# Patient Record
Sex: Male | Born: 1955
Health system: Southern US, Community
[De-identification: ages and names within clinical notes are randomized; demographics above are authoritative.]

## PROBLEM LIST (undated history)

## (undated) DIAGNOSIS — K5790 Diverticulosis of intestine, part unspecified, without perforation or abscess without bleeding: Secondary | ICD-10-CM

## (undated) DIAGNOSIS — E785 Hyperlipidemia, unspecified: Secondary | ICD-10-CM

## (undated) DIAGNOSIS — J45909 Unspecified asthma, uncomplicated: Secondary | ICD-10-CM

## (undated) DIAGNOSIS — Z86010 Personal history of colonic polyps: Secondary | ICD-10-CM

## (undated) DIAGNOSIS — Z860101 Personal history of adenomatous and serrated colon polyps: Secondary | ICD-10-CM

## (undated) DIAGNOSIS — Q140 Congenital malformation of vitreous humor: Secondary | ICD-10-CM

## (undated) DIAGNOSIS — I4891 Unspecified atrial fibrillation: Secondary | ICD-10-CM

## (undated) DIAGNOSIS — I10 Essential (primary) hypertension: Secondary | ICD-10-CM

## (undated) HISTORY — DX: Personal history of colonic polyps: Z86.010

## (undated) HISTORY — PX: COLONOSCOPY: SHX174

## (undated) HISTORY — DX: Diverticulosis of intestine, part unspecified, without perforation or abscess without bleeding: K57.90

## (undated) HISTORY — DX: Hyperlipidemia, unspecified: E78.5

## (undated) HISTORY — DX: Personal history of adenomatous and serrated colon polyps: Z86.0101

## (undated) HISTORY — DX: Unspecified atrial fibrillation: I48.91

## (undated) HISTORY — DX: Congenital malformation of vitreous humor: Q14.0

---

## 2004-08-06 ENCOUNTER — Encounter: Payer: Self-pay | Admitting: Internal Medicine

## 2005-08-30 ENCOUNTER — Ambulatory Visit: Payer: Self-pay | Admitting: Internal Medicine

## 2005-09-06 ENCOUNTER — Ambulatory Visit: Payer: Self-pay | Admitting: Internal Medicine

## 2005-09-23 ENCOUNTER — Ambulatory Visit: Payer: Self-pay | Admitting: Internal Medicine

## 2005-09-30 ENCOUNTER — Ambulatory Visit: Payer: Self-pay | Admitting: Internal Medicine

## 2006-09-15 ENCOUNTER — Ambulatory Visit: Payer: Self-pay | Admitting: Internal Medicine

## 2006-09-15 LAB — CONVERTED CEMR LAB
ALT: 32 units/L (ref 0–40)
Albumin: 4.5 g/dL (ref 3.5–5.2)
Alkaline Phosphatase: 53 units/L (ref 39–117)
BUN: 17 mg/dL (ref 6–23)
Basophils Absolute: 0 10*3/uL (ref 0.0–0.1)
Basophils Relative: 0.7 % (ref 0.0–1.0)
CO2: 30 meq/L (ref 19–32)
Calcium: 9.4 mg/dL (ref 8.4–10.5)
Chol/HDL Ratio, serum: 3.5
Cholesterol: 153 mg/dL (ref 0–200)
Glomerular Filtration Rate, Af Am: 91 mL/min/{1.73_m2}
Hemoglobin: 17.2 g/dL — ABNORMAL HIGH (ref 13.0–17.0)
Lymphocytes Relative: 28.2 % (ref 12.0–46.0)
Monocytes Relative: 9.5 % (ref 3.0–11.0)
Neutrophils Relative %: 59.1 % (ref 43.0–77.0)
PSA: 0.17 ng/mL (ref 0.10–4.00)
Platelets: 203 10*3/uL (ref 150–400)
Potassium: 3.9 meq/L (ref 3.5–5.1)
RDW: 12.2 % (ref 11.5–14.6)
Total Bilirubin: 1.3 mg/dL — ABNORMAL HIGH (ref 0.3–1.2)
Total Protein: 7 g/dL (ref 6.0–8.3)
WBC: 5.6 10*3/uL (ref 4.5–10.5)

## 2006-09-22 ENCOUNTER — Ambulatory Visit: Payer: Self-pay | Admitting: Internal Medicine

## 2007-06-23 DIAGNOSIS — E785 Hyperlipidemia, unspecified: Secondary | ICD-10-CM | POA: Insufficient documentation

## 2007-09-07 ENCOUNTER — Ambulatory Visit: Payer: Self-pay | Admitting: Internal Medicine

## 2007-09-07 LAB — CONVERTED CEMR LAB
AST: 24 units/L (ref 0–37)
Bilirubin Urine: NEGATIVE
Bilirubin, Direct: 0.2 mg/dL (ref 0.0–0.3)
Chloride: 108 meq/L (ref 96–112)
Cholesterol: 133 mg/dL (ref 0–200)
Eosinophils Absolute: 0.1 10*3/uL (ref 0.0–0.6)
Eosinophils Relative: 2 % (ref 0.0–5.0)
GFR calc Af Amer: 101 mL/min
GFR calc non Af Amer: 83 mL/min
Glucose, Bld: 90 mg/dL (ref 70–99)
Glucose, Urine, Semiquant: NEGATIVE
HCT: 48.7 % (ref 39.0–52.0)
Hemoglobin: 16.7 g/dL (ref 13.0–17.0)
Lymphocytes Relative: 27.2 % (ref 12.0–46.0)
MCV: 88.8 fL (ref 78.0–100.0)
Monocytes Absolute: 0.5 10*3/uL (ref 0.2–0.7)
Neutro Abs: 3 10*3/uL (ref 1.4–7.7)
Neutrophils Relative %: 60.8 % (ref 43.0–77.0)
PSA: 0.17 ng/mL (ref 0.10–4.00)
Potassium: 4.6 meq/L (ref 3.5–5.1)
Sodium: 142 meq/L (ref 135–145)
Specific Gravity, Urine: 1.02
WBC Urine, dipstick: NEGATIVE
WBC: 5 10*3/uL (ref 4.5–10.5)
pH: 7

## 2007-09-28 ENCOUNTER — Ambulatory Visit: Payer: Self-pay | Admitting: Internal Medicine

## 2007-09-28 DIAGNOSIS — K5730 Diverticulosis of large intestine without perforation or abscess without bleeding: Secondary | ICD-10-CM | POA: Insufficient documentation

## 2008-03-14 ENCOUNTER — Ambulatory Visit: Payer: Self-pay | Admitting: Internal Medicine

## 2008-03-14 DIAGNOSIS — T887XXA Unspecified adverse effect of drug or medicament, initial encounter: Secondary | ICD-10-CM | POA: Insufficient documentation

## 2008-03-14 LAB — CONVERTED CEMR LAB
AST: 21 units/L (ref 0–37)
Albumin: 3.9 g/dL (ref 3.5–5.2)
Alkaline Phosphatase: 42 units/L (ref 39–117)
Total Bilirubin: 1.2 mg/dL (ref 0.3–1.2)
Total CHOL/HDL Ratio: 4.4
Triglycerides: 84 mg/dL (ref 0–149)

## 2008-03-28 ENCOUNTER — Ambulatory Visit: Payer: Self-pay | Admitting: Internal Medicine

## 2008-09-26 ENCOUNTER — Ambulatory Visit: Payer: Self-pay | Admitting: Internal Medicine

## 2008-09-26 LAB — CONVERTED CEMR LAB
AST: 21 units/L (ref 0–37)
Alkaline Phosphatase: 43 units/L (ref 39–117)
Basophils Absolute: 0 10*3/uL (ref 0.0–0.1)
Bilirubin Urine: NEGATIVE
Blood in Urine, dipstick: NEGATIVE
Chloride: 107 meq/L (ref 96–112)
Cholesterol: 127 mg/dL (ref 0–200)
Creatinine, Ser: 0.9 mg/dL (ref 0.4–1.5)
Eosinophils Absolute: 0.1 10*3/uL (ref 0.0–0.7)
GFR calc non Af Amer: 94 mL/min
Glucose, Urine, Semiquant: NEGATIVE
HDL: 34.7 mg/dL — ABNORMAL LOW (ref >39.0)
MCHC: 33.9 g/dL (ref 30.0–36.0)
MCV: 90.6 fL (ref 78.0–100.0)
Neutrophils Relative %: 62.3 % (ref 43.0–77.0)
PSA: 0.15 ng/mL (ref 0.10–4.00)
Platelets: 144 10*3/uL — ABNORMAL LOW (ref 150–400)
Potassium: 4.7 meq/L (ref 3.5–5.1)
Protein, U semiquant: NEGATIVE
Total Bilirubin: 1.2 mg/dL (ref 0.3–1.2)
VLDL: 19 mg/dL (ref 0–40)
pH: 7.5

## 2008-10-03 ENCOUNTER — Ambulatory Visit: Payer: Self-pay | Admitting: Internal Medicine

## 2009-03-27 ENCOUNTER — Ambulatory Visit: Payer: Self-pay | Admitting: Internal Medicine

## 2009-03-27 LAB — CONVERTED CEMR LAB
AST: 23 units/L (ref 0–37)
Albumin: 3.9 g/dL (ref 3.5–5.2)
Cholesterol: 139 mg/dL (ref 0–200)
HDL: 44.8 mg/dL (ref >39.00)
Total CHOL/HDL Ratio: 3
Total Protein: 6.5 g/dL (ref 6.0–8.3)
Triglycerides: 83 mg/dL (ref 0.0–149.0)

## 2009-04-03 ENCOUNTER — Ambulatory Visit: Payer: Self-pay | Admitting: Internal Medicine

## 2009-04-03 LAB — CONVERTED CEMR LAB: Cholesterol, target level: 200 mg/dL

## 2009-08-28 ENCOUNTER — Emergency Department (HOSPITAL_COMMUNITY): Admission: EM | Admit: 2009-08-28 | Discharge: 2009-08-28 | Payer: Self-pay | Admitting: Family Medicine

## 2009-10-01 ENCOUNTER — Ambulatory Visit: Payer: Self-pay | Admitting: Internal Medicine

## 2009-10-01 LAB — CONVERTED CEMR LAB
Bilirubin, Direct: 0 mg/dL (ref 0.0–0.3)
Calcium: 9.3 mg/dL (ref 8.4–10.5)
Eosinophils Absolute: 0.1 10*3/uL (ref 0.0–0.7)
Eosinophils Relative: 2.4 % (ref 0.0–5.0)
GFR calc non Af Amer: 82.47 mL/min (ref >60)
Glucose, Urine, Semiquant: NEGATIVE
HCT: 47.3 % (ref 39.0–52.0)
HDL: 43.4 mg/dL (ref >39.00)
Hemoglobin: 16.2 g/dL (ref 13.0–17.0)
LDL Cholesterol: 88 mg/dL (ref 0–99)
Lymphocytes Relative: 23.9 % (ref 12.0–46.0)
Lymphs Abs: 1.1 10*3/uL (ref 0.7–4.0)
Monocytes Relative: 9 % (ref 3.0–12.0)
Neutro Abs: 3.2 10*3/uL (ref 1.4–7.7)
Nitrite: NEGATIVE
PSA: 0.19 ng/mL (ref 0.10–4.00)
Potassium: 4.6 meq/L (ref 3.5–5.1)
Protein, U semiquant: NEGATIVE
Sodium: 144 meq/L (ref 135–145)
Total CHOL/HDL Ratio: 3
Total Protein: 6.9 g/dL (ref 6.0–8.3)
Urobilinogen, UA: 0.2
VLDL: 19.2 mg/dL (ref 0.0–40.0)
WBC Urine, dipstick: NEGATIVE
WBC: 4.8 10*3/uL (ref 4.5–10.5)

## 2009-10-16 ENCOUNTER — Ambulatory Visit: Payer: Self-pay | Admitting: Internal Medicine

## 2009-10-16 DIAGNOSIS — Z8739 Personal history of other diseases of the musculoskeletal system and connective tissue: Secondary | ICD-10-CM

## 2009-10-30 ENCOUNTER — Telehealth: Payer: Self-pay | Admitting: Internal Medicine

## 2010-04-09 ENCOUNTER — Ambulatory Visit: Payer: Self-pay | Admitting: Internal Medicine

## 2010-04-09 LAB — CONVERTED CEMR LAB
ALT: 29 units/L (ref 0–53)
Alkaline Phosphatase: 48 units/L (ref 39–117)
Bilirubin, Direct: 0.2 mg/dL (ref 0.0–0.3)
Cholesterol: 149 mg/dL (ref 0–200)
Total Protein: 6.4 g/dL (ref 6.0–8.3)
VLDL: 22.6 mg/dL (ref 0.0–40.0)

## 2010-04-16 ENCOUNTER — Ambulatory Visit: Payer: Self-pay | Admitting: Internal Medicine

## 2010-10-14 ENCOUNTER — Ambulatory Visit: Payer: Self-pay | Admitting: Internal Medicine

## 2010-10-14 LAB — CONVERTED CEMR LAB
ALT: 26 units/L (ref 0–53)
AST: 20 units/L (ref 0–37)
Basophils Relative: 0.6 % (ref 0.0–3.0)
Bilirubin Urine: NEGATIVE
Bilirubin, Direct: 0.1 mg/dL (ref 0.0–0.3)
Chloride: 105 meq/L (ref 96–112)
Eosinophils Relative: 4.3 % (ref 0.0–5.0)
HCT: 47.8 % (ref 39.0–52.0)
LDL Cholesterol: 81 mg/dL (ref 0–99)
Lymphs Abs: 1.6 10*3/uL (ref 0.7–4.0)
MCV: 89 fL (ref 78.0–100.0)
Monocytes Absolute: 0.4 10*3/uL (ref 0.1–1.0)
Monocytes Relative: 7.7 % (ref 3.0–12.0)
Neutrophils Relative %: 58.4 % (ref 43.0–77.0)
Nitrite: NEGATIVE
PSA: 0.28 ng/mL (ref 0.10–4.00)
Potassium: 4.5 meq/L (ref 3.5–5.1)
RBC: 5.37 M/uL (ref 4.22–5.81)
Total CHOL/HDL Ratio: 4
Total Protein: 6.1 g/dL (ref 6.0–8.3)
Urobilinogen, UA: 0.2
WBC Urine, dipstick: NEGATIVE
WBC: 5.4 10*3/uL (ref 4.5–10.5)

## 2010-10-23 ENCOUNTER — Ambulatory Visit
Admission: RE | Admit: 2010-10-23 | Discharge: 2010-10-23 | Payer: Self-pay | Source: Home / Self Care | Attending: Internal Medicine | Admitting: Internal Medicine

## 2010-11-18 NOTE — Assessment & Plan Note (Signed)
Summary: 6 month rov/njr   Vital Signs:  Patient profile:   55 year old male Height:      76 inches Weight:      266 pounds Temp:     98.7 degrees F oral Pulse rate:   64 / minute Pulse rhythm:   regular BP sitting:   130 / 88  (left arm) Cuff size:   large  Vitals Entered By: Kern Reap CMA Duncan Dull) (April 16, 2010 8:53 AM) CC: follow-up visit, Lipid Management   Primary Care Provider:  Stacie Glaze MD  CC:  follow-up visit and Lipid Management.  History of Present Illness: follow up for lipids  Hyperlipidemia Follow-Up      This is a 55 year old man who presents for Hyperlipidemia follow-up.  The patient denies muscle aches, GI upset, abdominal pain, flushing, itching, constipation, diarrhea, and fatigue.  The patient denies the following symptoms: chest pain/pressure, exercise intolerance, dypsnea, palpitations, syncope, and pedal edema.  Compliance with medications (by patient report) has been near 100%.  Dietary compliance has been good.  The patient reports exercising 3-4X per week.  Adjunctive measures currently used by the patient include limiting alcohol consumpton.      Lipid Management History:      Positive NCEP/ATP III risk factors include male age 58 years old or older.  Negative NCEP/ATP III risk factors include no family history for ischemic heart disease and non-tobacco-user status.     Problems Prior to Update: 1)  Calcium Pyrophosphate Deposition Disease  (ICD-275.49) 2)  Uns Advrs Eff Uns Rx Medicinal&biological Sbstnc  (ICD-995.20) 3)  Diverticulosis, Colon  (ICD-562.10) 4)  Family History of Asthma  (ICD-V17.5) 5)  Routine General Medical Exam@[facility]  (ICD-V70.0) 6)  Hyperlipidemia  (ICD-272.4)  Medications Prior to Update: 1)  Lipitor 20 Mg  Tabs (Atorvastatin Calcium) .... One By Mouth Q Daily 2)  Bayer Aspirin 325 Mg Tabs (Aspirin) .... Two By Mouth Two Times A Day 3)  Colchicine 0.6 Mg Tabs (Colchicine) .... One By Mouth Two Times A  Day Prn  Current Medications (verified): 1)  Lipitor 20 Mg  Tabs (Atorvastatin Calcium) .... One By Mouth Q Daily 2)  Bayer Aspirin 325 Mg Tabs (Aspirin) .... Two By Mouth Two Times A Day 3)  Colcrys 0.6 Mg Tabs (Colchicine) .... One By Mouth Bid  Allergies: 1)  ! * Eggs  Past History:  Family History: Last updated: 09/28/2007 father brain tumor mother Family History of Asthma  Social History: Last updated: 09/28/2007 Occupation: Married  Risk Factors: Smoking Status: never (10/16/2009) Passive Smoke Exposure: no (10/16/2009)  Past medical, surgical, family and social histories (including risk factors) reviewed, and no changes noted (except as noted below).  Past Medical History: Reviewed history from 09/28/2007 and no changes required. Hyperlipidemia allergic to eggs ? no vaccines Diverticulosis, colon  Past Surgical History: Reviewed history from 09/28/2007 and no changes required. colonoscopy  Family History: Reviewed history from 09/28/2007 and no changes required. father brain tumor mother Family History of Asthma  Social History: Reviewed history from 09/28/2007 and no changes required. Occupation: Married  Review of Systems  The patient denies anorexia, fever, weight loss, weight gain, vision loss, decreased hearing, hoarseness, chest pain, syncope, dyspnea on exertion, peripheral edema, prolonged cough, headaches, hemoptysis, abdominal pain, melena, hematochezia, severe indigestion/heartburn, hematuria, incontinence, genital sores, muscle weakness, suspicious skin lesions, transient blindness, difficulty walking, depression, unusual weight change, abnormal bleeding, enlarged lymph nodes, angioedema, and breast masses.    Physical  Exam  General:  Well-developed,well-nourished,in no acute distress; alert,appropriate and cooperative throughout examination Head:  Normocephalic and atraumatic without obvious abnormalities. No apparent alopecia or  balding. Eyes:  pupils equal and pupils reactive to light.   Ears:  R ear normal and L ear normal.   Nose:  External nasal examination shows no deformity or inflammation. Nasal mucosa are pink and moist without lesions or exudates. Mouth:  Oral mucosa and oropharynx without lesions or exudates.  Teeth in good repair. Neck:  No deformities, masses, or tenderness noted. Lungs:  Normal respiratory effort, chest expands symmetrically. Lungs are clear to auscultation, no crackles or wheezes. Heart:  normal rate, regular rhythm, and no murmur.   Abdomen:  Bowel sounds positive,abdomen soft and non-tender without masses, organomegaly or hernias noted. Msk:  No deformity or scoliosis noted of thoracic or lumbar spine.  redness over joint.   Extremities:  No clubbing, cyanosis, edema, or deformity noted with normal full range of motion of all joints.   Neurologic:  No cranial nerve deficits noted. Station and gait are normal. Plantar reflexes are down-going bilaterally. DTRs are symmetrical throughout. Sensory, motor and coordinative functions appear intact.   Impression & Recommendations:  Problem # 1:  HYPERLIPIDEMIA (ICD-272.4)  His updated medication list for this problem includes:    Lipitor 20 Mg Tabs (Atorvastatin calcium) ..... One by mouth q daily  Labs Reviewed: SGOT: 22 (10/01/2009)   SGPT: 31 (10/01/2009)  Lipid Goals: Chol Goal: 200 (04/03/2009)   HDL Goal: 40 (04/03/2009)   LDL Goal: 160 (04/03/2009)   TG Goal: 150 (04/03/2009)  Prior 10 Yr Risk Heart Disease: 4 % (04/03/2009)   HDL:43.40 (10/01/2009), 44.80 (03/27/2009)  LDL:88 (10/01/2009), 78 (03/27/2009)  Chol:151 (10/01/2009), 139 (03/27/2009)  Trig:96.0 (10/01/2009), 83.0 (03/27/2009)  Problem # 2:  CALCIUM PYROPHOSPHATE DEPOSITION DISEASE (ICD-275.49) no recurrent  Problem # 3:  HYPERLIPIDEMIA (ICD-272.4)  His updated medication list for this problem includes:    Lipitor 20 Mg Tabs (Atorvastatin calcium) ..... One by  mouth q daily  Complete Medication List: 1)  Lipitor 20 Mg Tabs (Atorvastatin calcium) .... One by mouth q daily 2)  Bayer Aspirin 325 Mg Tabs (Aspirin) .... Two by mouth two times a day 3)  Colcrys 0.6 Mg Tabs (Colchicine) .... One by mouth bid  Lipid Assessment/Plan:      Based on NCEP/ATP III, the patient's risk factor category is "0-1 risk factors".  The patient's lipid goals are as follows: Total cholesterol goal is 200; LDL cholesterol goal is 160; HDL cholesterol goal is 40; Triglyceride goal is 150.  His LDL cholesterol goal has been met.    Patient Instructions: 1)  Dec  2011 Prescriptions: COLCRYS 0.6 MG TABS (COLCHICINE) one by mouth BID  #30 x 4   Entered and Authorized by:   Stacie Glaze MD   Signed by:   Stacie Glaze MD on 04/16/2010   Method used:   Print then Give to Patient   RxID:   [id number]

## 2010-11-18 NOTE — Progress Notes (Signed)
Summary: Discount card  Phone Note Call from Patient Call back at Home Phone [phone]   Caller: Patient-live call Reason for Call: Acute Illness, Talk to Nurse Summary of Call: was seen a few weeks ago. Dr Lovell Sheehan told pt to call back to get a discount card for his Lipitior. Please call pt when ready. Initial call taken by: Warnell Forester,  October 30, 2009 10:50 AM  Follow-up for Phone Call        PT INFORMED CAN GET ON LINE Follow-up by: Willy Eddy, LPN,  October 30, 2009 10:53 AM

## 2010-11-19 NOTE — Assessment & Plan Note (Signed)
Summary: cpx/njr rsc bmp/njr   Vital Signs:  Patient profile:   55 year old male Height:      76 inches Weight:      272 pounds BMI:     33.23 Temp:     98.2 degrees F oral Pulse rate:   68 / minute Resp:     14 per minute BP sitting:   130 / 82  (left arm)  Vitals Entered By: Willy Eddy, LPN (October 23, 2010 12:28 PM)  Contraindications/Deferment of Procedures/Staging:    Test/Procedure: FLU VAX    Reason for deferment: patient declined  CC: cpx, Lipid Management Is Patient Diabetic? No   Primary Care Provider:  Stacie Glaze MD  CC:  cpx and Lipid Management.  History of Present Illness: The pt was asked about all immunizations, health maint. services that are appropriate to their age and was given guidance on diet exercize  and weight management   Lipid Management History:      Positive NCEP/ATP III risk factors include male age 40 years old or older and HDL cholesterol less than 40.  Negative NCEP/ATP III risk factors include no family history for ischemic heart disease and non-tobacco-user status.     Preventive Screening-Counseling & Management  Alcohol-Tobacco     Smoking Status: never     Passive Smoke Exposure: no  Problems Prior to Update: 1)  Calcium Pyrophosphate Deposition Disease  (ICD-275.49) 2)  Uns Advrs Eff Uns Rx Medicinal&biological Sbstnc  (ICD-995.20) 3)  Diverticulosis, Colon  (ICD-562.10) 4)  Family History of Asthma  (ICD-V17.5) 5)  Routine General Medical Exam@[facility]  (ICD-V70.0) 6)  Hyperlipidemia  (ICD-272.4)  Current Problems (verified): 1)  Calcium Pyrophosphate Deposition Disease  (ICD-275.49) 2)  Uns Advrs Eff Uns Rx Medicinal&biological Sbstnc  (ICD-995.20) 3)  Diverticulosis, Colon  (ICD-562.10) 4)  Family History of Asthma  (ICD-V17.5) 5)  Routine General Medical Exam@[facility]  (ICD-V70.0) 6)  Hyperlipidemia  (ICD-272.4)  Medications Prior to Update: 1)  Lipitor 20 Mg  Tabs (Atorvastatin Calcium)  .... One By Mouth Q Daily 2)  Bayer Aspirin 325 Mg Tabs (Aspirin) .... Two By Mouth Two Times A Day 3)  Colcrys 0.6 Mg Tabs (Colchicine) .... One By Mouth Bid  Current Medications (verified): 1)  Lipitor 10 Mg Tabs (Atorvastatin Calcium) .... Generic One By Mouth Daily 2)  Bayer Aspirin 325 Mg Tabs (Aspirin) .... Two By Mouth Two Times A Day 3)  Colcrys 0.6 Mg Tabs (Colchicine) .... One By Mouth Two Times A Day As Needed Gouit Flare Up  Allergies (verified): 1)  ! * Eggs  Past History:  Past Medical History: Last updated: 09/28/2007 Hyperlipidemia allergic to eggs ? no vaccines Diverticulosis, colon  Past Surgical History: Last updated: 09/28/2007 colonoscopy  Family History: Last updated: 09/28/2007 father brain tumor mother Family History of Asthma  Social History: Last updated: 09/28/2007 Occupation: Married  Risk Factors: Smoking Status: never (10/23/2010) Passive Smoke Exposure: no (10/23/2010) PMH-FH-SH reviewed-no changes except otherwise noted  Family History: Reviewed history from 09/28/2007 and no changes required. father brain tumor mother Family History of Asthma  Social History: Reviewed history from 09/28/2007 and no changes required. Occupation: Married  Review of Systems  The patient denies anorexia, fever, weight loss, weight gain, vision loss, decreased hearing, hoarseness, chest pain, syncope, dyspnea on exertion, peripheral edema, prolonged cough, headaches, hemoptysis, abdominal pain, melena, hematochezia, severe indigestion/heartburn, hematuria, incontinence, genital sores, muscle weakness, suspicious skin lesions, transient blindness, difficulty walking, depression,  unusual weight change, abnormal bleeding, enlarged lymph nodes, angioedema, and breast masses.    Physical Exam  General:  Well-developed,well-nourished,in no acute distress; alert,appropriate and cooperative throughout examination Head:  Normocephalic and atraumatic without  obvious abnormalities. No apparent alopecia or balding. Eyes:  pupils equal and pupils reactive to light.   Ears:  R ear normal and L ear normal.   Nose:  External nasal examination shows no deformity or inflammation. Nasal mucosa are pink and moist without lesions or exudates. Mouth:  Oral mucosa and oropharynx without lesions or exudates.  Teeth in good repair. Neck:  No deformities, masses, or tenderness noted. Lungs:  Normal respiratory effort, chest expands symmetrically. Lungs are clear to auscultation, no crackles or wheezes. Heart:  normal rate, regular rhythm, and no murmur.   Abdomen:  Bowel sounds positive,abdomen soft and non-tender without masses, organomegaly or hernias noted. Msk:  No deformity or scoliosis noted of thoracic or lumbar spine.  redness over joint.   Pulses:  R and L carotid,radial,femoral,dorsalis pedis and posterior tibial pulses are full and equal bilaterally Extremities:  No clubbing, cyanosis, edema, or deformity noted with normal full range of motion of all joints.   Neurologic:  No cranial nerve deficits noted. Station and gait are normal. Plantar reflexes are down-going bilaterally. DTRs are symmetrical throughout. Sensory, motor and coordinative functions appear intact.   Impression & Recommendations:  Problem # 1:  HYPERLIPIDEMIA (ICD-272.4) Assessment Unchanged  His updated medication list for this problem includes:    Lipitor 10 Mg Tabs (Atorvastatin calcium) .Marland Kitchen... Generic one by mouth daily  Labs Reviewed: SGOT: 20 (10/14/2010)   SGPT: 26 (10/14/2010)  Lipid Goals: Chol Goal: 200 (04/03/2009)   HDL Goal: 40 (04/03/2009)   LDL Goal: 160 (04/03/2009)   TG Goal: 150 (04/03/2009)  Prior 10 Yr Risk Heart Disease: 6 % (04/16/2010)   HDL:38.40 (10/14/2010), 44.30 (04/09/2010)  LDL:81 (10/14/2010), 82 (04/09/2010)  Chol:142 (10/14/2010), 149 (04/09/2010)  Trig:112.0 (10/14/2010), 113.0 (04/09/2010)  Problem # 2:  ROUTINE GENERAL MEDICAL EXAM@[facility] (ICD-V70.0) Assessment: Unchanged  Colonoscopy: Diverticulosis (09/30/2005) Td Booster: Tdap (10/03/2008)   Chol: 142 (10/14/2010)   HDL: 38.40 (10/14/2010)   LDL: 81 (10/14/2010)   TG: 112.0 (10/14/2010) TSH: 1.52 (10/14/2010)   PSA: 0.28 (10/14/2010) Next Colonoscopy due:: 09/2015 (04/03/2009)  Discussed using sunscreen, use of alcohol, drug use, self testicular exam, routine dental care, routine eye care, routine physical exam, seat belts, multiple vitamins, osteoporosis prevention, adequate calcium intake in diet, and recommendations for immunizations.  Discussed exercise and checking cholesterol.  Discussed gun safety, safe sex, and contraception. Also recommend checking PSA.  Complete Medication List: 1)  Lipitor 10 Mg Tabs (Atorvastatin calcium) .... Generic one by mouth daily 2)  Bayer Aspirin 325 Mg Tabs (Aspirin) .... Two by mouth two times a day 3)  Colcrys 0.6 Mg Tabs (Colchicine) .... One by mouth two times a day as needed gouit flare up  Lipid Assessment/Plan:      Based on NCEP/ATP III, the patient's risk factor category is "2 or more risk factors and a calculated 10 year CAD risk of < 20%".  The patient's lipid goals are as follows: Total cholesterol goal is 200; LDL cholesterol goal is 160; HDL cholesterol goal is 40; Triglyceride goal is 150.  His LDL cholesterol goal has been met.    Patient Instructions: 1)  weight loss key 2)  goal   under 250 by next OV! 3)  Please schedule a follow-up appointment in 4 months. Prescriptions:  LIPITOR 10 MG TABS (ATORVASTATIN CALCIUM) generic one by mouth daily  #30 x 11   Entered and Authorized by:   Stacie Glaze MD   Signed by:   Stacie Glaze MD on 10/23/2010   Method used:   Electronically to        [facility]* (retail)       [address]       [address] Shipping/mailing       [place], [place]  [address]       Ph: [phone]       Fax: [phone]   RxID:   [id number]    Orders  Added: 1)  Est. Patient 40-64 years [id number]

## 2011-01-20 LAB — SYNOVIAL CELL COUNT + DIFF, W/ CRYSTALS
Neutrophil, Synovial: 80 % — ABNORMAL HIGH (ref 0–25)
WBC, Synovial: 1010 /mm3 — ABNORMAL HIGH (ref 0–200)

## 2011-01-20 LAB — BODY FLUID CELL COUNT WITH DIFFERENTIAL
Eos, Fluid: UNDETERMINED %
Lymphs, Fluid: UNDETERMINED %
Monocyte-Macrophage-Serous Fluid: UNDETERMINED % (ref 50–90)
Total Nucleated Cell Count, Fluid: UNDETERMINED cu mm (ref 0–1000)

## 2011-01-20 LAB — GRAM STAIN

## 2011-02-17 ENCOUNTER — Encounter: Payer: Self-pay | Admitting: Internal Medicine

## 2011-02-24 ENCOUNTER — Ambulatory Visit: Payer: Self-pay | Admitting: Internal Medicine

## 2011-03-24 ENCOUNTER — Ambulatory Visit: Payer: Self-pay | Admitting: Internal Medicine

## 2011-03-26 ENCOUNTER — Encounter: Payer: Self-pay | Admitting: Internal Medicine

## 2011-03-26 ENCOUNTER — Ambulatory Visit (INDEPENDENT_AMBULATORY_CARE_PROVIDER_SITE_OTHER): Payer: 59 | Admitting: Internal Medicine

## 2011-03-26 VITALS — BP 132/80 | HR 72 | Temp 98.2°F | Resp 16 | Ht 76.0 in | Wt 258.0 lb

## 2011-03-26 DIAGNOSIS — Z0000 Encounter for general adult medical examination without abnormal findings: Secondary | ICD-10-CM

## 2011-03-26 DIAGNOSIS — T887XXA Unspecified adverse effect of drug or medicament, initial encounter: Secondary | ICD-10-CM

## 2011-03-26 DIAGNOSIS — M62830 Muscle spasm of back: Secondary | ICD-10-CM

## 2011-03-26 DIAGNOSIS — M5380 Other specified dorsopathies, site unspecified: Secondary | ICD-10-CM

## 2011-03-26 DIAGNOSIS — E785 Hyperlipidemia, unspecified: Secondary | ICD-10-CM

## 2011-03-26 MED ORDER — MELOXICAM 15 MG PO TABS: 15.0000 mg | ORAL_TABLET | Freq: Every day | ORAL | Status: DC

## 2011-03-26 MED ORDER — ATORVASTATIN CALCIUM 10 MG PO TABS: 10.0000 mg | ORAL_TABLET | Freq: Every day | ORAL | Status: DC

## 2011-03-26 MED ORDER — CYCLOBENZAPRINE HCL 5 MG PO TABS: 5.0000 mg | ORAL_TABLET | Freq: Three times a day (TID) | ORAL | Status: AC | PRN

## 2011-03-26 NOTE — Progress Notes (Signed)
  Subjective:    Patient ID: [name] Aguilar, male    DOB: 26-Apr-1956, 55 y.o.   MRN: [id number]  HPIlost 14 lbs with weight loss plan Lipid management Picked up a large dog with back pan and spasm Taking asa 650 BID Still with pain at night and Am stiffness Acute injury less that one week     Review of Systems  Constitutional: Negative for fever and fatigue.  HENT: Negative for hearing loss, congestion, neck pain and postnasal drip.   Eyes: Negative for discharge, redness and visual disturbance.  Respiratory: Negative for cough, shortness of breath and wheezing.   Cardiovascular: Negative for leg swelling.  Gastrointestinal: Negative for abdominal pain, constipation and abdominal distention.  Genitourinary: Negative for urgency and frequency.  Musculoskeletal: Negative for joint swelling and arthralgias.  Skin: Negative for color change and rash.  Neurological: Negative for weakness and light-headedness.  Hematological: Negative for adenopathy.  Psychiatric/Behavioral: Negative for behavioral problems.   Past Medical History  Diagnosis Date  . Hyperlipidemia   . Diverticulosis    Past Surgical History  Procedure Date  . Colonoscopy     does not have a smoking history on file. He does not have any smokeless tobacco history on file. He reports that he does not drink alcohol or use illicit drugs. family history includes Asthma in his mother and Brain cancer in his father. Allergies  Allergen Reactions  . Eggs Or Egg-Derived Products     REACTION: no vaccines       Objective:   Physical Exam  Constitutional: He appears well-developed and well-nourished.  HENT:  Head: Normocephalic and atraumatic.  Eyes: Conjunctivae are normal. Pupils are equal, round, and reactive to light.  Neck: Normal range of motion. Neck supple.  Cardiovascular: Normal rate and regular rhythm.   Pulmonary/Chest: Effort normal and breath sounds normal.  Abdominal: Soft. Bowel sounds are normal.   Musculoskeletal: Normal range of motion.          Assessment & Plan:

## 2011-10-20 ENCOUNTER — Other Ambulatory Visit (INDEPENDENT_AMBULATORY_CARE_PROVIDER_SITE_OTHER): Payer: 59

## 2011-10-20 DIAGNOSIS — Z0000 Encounter for general adult medical examination without abnormal findings: Secondary | ICD-10-CM

## 2011-10-20 LAB — LIPID PANEL
Cholesterol: 164 mg/dL (ref 0–200)
LDL Cholesterol: 96 mg/dL (ref 0–99)

## 2011-10-20 LAB — CBC WITH DIFFERENTIAL/PLATELET
Basophils Relative: 0.6 % (ref 0.0–3.0)
Eosinophils Absolute: 0 10*3/uL (ref 0.0–0.7)
Eosinophils Relative: 0 % (ref 0.0–5.0)
Hemoglobin: 16.6 g/dL (ref 13.0–17.0)
Lymphocytes Relative: 23.8 % (ref 12.0–46.0)
MCHC: 33.4 g/dL (ref 30.0–36.0)
MCV: 91.4 fl (ref 78.0–100.0)
Neutro Abs: 4 10*3/uL (ref 1.4–7.7)
RBC: 5.45 Mil/uL (ref 4.22–5.81)
WBC: 5.8 10*3/uL (ref 4.5–10.5)

## 2011-10-20 LAB — POCT URINALYSIS DIPSTICK
Bilirubin, UA: NEGATIVE
Glucose, UA: NEGATIVE
Ketones, UA: NEGATIVE
Leukocytes, UA: NEGATIVE
Nitrite, UA: NEGATIVE
pH, UA: 6.5

## 2011-10-20 LAB — HEPATIC FUNCTION PANEL
ALT: 26 U/L (ref 0–53)
AST: 19 U/L (ref 0–37)
Albumin: 3.8 g/dL (ref 3.5–5.2)
Alkaline Phosphatase: 45 U/L (ref 39–117)

## 2011-10-20 LAB — BASIC METABOLIC PANEL
BUN: 21 mg/dL (ref 6–23)
Chloride: 106 mEq/L (ref 96–112)
Glucose, Bld: 90 mg/dL (ref 70–99)
Potassium: 4.5 mEq/L (ref 3.5–5.1)

## 2011-10-20 LAB — PSA: PSA: 0.16 ng/mL (ref 0.10–4.00)

## 2011-10-27 ENCOUNTER — Ambulatory Visit (INDEPENDENT_AMBULATORY_CARE_PROVIDER_SITE_OTHER): Payer: 59 | Admitting: Internal Medicine

## 2011-10-27 ENCOUNTER — Encounter: Payer: Self-pay | Admitting: Internal Medicine

## 2011-10-27 DIAGNOSIS — R635 Abnormal weight gain: Secondary | ICD-10-CM

## 2011-10-27 DIAGNOSIS — L570 Actinic keratosis: Secondary | ICD-10-CM

## 2011-10-27 DIAGNOSIS — Z0000 Encounter for general adult medical examination without abnormal findings: Secondary | ICD-10-CM

## 2011-10-27 NOTE — Patient Instructions (Signed)
The patient is instructed to continue all medications as prescribed. Schedule followup with check out clerk upon leaving the clinic  

## 2011-10-27 NOTE — Progress Notes (Signed)
Subjective:    Patient ID: [name] Aguilar, male    DOB: July 21, 1956, 56 y.o.   MRN: [id number]  HPI CPX Weight gain and increase lipids New lesions on cheek    Review of Systems  Constitutional: Negative for fever and fatigue.  HENT: Negative for hearing loss, congestion, neck pain and postnasal drip.   Eyes: Negative for discharge, redness and visual disturbance.  Respiratory: Negative for cough, shortness of breath and wheezing.   Cardiovascular: Negative for leg swelling.  Gastrointestinal: Negative for abdominal pain, constipation and abdominal distention.  Genitourinary: Negative for urgency and frequency.  Musculoskeletal: Negative for joint swelling and arthralgias.  Skin: Negative for color change and rash.  Neurological: Negative for weakness and light-headedness.  Hematological: Negative for adenopathy.  Psychiatric/Behavioral: Negative for behavioral problems.   Past Medical History  Diagnosis Date  . Hyperlipidemia   . Diverticulosis     History   Social History  . Marital Status: Married    Spouse Name: N/A    Number of Children: N/A  . Years of Education: N/A   Occupational History  . Not on file.   Social History Main Topics  . Smoking status: Never Smoker   . Smokeless tobacco: Never Used  . Alcohol Use: No  . Drug Use: No  . Sexually Active: Yes   Other Topics Concern  . Not on file   Social History Narrative  . No narrative on file    Past Surgical History  Procedure Date  . Colonoscopy     Family History  Problem Relation Age of Onset  . Asthma Mother   . Brain cancer Father     Allergies  Allergen Reactions  . Eggs Or Egg-Derived Products     REACTION: no vaccines    Current Outpatient Prescriptions on File Prior to Visit  Medication Sig Dispense Refill  . aspirin 325 MG tablet Take 325 mg by mouth. Take 2 pills twice a day       . atorvastatin (LIPITOR) 10 MG tablet Take 1 tablet (10 mg total) by mouth daily. generic   90 tablet  3  . colchicine 0.6 MG tablet Take 0.6 mg by mouth 2 (two) times daily as needed.        . cyclobenzaprine (FLEXERIL) 5 MG tablet Take 1 tablet (5 mg total) by mouth every 8 (eight) hours as needed for muscle spasms.  30 tablet  1    BP 130/84  Pulse 60  Temp 98.1 F (36.7 C)  Resp 16  Ht 6\' 4"  (1.93 m)  Wt 268 lb (121.564 kg)  BMI 32.62 kg/m2       Objective:   Physical Exam  Nursing note and vitals reviewed. Constitutional: He is oriented to person, place, and time. He appears well-developed and well-nourished.  HENT:  Head: Normocephalic and atraumatic.  Eyes: Conjunctivae are normal. Pupils are equal, round, and reactive to light.  Neck: Normal range of motion. Neck supple.  Cardiovascular: Normal rate and regular rhythm.   Pulmonary/Chest: Effort normal and breath sounds normal.  Abdominal: Soft. Bowel sounds are normal.  Genitourinary: Rectum normal and prostate normal.  Musculoskeletal: Normal range of motion. He exhibits no edema and no tenderness.  Neurological: He is alert and oriented to person, place, and time. He has normal reflexes.  Skin: Skin is warm and dry. Rash noted.  Psychiatric: He has a normal mood and affect. His behavior is normal.          Assessment &  Plan:   Patient presents for yearly preventative medicine examination.   all immunizations and health maintenance protocols were reviewed with the patient and they are up to date with these protocols.   screening laboratory values were reviewed with the patient including screening of hyperlipidemia PSA renal function and hepatic function.   There medications past medical history social history problem list and allergies were reviewed in detail.   Goals were established with regard to weight loss exercise diet in compliance with medications  Discussion of weight gain and plan for dietary accident 10-15 pounds this weight loss before next office visit a re\re monitoring of cholesterol  that time. Both temples it appears to be actinic keratoses cryotherapy was initiated cryotherapy  Informed consent was obtained in the lesion on his right temple was treated for 60 seconds of liquid nitrogen application the patient tolerated the procedure well as procedural care was discussed with the patient and instructions should the lesion reappears contact our office immediately

## 2012-04-06 ENCOUNTER — Other Ambulatory Visit: Payer: Self-pay | Admitting: *Deleted

## 2012-04-06 DIAGNOSIS — E785 Hyperlipidemia, unspecified: Secondary | ICD-10-CM

## 2012-04-19 ENCOUNTER — Other Ambulatory Visit: Payer: 59

## 2012-04-26 ENCOUNTER — Ambulatory Visit: Payer: 59 | Admitting: Internal Medicine

## 2012-05-24 ENCOUNTER — Other Ambulatory Visit: Payer: Self-pay | Admitting: Internal Medicine

## 2012-07-05 ENCOUNTER — Other Ambulatory Visit: Payer: 59

## 2012-07-14 ENCOUNTER — Ambulatory Visit: Payer: 59 | Admitting: Internal Medicine

## 2012-08-11 ENCOUNTER — Other Ambulatory Visit (INDEPENDENT_AMBULATORY_CARE_PROVIDER_SITE_OTHER): Payer: 59

## 2012-08-11 DIAGNOSIS — E785 Hyperlipidemia, unspecified: Secondary | ICD-10-CM

## 2012-08-11 LAB — LIPID PANEL
Cholesterol: 143 mg/dL (ref 0–200)
HDL: 41.5 mg/dL (ref >39.00)
Triglycerides: 65 mg/dL (ref 0.0–149.0)
VLDL: 13 mg/dL (ref 0.0–40.0)

## 2012-08-11 LAB — HEPATIC FUNCTION PANEL
ALT: 30 U/L (ref 0–53)
AST: 20 U/L (ref 0–37)
Albumin: 3.6 g/dL (ref 3.5–5.2)
Total Protein: 6.3 g/dL (ref 6.0–8.3)

## 2012-08-23 ENCOUNTER — Ambulatory Visit (INDEPENDENT_AMBULATORY_CARE_PROVIDER_SITE_OTHER): Payer: 59 | Admitting: Internal Medicine

## 2012-08-23 VITALS — BP 106/74 | HR 72 | Temp 98.0°F | Resp 16 | Ht 76.0 in | Wt 262.0 lb

## 2012-08-23 DIAGNOSIS — E785 Hyperlipidemia, unspecified: Secondary | ICD-10-CM

## 2012-08-23 DIAGNOSIS — M109 Gout, unspecified: Secondary | ICD-10-CM

## 2012-08-23 NOTE — Patient Instructions (Signed)
Practical paleo 

## 2012-10-18 DIAGNOSIS — Q140 Congenital malformation of vitreous humor: Secondary | ICD-10-CM

## 2012-10-18 HISTORY — PX: EYE SURGERY: SHX253

## 2012-10-18 HISTORY — DX: Congenital malformation of vitreous humor: Q14.0

## 2012-10-30 ENCOUNTER — Encounter: Payer: Self-pay | Admitting: Internal Medicine

## 2012-10-30 NOTE — Progress Notes (Signed)
  Subjective:    Patient ID: [name] Aguilar, male    DOB: 06-17-1956, 57 y.o.   MRN: [id number]  HPI Follow up for lipitor use for elevated lipids and review of gouty arthritis Frequency of gout low Primary joint great toe    Review of Systems  Constitutional: Negative for fever and fatigue.  HENT: Negative for hearing loss, congestion, neck pain and postnasal drip.   Eyes: Negative for discharge, redness and visual disturbance.  Respiratory: Negative for cough, shortness of breath and wheezing.   Cardiovascular: Negative for leg swelling.  Gastrointestinal: Negative for abdominal pain, constipation and abdominal distention.  Genitourinary: Negative for urgency and frequency.  Musculoskeletal: Negative for joint swelling and arthralgias.  Skin: Negative for color change and rash.  Neurological: Negative for weakness and light-headedness.  Hematological: Negative for adenopathy.  Psychiatric/Behavioral: Negative for behavioral problems.       Objective:   Physical Exam  Nursing note and vitals reviewed. Constitutional: He appears well-developed and well-nourished.  HENT:  Head: Normocephalic and atraumatic.  Eyes: Conjunctivae normal are normal. Pupils are equal, round, and reactive to light.  Neck: Normal range of motion. Neck supple.  Cardiovascular: Normal rate and regular rhythm.   Pulmonary/Chest: Effort normal and breath sounds normal.  Abdominal: Soft. Bowel sounds are normal.          Assessment & Plan:  monitoring uric acid to inform decision about allopurinol monitor and set goal fo lipid

## 2012-12-18 ENCOUNTER — Other Ambulatory Visit: Payer: Self-pay | Admitting: Internal Medicine

## 2013-02-23 ENCOUNTER — Other Ambulatory Visit (INDEPENDENT_AMBULATORY_CARE_PROVIDER_SITE_OTHER): Payer: 59

## 2013-02-23 DIAGNOSIS — Z0000 Encounter for general adult medical examination without abnormal findings: Secondary | ICD-10-CM

## 2013-02-23 LAB — POCT URINALYSIS DIPSTICK
Bilirubin, UA: NEGATIVE
Leukocytes, UA: NEGATIVE
Nitrite, UA: NEGATIVE
Protein, UA: NEGATIVE
Urobilinogen, UA: 0.2
pH, UA: 7

## 2013-02-23 LAB — CBC WITH DIFFERENTIAL/PLATELET
Basophils Relative: 0.7 % (ref 0.0–3.0)
Eosinophils Absolute: 0 10*3/uL (ref 0.0–0.7)
MCHC: 34.1 g/dL (ref 30.0–36.0)
MCV: 88.8 fl (ref 78.0–100.0)
Monocytes Absolute: 0.4 10*3/uL (ref 0.1–1.0)
Neutrophils Relative %: 66.6 % (ref 43.0–77.0)
Platelets: 165 10*3/uL (ref 150.0–400.0)
RBC: 5.38 Mil/uL (ref 4.22–5.81)

## 2013-02-23 LAB — TSH: TSH: 1.31 u[IU]/mL (ref 0.35–5.50)

## 2013-02-23 LAB — BASIC METABOLIC PANEL
BUN: 18 mg/dL (ref 6–23)
CO2: 26 mEq/L (ref 19–32)
Creatinine, Ser: 1 mg/dL (ref 0.4–1.5)
Potassium: 4.1 mEq/L (ref 3.5–5.1)
Sodium: 139 mEq/L (ref 135–145)

## 2013-02-23 LAB — LIPID PANEL: HDL: 40.6 mg/dL (ref >39.00)

## 2013-02-23 LAB — HEPATIC FUNCTION PANEL
Bilirubin, Direct: 0.2 mg/dL (ref 0.0–0.3)
Total Protein: 6 g/dL (ref 6.0–8.3)

## 2013-02-23 LAB — PSA: PSA: 0.16 ng/mL (ref 0.10–4.00)

## 2013-03-07 ENCOUNTER — Encounter: Payer: Self-pay | Admitting: Internal Medicine

## 2013-03-07 ENCOUNTER — Ambulatory Visit (INDEPENDENT_AMBULATORY_CARE_PROVIDER_SITE_OTHER): Payer: 59 | Admitting: Internal Medicine

## 2013-03-07 VITALS — BP 120/80 | HR 68 | Temp 98.2°F | Resp 16 | Ht 76.0 in | Wt 270.0 lb

## 2013-03-07 DIAGNOSIS — Z0000 Encounter for general adult medical examination without abnormal findings: Secondary | ICD-10-CM

## 2013-03-07 NOTE — Progress Notes (Signed)
Subjective:    Patient ID: [name] Aguilar, male    DOB: 1956/02/03, 57 y.o.   MRN: [id number]  HPI CPX Weight management Labs review    Review of Systems  Constitutional: Negative for fever and fatigue.  HENT: Negative for hearing loss, congestion, neck pain and postnasal drip.   Eyes: Negative for discharge, redness and visual disturbance.  Respiratory: Negative for cough, shortness of breath and wheezing.   Cardiovascular: Negative for leg swelling.  Gastrointestinal: Negative for abdominal pain, constipation and abdominal distention.  Genitourinary: Negative for urgency and frequency.  Musculoskeletal: Negative for joint swelling and arthralgias.  Skin: Negative for color change and rash.  Neurological: Negative for weakness and light-headedness.  Hematological: Negative for adenopathy.  Psychiatric/Behavioral: Negative for behavioral problems.   Past Medical History  Diagnosis Date  . Hyperlipidemia   . Diverticulosis     History   Social History  . Marital Status: Married    Spouse Name: N/A    Number of Children: N/A  . Years of Education: N/A   Occupational History  . Not on file.   Social History Main Topics  . Smoking status: Never Smoker   . Smokeless tobacco: Never Used  . Alcohol Use: No  . Drug Use: No  . Sexually Active: Yes   Other Topics Concern  . Not on file   Social History Narrative  . No narrative on file    Past Surgical History  Procedure Laterality Date  . Colonoscopy      Family History  Problem Relation Age of Onset  . Asthma Mother   . Brain cancer Father     Allergies  Allergen Reactions  . Eggs Or Egg-Derived Products     REACTION: no vaccines    Current Outpatient Prescriptions on File Prior to Visit  Medication Sig Dispense Refill  . aspirin 325 MG tablet Take 325 mg by mouth. Take 2 pills twice a day       . atorvastatin (LIPITOR) 10 MG tablet TAKE 1 TABLET BY MOUTH ONCE DAILY  90 tablet  1  . colchicine  0.6 MG tablet Take 0.6 mg by mouth 2 (two) times daily as needed.        No current facility-administered medications on file prior to visit.    BP 120/80  Pulse 68  Temp(Src) 98.2 F (36.8 C)  Resp 16  Ht 6\' 4"  (1.93 m)  Wt 270 lb (122.471 kg)  BMI 32.88 kg/m2        Objective:   Physical Exam  Nursing note and vitals reviewed. Constitutional: He is oriented to person, place, and time. He appears well-developed and well-nourished.  HENT:  Head: Normocephalic and atraumatic.  Eyes: Conjunctivae are normal. Pupils are equal, round, and reactive to light.  Neck: Normal range of motion. Neck supple.  Cardiovascular: Normal rate and regular rhythm.   Pulmonary/Chest: Effort normal and breath sounds normal.  Abdominal: Soft. Bowel sounds are normal.  Musculoskeletal: Normal range of motion.  Neurological: He is alert and oriented to person, place, and time.  Skin: Skin is warm and dry.  Psychiatric: His behavior is normal.          Assessment & Plan:   Patient presents for yearly preventative medicine examination.   all immunizations and health maintenance protocols were reviewed with the patient and they are up to date with these protocols.   screening laboratory values were reviewed with the patient including screening of hyperlipidemia PSA renal function and hepatic  function.   There medications past medical history social history problem list and allergies were reviewed in detail.   Goals were established with regard to weight loss exercise diet in compliance with medications\  Weight loss and diet

## 2013-03-07 NOTE — Patient Instructions (Signed)
The patient is instructed to continue all medications as prescribed. Schedule followup with check out clerk upon leaving the clinic  

## 2013-05-11 ENCOUNTER — Encounter (INDEPENDENT_AMBULATORY_CARE_PROVIDER_SITE_OTHER): Payer: 59 | Admitting: Ophthalmology

## 2013-05-11 ENCOUNTER — Encounter (INDEPENDENT_AMBULATORY_CARE_PROVIDER_SITE_OTHER): Payer: Self-pay | Admitting: Ophthalmology

## 2013-05-11 DIAGNOSIS — H43819 Vitreous degeneration, unspecified eye: Secondary | ICD-10-CM

## 2013-05-11 DIAGNOSIS — H3551 Vitreoretinal dystrophy: Secondary | ICD-10-CM

## 2013-05-11 DIAGNOSIS — H33309 Unspecified retinal break, unspecified eye: Secondary | ICD-10-CM

## 2013-05-11 DIAGNOSIS — D3130 Benign neoplasm of unspecified choroid: Secondary | ICD-10-CM

## 2013-05-11 DIAGNOSIS — H35439 Paving stone degeneration of retina, unspecified eye: Secondary | ICD-10-CM

## 2013-06-06 ENCOUNTER — Encounter (INDEPENDENT_AMBULATORY_CARE_PROVIDER_SITE_OTHER): Payer: 59 | Admitting: Ophthalmology

## 2013-06-06 DIAGNOSIS — H33309 Unspecified retinal break, unspecified eye: Secondary | ICD-10-CM

## 2013-06-15 ENCOUNTER — Other Ambulatory Visit: Payer: Self-pay | Admitting: Internal Medicine

## 2013-09-07 ENCOUNTER — Other Ambulatory Visit (INDEPENDENT_AMBULATORY_CARE_PROVIDER_SITE_OTHER): Payer: 59

## 2013-09-07 DIAGNOSIS — E785 Hyperlipidemia, unspecified: Secondary | ICD-10-CM

## 2013-09-07 LAB — HEPATIC FUNCTION PANEL
ALT: 23 U/L (ref 0–53)
Albumin: 4 g/dL (ref 3.5–5.2)
Total Protein: 6.6 g/dL (ref 6.0–8.3)

## 2013-09-07 LAB — LIPID PANEL
Cholesterol: 146 mg/dL (ref 0–200)
HDL: 41.6 mg/dL (ref >39.00)
LDL Cholesterol: 86 mg/dL (ref 0–99)
Triglycerides: 93 mg/dL (ref 0.0–149.0)
VLDL: 18.6 mg/dL (ref 0.0–40.0)

## 2013-09-19 ENCOUNTER — Encounter: Payer: Self-pay | Admitting: Internal Medicine

## 2013-09-19 ENCOUNTER — Ambulatory Visit (INDEPENDENT_AMBULATORY_CARE_PROVIDER_SITE_OTHER): Payer: 59 | Admitting: Internal Medicine

## 2013-09-19 VITALS — BP 140/72 | HR 72 | Temp 98.2°F | Resp 16 | Ht 76.0 in | Wt 276.0 lb

## 2013-09-19 DIAGNOSIS — E669 Obesity, unspecified: Secondary | ICD-10-CM | POA: Insufficient documentation

## 2013-09-19 DIAGNOSIS — E785 Hyperlipidemia, unspecified: Secondary | ICD-10-CM

## 2013-09-19 NOTE — Patient Instructions (Signed)
The patient is instructed to continue all medications as prescribed. Schedule followup with check out clerk upon leaving the clinic  

## 2013-09-19 NOTE — Progress Notes (Signed)
Pre visit review using our clinic review tool, if applicable. No additional management support is needed unless otherwise documented below in the visit note. 

## 2013-09-19 NOTE — Progress Notes (Signed)
   Subjective:    Patient ID: [name] Aguilar, male    DOB: July 02, 1956, 57 y.o.   MRN: [id number]  Hyperlipidemia Pertinent negatives include no shortness of breath.    Weight loss discussion and risk profile for patient with lipids, obesity and age Blood pressure risk and weight risk for DM  Lipid and liver reviewed Medication is 10 mg of lipitor daily 650 ASA BID  Review of Systems  Constitutional: Negative for fever and fatigue.  HENT: Negative for congestion, hearing loss and postnasal drip.   Eyes: Negative for discharge, redness and visual disturbance.  Respiratory: Negative for cough, shortness of breath and wheezing.   Cardiovascular: Negative for leg swelling.  Gastrointestinal: Negative for abdominal pain, constipation and abdominal distention.  Genitourinary: Negative for urgency and frequency.  Musculoskeletal: Negative for arthralgias, joint swelling and neck pain.  Skin: Negative for color change and rash.  Neurological: Negative for weakness and light-headedness.  Hematological: Negative for adenopathy.  Psychiatric/Behavioral: Negative for behavioral problems.       Past Medical History  Diagnosis Date  . Hyperlipidemia   . Diverticulosis     History   Social History  . Marital Status: Married    Spouse Name: N/A    Number of Children: N/A  . Years of Education: N/A   Occupational History  . Not on file.   Social History Main Topics  . Smoking status: Never Smoker   . Smokeless tobacco: Never Used  . Alcohol Use: No  . Drug Use: No  . Sexual Activity: Yes   Other Topics Concern  . Not on file   Social History Narrative  . No narrative on file    Past Surgical History  Procedure Laterality Date  . Colonoscopy      Family History  Problem Relation Age of Onset  . Asthma Mother   . Brain cancer Father     Allergies  Allergen Reactions  . Eggs Or Egg-Derived Products     REACTION: no vaccines    Current Outpatient  Prescriptions on File Prior to Visit  Medication Sig Dispense Refill  . aspirin 325 MG tablet Take 325 mg by mouth. Take 2 pills twice a day       . atorvastatin (LIPITOR) 10 MG tablet TAKE 1 TABLET BY MOUTH ONCE DAILY  90 tablet  1  . colchicine 0.6 MG tablet Take 0.6 mg by mouth 2 (two) times daily as needed.        No current facility-administered medications on file prior to visit.    BP 140/72  Pulse 72  Temp(Src) 98.2 F (36.8 C)  Resp 16  Ht 6\' 4"  (1.93 m)  Wt 276 lb (125.193 kg)  BMI 33.61 kg/m2    Objective:   Physical Exam  Vitals reviewed. Constitutional: He appears well-developed and well-nourished.  HENT:  Head: Normocephalic and atraumatic.  Eyes: Conjunctivae are normal. Pupils are equal, round, and reactive to light.  Cardiovascular: Normal rate and regular rhythm.   Pulmonary/Chest: Effort normal and breath sounds normal.  Abdominal: Bowel sounds are normal.  Skin: Skin is warm and dry.          Assessment & Plan:  Discussion of investing Lipids at goal CPX in summer weight management  I have spent more than 30 minutes examining this patient face-to-face of which over half was spent in counseling

## 2013-10-05 ENCOUNTER — Ambulatory Visit (INDEPENDENT_AMBULATORY_CARE_PROVIDER_SITE_OTHER): Payer: 59 | Admitting: Ophthalmology

## 2013-10-05 DIAGNOSIS — H33309 Unspecified retinal break, unspecified eye: Secondary | ICD-10-CM

## 2013-10-05 DIAGNOSIS — H43819 Vitreous degeneration, unspecified eye: Secondary | ICD-10-CM

## 2013-10-05 DIAGNOSIS — H40019 Open angle with borderline findings, low risk, unspecified eye: Secondary | ICD-10-CM

## 2013-10-05 DIAGNOSIS — H2510 Age-related nuclear cataract, unspecified eye: Secondary | ICD-10-CM

## 2013-12-12 ENCOUNTER — Other Ambulatory Visit: Payer: Self-pay | Admitting: Internal Medicine

## 2014-03-29 ENCOUNTER — Other Ambulatory Visit: Payer: 59

## 2014-04-03 ENCOUNTER — Encounter: Payer: 59 | Admitting: Internal Medicine

## 2014-08-14 ENCOUNTER — Telehealth: Payer: Self-pay | Admitting: Internal Medicine

## 2014-08-14 NOTE — Telephone Encounter (Signed)
Sure work him in as up to 3rd (preference) or 4th visit (if no 3rd visit available) 30 minute appointment per half day.

## 2014-08-14 NOTE — Telephone Encounter (Signed)
Pt was a jenkins pt and is requesting his cpe done dec/jan.  Wed are the best day for him.  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 Pt is on cholesterol med only. Refusing to see a male. He states he called several times early in the summer and was put off. Feels like we "dropped the ball". Any way you could work pt in, end or first of the year?

## 2014-08-14 NOTE — Telephone Encounter (Signed)
appt scheduled

## 2014-09-04 ENCOUNTER — Other Ambulatory Visit (INDEPENDENT_AMBULATORY_CARE_PROVIDER_SITE_OTHER): Payer: 59

## 2014-09-04 DIAGNOSIS — Z0000 Encounter for general adult medical examination without abnormal findings: Secondary | ICD-10-CM

## 2014-09-04 LAB — CBC WITH DIFFERENTIAL/PLATELET
BASOS ABS: 0 10*3/uL (ref 0.0–0.1)
Basophils Relative: 0.7 % (ref 0.0–3.0)
EOS ABS: 0.2 10*3/uL (ref 0.0–0.7)
Eosinophils Relative: 2.7 % (ref 0.0–5.0)
HCT: 49.5 % (ref 39.0–52.0)
HEMOGLOBIN: 16.2 g/dL (ref 13.0–17.0)
LYMPHS PCT: 19.7 % (ref 12.0–46.0)
Lymphs Abs: 1.3 10*3/uL (ref 0.7–4.0)
MCHC: 32.8 g/dL (ref 30.0–36.0)
MCV: 90.2 fl (ref 78.0–100.0)
Monocytes Absolute: 0.5 10*3/uL (ref 0.1–1.0)
Monocytes Relative: 7.7 % (ref 3.0–12.0)
NEUTROS ABS: 4.5 10*3/uL (ref 1.4–7.7)
Neutrophils Relative %: 69.2 % (ref 43.0–77.0)
PLATELETS: 167 10*3/uL (ref 150.0–400.0)
RBC: 5.49 Mil/uL (ref 4.22–5.81)
RDW: 14.5 % (ref 11.5–15.5)
WBC: 6.5 10*3/uL (ref 4.0–10.5)

## 2014-09-04 LAB — BASIC METABOLIC PANEL
BUN: 19 mg/dL (ref 6–23)
CALCIUM: 9.2 mg/dL (ref 8.4–10.5)
CO2: 29 meq/L (ref 19–32)
CREATININE: 1.1 mg/dL (ref 0.4–1.5)
Chloride: 107 mEq/L (ref 96–112)
GFR: 71.11 mL/min (ref >60.00)
GLUCOSE: 103 mg/dL — AB (ref 70–99)
Potassium: 5.3 mEq/L — ABNORMAL HIGH (ref 3.5–5.1)
SODIUM: 142 meq/L (ref 135–145)

## 2014-09-04 LAB — HEPATIC FUNCTION PANEL
ALT: 39 U/L (ref 0–53)
AST: 26 U/L (ref 0–37)
Albumin: 4.1 g/dL (ref 3.5–5.2)
Alkaline Phosphatase: 57 U/L (ref 39–117)
BILIRUBIN TOTAL: 1.5 mg/dL — AB (ref 0.2–1.2)
Bilirubin, Direct: 0.3 mg/dL (ref 0.0–0.3)
Total Protein: 6.1 g/dL (ref 6.0–8.3)

## 2014-09-04 LAB — LIPID PANEL
Cholesterol: 141 mg/dL (ref 0–200)
HDL: 46 mg/dL (ref >39.00)
LDL Cholesterol: 82 mg/dL (ref 0–99)
NONHDL: 95
Total CHOL/HDL Ratio: 3
Triglycerides: 64 mg/dL (ref 0.0–149.0)
VLDL: 12.8 mg/dL (ref 0.0–40.0)

## 2014-09-04 LAB — POCT URINALYSIS DIPSTICK
BILIRUBIN UA: NEGATIVE
GLUCOSE UA: NEGATIVE
Ketones, UA: NEGATIVE
LEUKOCYTES UA: NEGATIVE
NITRITE UA: NEGATIVE
RBC UA: NEGATIVE
Spec Grav, UA: 1.02
Urobilinogen, UA: 0.2
pH, UA: 6.5

## 2014-09-04 LAB — PSA: PSA: 0.14 ng/mL (ref 0.10–4.00)

## 2014-09-04 LAB — TSH: TSH: 1.63 u[IU]/mL (ref 0.35–4.50)

## 2014-09-11 ENCOUNTER — Ambulatory Visit (INDEPENDENT_AMBULATORY_CARE_PROVIDER_SITE_OTHER): Payer: 59 | Admitting: Family Medicine

## 2014-09-11 ENCOUNTER — Encounter: Payer: Self-pay | Admitting: Family Medicine

## 2014-09-11 VITALS — BP 140/72 | HR 68 | Temp 98.5°F | Wt 274.0 lb

## 2014-09-11 DIAGNOSIS — IMO0001 Reserved for inherently not codable concepts without codable children: Secondary | ICD-10-CM

## 2014-09-11 DIAGNOSIS — I4891 Unspecified atrial fibrillation: Secondary | ICD-10-CM | POA: Insufficient documentation

## 2014-09-11 DIAGNOSIS — I480 Paroxysmal atrial fibrillation: Secondary | ICD-10-CM

## 2014-09-11 DIAGNOSIS — L570 Actinic keratosis: Secondary | ICD-10-CM

## 2014-09-11 DIAGNOSIS — I499 Cardiac arrhythmia, unspecified: Secondary | ICD-10-CM

## 2014-09-11 DIAGNOSIS — Z0000 Encounter for general adult medical examination without abnormal findings: Secondary | ICD-10-CM

## 2014-09-11 DIAGNOSIS — Z8739 Personal history of other diseases of the musculoskeletal system and connective tissue: Secondary | ICD-10-CM

## 2014-09-11 LAB — BASIC METABOLIC PANEL
BUN: 22 mg/dL (ref 6–23)
CO2: 27 mEq/L (ref 19–32)
CREATININE: 1.1 mg/dL (ref 0.4–1.5)
Calcium: 9.1 mg/dL (ref 8.4–10.5)
Chloride: 104 mEq/L (ref 96–112)
GFR: 75.77 mL/min (ref >60.00)
Glucose, Bld: 84 mg/dL (ref 70–99)
Potassium: 4.8 mEq/L (ref 3.5–5.1)
Sodium: 139 mEq/L (ref 135–145)

## 2014-09-11 MED ORDER — METOPROLOL TARTRATE 25 MG PO TABS: 25.0000 mg | ORAL_TABLET | Freq: Two times a day (BID) | ORAL | Status: DC

## 2014-09-11 NOTE — Progress Notes (Signed)
S:  Irregular heartbeat was detected during physical exam. EKG confirmed this as below. Patient denies any history of atrial fibrillation.  patient denies any history of exertional chest pain or shortness of breath. He states sometimes he gets congested if he is working with leaves or in the yard but this has been going on since he was a child with no recent change. Denies new or recent medication changes.  Does admit when he laid down he had some back pain which may have raised HR.   ROS-no dizziness, lightheadedness, chest pain, shortness of breath, palpitations.   O: Initial HR 68 on exam EKG HR in 130s I rechecked after EKG and was in low 100s or high 90s Recheck before end of visit was at 70  Patient stands without dizziness or orthostatic symptoms. Steady gait.   EKG: atrial fibrillation with rate 133, right axis deviation, , normal qrsand qt interval, no hypertrophy  Results for orders placed or performed in visit on 09/11/14 (from the past 24 hour(s))  Basic metabolic panel     Status: None   Collection Time: 09/11/14 12:25 PM  Result Value Ref Range   Sodium 139 135 - 145 mEq/L   Potassium 4.8 3.5 - 5.1 mEq/L   Chloride 104 96 - 112 mEq/L   CO2 27 19 - 32 mEq/L   Glucose, Bld 84 70 - 99 mg/dL   BUN 22 6 - 23 mg/dL   Creatinine, Ser 1.1 0.4 - 1.5 mg/dL   Calcium 9.1 8.4 - 10.5 mg/dL   GFR 75.77 >60.00 mL/min   A/P:   Atrial fibrillation New-onset atrial fibrillation. Patient asymptomatic at this time. Given patient is asymptomatic, it is unclear how long he may have been in atrial fibrillation. His heart rate was in the 60s when he arrives and it is possible he was in sinus at that time though unclear. Patient already taking aspirin and chadsvasc score is 1 so this is reasonable. HR returned to 70 after spiking to 130s with EKG. Ultimately decided to put on low dose beta blocker to prevent tachycardia but gave warning signs. Obtain echocardiogram. Recent TSH normal. Repeat  BMET as recent hyperkalemia (thought to be hemolysis though) and elctrolytes came back normal. No history of anginal symptoms or signs of ischemia so no troponin at this time. No signs HF so no BNP.    Follow up next week, if remains in a fib we discussed that I would likely strongly encourage cardiology consult at that time to discuss cardioversion options (patient declined referral at this time though for today).    Orders Placed This Encounter  Procedures  . Basic metabolic panel    [place]  . EKG 12-Lead  . 2D Echocardiogram without contrast    Standing Status: Future     Number of Occurrences:      Standing Expiration Date: 09/12/2015    Scheduling Instructions:     Patient available dates Wednesday dec 2, Thursday dec 10th, wed dec 16, wed dec 23.    Order Specific Question:  Type of Echo    Answer:  Complete    Order Specific Question:  Where should this test be performed    Answer:  [facility])    Order Specific Question:  Reason for exam-Echo    Answer:  Atrial Fibrillation  427.31 / I48.91   Meds ordered this encounter  Medications  . metoprolol tartrate (LOPRESSOR) 25 MG tablet    Sig: Take 1 tablet (25  mg total) by mouth 2 (two) times daily.    Dispense:  60 tablet    Refill:  1

## 2014-09-11 NOTE — Progress Notes (Signed)
[name] Reddish, MD Phone: [phone]  Subjective:  Patient presents today to establish care with me as their new primary care provider and for annual physical. Patient was formerly a patient of Dr. Arnoldo Morale. Chief complaint-noted.   The patient is practicing as a Psychologist, clinical in town. He states he has done well over the last year except for a being little more sedentary than he used to be. He knows he needs to work on some weight loss. His wife is a Microbiologist and he knows she can help him. We discussed his blood pressure running up slightly. Patient would prefer to work on diet and exercise rather than start on medication.  ROS- Full ROS completed and negative (some specific comments in separate note).   The following were reviewed and entered/updated in epic: Past Medical History  Diagnosis Date  . Hyperlipidemia   . Diverticulosis   . Vitreous anomalies 2014    Tack down of vitreous tear   Patient Active Problem List   Diagnosis Date Noted  . Hyperlipidemia 06/23/2007    Priority: Medium  . Obesity (BMI 30-39.9) 09/19/2013    Priority: Low  . H/O calcium pyrophosphate deposition disease (CPPD) 10/16/2009    Priority: Low   Past Surgical History  Procedure Laterality Date  . Colonoscopy    . Eye surgery  2014    Family History  Problem Relation Age of Onset  . Asthma Mother   . Brain cancer Father     not genetic  . Alzheimer's disease Mother     diagnosed 59    Medications- reviewed and updated Current Outpatient Prescriptions  Medication Sig Dispense Refill  . aspirin 325 MG tablet Take 325 mg by mouth. Take 2 pills twice a day     . atorvastatin (LIPITOR) 10 MG tablet TAKE 1 TABLET BY MOUTH ONCE DAILY 90 tablet 3   No current facility-administered medications for this visit.    Allergies-reviewed and updated Allergies  Allergen Reactions  . Eggs Or Egg-Derived Products     REACTION: no vaccines    History   Social History  . Marital Status: Married   Spouse Name: N/A    Number of Children: N/A  . Years of Education: N/A   Social History Main Topics  . Smoking status: Never Smoker   . Smokeless tobacco: Never Used  . Alcohol Use: 0.0 oz/week    0 Not specified per week     Comment: 1 drink once a month max  . Drug Use: No  . Sexual Activity: Yes   Other Topics Concern  . None   Social History Narrative   Married 1977. 2 children (son in doctorate program at [employer] and daughter with marketing group in [place]). No grandkids   [employer] [place]   [employer] for [employer] school.       Veterinarian in town. [facility] hospital (been there for 5 years, [place] 27 years before that). 3 dogs, 1 cat, 1 snake.      Hobbies: fishing on reservoirs in [place], family time, nature, hiking    ROS--See HPI   Objective: BP 140/72 mmHg  Pulse 68  Temp(Src) 98.5 F (36.9 C)  Wt 274 lb (124.286 kg) Gen: NAD, resting comfortably inchair HEENT: Mucous membranes are moist. Oropharynx normal. TM normal.  Neck: no thyromegaly CV: irregularly irregular. Rate varies from 70 to 130 with 70s noted before patient left.  Lungs: CTAB no crackles, wheeze, rhonchi Abdomen: soft/nontender/nondistended/normal bowel sounds. No rebound or guarding.  Ext: no edema, 2+ DP and PT pulses Skin: warm, dry, no rash, erythematous base with scale on right, left cheek, forehead in bald spot (4 separate lesions treated with cryotherapy) Neuro: grossly normal, moves all extremities, PERRLA   Assessment/Plan:  58 y.o. male presenting for annual physical.  Health Maintenance counseling: 1. Anticipatory guidance: Patient counseled regarding regular dental exams, wearing seatbelts, regular eye exams, wearing sunscreen.  2. Risk factor reduction:  Advised patient of need for regular exercise (150 minutes per week exercise and walking counts) and diet rich and fruits and vegetables to reduce risk of heart attack and stroke.  3. Immunizations/screenings/ancillary studies - up  to date, no flu shot due to egg allergy 4. Up to date on labs. [place] with watching in 1 year.   5. Goal weight closer to 260-265 at least within a year for at risk diabetes concern and blood pressure trend.   6. Your wife sounds like a great resource in helping you get towards your goal!   7. Froze actinic keratosis (precancers)  8. See separate note for incidental atrial fibrillation finding

## 2014-09-11 NOTE — Assessment & Plan Note (Signed)
New-onset atrial fibrillation. Patient asymptomatic at this time. Given patient is asymptomatic, it is unclear how long he may have been in atrial fibrillation. His heart rate was in the 60s when he arrives and it is possible he was in sinus at that time though unclear. Patient already taking aspirin and chadsvasc score is 1 so this is reasonable. HR returned to 70 after spiking to 130s with EKG. Ultimately decided to put on low dose beta blocker to prevent tachycardia but gave warning signs. Obtain echocardiogram. Recent TSH normal. Repeat BMET as recent hyperkalemia (thought to be hemolysis though) and elctrolytes came back normal. No history of anginal symptoms or signs of ischemia so no troponin at this time. No signs HF so no BNP.    Follow up next week, if remains in a fib we discussed that I would likely strongly encourage cardiology consult at that time to discuss cardioversion options (patient declined referral at this time though for today).

## 2014-09-11 NOTE — Patient Instructions (Addendum)
58 y.o. male presenting for annual physical.  Health Maintenance counseling: 1. Anticipatory guidance: Patient counseled regarding regular dental exams, wearing seatbelts, regular eye exams, wearing sunscreen.  2. Risk factor reduction:  Advised patient of need for regular exercise (150 minutes per week exercise and walking counts) and diet rich and fruits and vegetables to reduce risk of heart attack and stroke.  3. Immunizations/screenings/ancillary studies - up to date, no flu shot due to egg allergy 4. Up to date on labs. [place] with watching in 1 year.   5. Goal weight closer to 260-265 at least within a year for at risk diabetes concern and blood pressure trend.   6. Your wife sounds like a great resource in helping you get towards your goal!   7. Froze actinic keratosis (precancers)  8. Irregular heart beat on exam-  Ended up being atrial fibrillation Get you set up for echo. Repeat electrolytes.  See me back sometime next week for recheck Add metoprolol 25 mg twice a day to slow your rate.  If you had chest pain, shortness of breath, dizziness, heart racing feeling-seek care.

## 2014-09-18 ENCOUNTER — Ambulatory Visit (INDEPENDENT_AMBULATORY_CARE_PROVIDER_SITE_OTHER): Payer: 59 | Admitting: Family Medicine

## 2014-09-18 ENCOUNTER — Ambulatory Visit (HOSPITAL_COMMUNITY): Payer: 59 | Attending: Family Medicine

## 2014-09-18 ENCOUNTER — Other Ambulatory Visit (HOSPITAL_COMMUNITY): Payer: 59 | Admitting: Cardiology

## 2014-09-18 ENCOUNTER — Encounter: Payer: Self-pay | Admitting: Family Medicine

## 2014-09-18 VITALS — BP 118/90 | Temp 98.2°F | Wt 277.0 lb

## 2014-09-18 DIAGNOSIS — E785 Hyperlipidemia, unspecified: Secondary | ICD-10-CM | POA: Insufficient documentation

## 2014-09-18 DIAGNOSIS — I480 Paroxysmal atrial fibrillation: Secondary | ICD-10-CM | POA: Insufficient documentation

## 2014-09-18 DIAGNOSIS — I4891 Unspecified atrial fibrillation: Secondary | ICD-10-CM

## 2014-09-18 DIAGNOSIS — E669 Obesity, unspecified: Secondary | ICD-10-CM | POA: Insufficient documentation

## 2014-09-18 MED ORDER — PERFLUTREN LIPID MICROSPHERE
1.0000 mL | Freq: Once | INTRAVENOUS | Status: AC
  Administered 2014-09-18: 1 mL via INTRAVENOUS

## 2014-09-18 NOTE — Assessment & Plan Note (Signed)
Persistent atrial fibrillation of unknown duration. Remains asymptomatic. On appropriate therapy with aspirin with chadsvasc of 1. Started metoprolol 12.5mg  BID last week and increase to 25mg  BID as rate still elevated at 107 on EKG. Echocardiogram this afternoon.   Since he remains in a fib, will have him see cardiology to discuss if they would like to attempt to get him back in rhythm pharmacologically or through cardioversion.

## 2014-09-18 NOTE — Progress Notes (Signed)
2D Echo completed. 09/18/2014 

## 2014-09-18 NOTE — Progress Notes (Signed)
  Garret Reddish, MD Phone: [phone]  Subjective:   [name] Aguilar is a 58 y.o. year old very pleasant male patient who presents with the following:  Atrial Fibrillation (new onset/detection last visit) persistent at this time Patient states since taking beta blocker he has been resting better at night. He feels no ill effects. Seems to be urinating slightly less. He continues to not feel palpitations with his atrial fibrillation. EKG shows today that the rhythm persists. We had discussed cardiology referral if it persisted and he is agreeable.  ROS- no lightheadedness/dizziness/chest pain/shortness of breath.   Past Medical History- Patient Active Problem List   Diagnosis Date Noted  . Atrial fibrillation 09/11/2014    Priority: Medium  . Hyperlipidemia 06/23/2007    Priority: Medium  . Obesity (BMI 30-39.9) 09/19/2013    Priority: Low  . H/O calcium pyrophosphate deposition disease (CPPD) 10/16/2009    Priority: Low   Medications- reviewed and updated Current Outpatient Prescriptions  Medication Sig Dispense Refill  . aspirin 325 MG tablet Take 325 mg by mouth. Take 2 pills twice a day     . atorvastatin (LIPITOR) 10 MG tablet TAKE 1 TABLET BY MOUTH ONCE DAILY 90 tablet 3  . metoprolol tartrate (LOPRESSOR) 25 MG tablet Take 1 tablet (25 mg total) by mouth 2 (two) times daily. 60 tablet 1   No current facility-administered medications for this visit.    Objective: BP 118/90 mmHg  Temp(Src) 98.2 F (36.8 C)  Wt 277 lb (125.646 kg) Gen: NAD, resting comfortably, stands without lightheadedness CV: slightly rapid rate, irregular rhythm, no murmurs rubs or gallops Lungs: CTAB no crackles, wheeze, rhonchi Ext: no edema, 2+ radial and PT pulses Skin: warm, dry, no rash  EKG: remains in atrial fibrillation, rate has slowed to 107 from 130s last week.   Assessment/Plan:  Atrial fibrillation Persistent atrial fibrillation of unknown duration. Remains asymptomatic. On  appropriate therapy with aspirin with chadsvasc of 1. Started metoprolol 12.5mg  BID last week and increase to 25mg  BID as rate still elevated at 107 on EKG. Echocardiogram this afternoon.   Since he remains in a fib, will have him see cardiology to discuss if they would like to attempt to get him back in rhythm pharmacologically or through cardioversion.    Strict Return precautions advised (including reasons to go to ED/ER).   Will call patient with echocardiogram results.   Orders Placed This Encounter  Procedures  . Ambulatory referral to Cardiology    Referral Priority:  Routine    Referral Type:  Consultation    Referral Reason:  Specialty Services Required    Requested Specialty:  Cardiology    Number of Visits Requested:  1  . EKG 12-Lead

## 2014-09-18 NOTE — Patient Instructions (Signed)
Increase metoprolol to 25mg  (full tab) twice a day.   Continue aspirin  Send to cardiology for evaluation and their thoughts on if they want to try to get you back in a normal rhythm.   I will call you with echo results when available.

## 2014-10-25 ENCOUNTER — Encounter: Payer: Self-pay | Admitting: *Deleted

## 2014-10-25 ENCOUNTER — Encounter: Payer: Self-pay | Admitting: Cardiology

## 2014-10-25 ENCOUNTER — Ambulatory Visit (INDEPENDENT_AMBULATORY_CARE_PROVIDER_SITE_OTHER): Payer: 59 | Admitting: Cardiology

## 2014-10-25 VITALS — BP 122/86 | HR 51 | Ht 76.0 in | Wt 278.0 lb

## 2014-10-25 DIAGNOSIS — E669 Obesity, unspecified: Secondary | ICD-10-CM

## 2014-10-25 DIAGNOSIS — E785 Hyperlipidemia, unspecified: Secondary | ICD-10-CM

## 2014-10-25 DIAGNOSIS — Z01810 Encounter for preprocedural cardiovascular examination: Secondary | ICD-10-CM

## 2014-10-25 DIAGNOSIS — I480 Paroxysmal atrial fibrillation: Secondary | ICD-10-CM

## 2014-10-25 MED ORDER — METOPROLOL TARTRATE 25 MG PO TABS: 25.0000 mg | ORAL_TABLET | Freq: Two times a day (BID) | ORAL | Status: DC

## 2014-10-25 MED ORDER — APIXABAN 5 MG PO TABS: 5.0000 mg | ORAL_TABLET | Freq: Two times a day (BID) | ORAL | Status: DC

## 2014-10-25 MED ORDER — LISINOPRIL 5 MG PO TABS: 5.0000 mg | ORAL_TABLET | Freq: Every day | ORAL | Status: DC

## 2014-10-25 NOTE — Addendum Note (Signed)
Addended by: Candee Furbish C on: 10/25/2014 10:26 AM   Modules accepted: Orders

## 2014-10-25 NOTE — Patient Instructions (Signed)
Please stop your ASA. Start Eliquis 5 mg one tablet twice a day. Start Lisinopril 5 mg a day. Continue all other medications as listed.  Please return on or after 1/27 for pre-cardioversion blood work. (CBC,BMP)  Your physician has requested that you have a Cardioversion. Electrical Cardioversion uses a jolt of electricity to your heart either through paddles or wired patches attached to your chest. This is a controlled, usually prescheduled, procedure. This procedure is done at the hospital and you are not awake during the procedure. You usually go home the day of the procedure. Please see the instruction sheet given to you today for more information.  Follow up with Dr Marlou Porch one week after your cardioversion.

## 2014-10-25 NOTE — Progress Notes (Signed)
[facility] 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 Yong Channel his primary physician. No chest pain, no shortness of breath, no lightheadedness, no ill effects of atrial fibrillation. According to prior office visit, he may feel like he is urinating slightly less but otherwise does not feel his atrial fibrillation.  Had 2 episodes in past year, in altitude, hiking, feeling some dyspnea, in [place]. But has a history of exertional asthma.   He is currently metoprolol twice a day. 25 mg. Takes atorvastatin for hyperlipidemia. He is also on a full dose aspirin.     Wt Readings from Last 3 Encounters:  10/25/14 278 lb (126.1 kg)  09/18/14 277 lb (125.646 kg)  09/11/14 274 lb (124.286 kg)     Past Medical History  Diagnosis Date  . Hyperlipidemia   . Diverticulosis   . Vitreous anomalies 2014    Tack down of vitreous tear    Past Surgical History  Procedure Laterality Date  . Colonoscopy    . Eye surgery  2014    Current Outpatient Prescriptions  Medication Sig Dispense Refill  . aspirin 325 MG tablet Take 325 mg by mouth. Take 2 pills twice a day     . atorvastatin (LIPITOR) 10 MG tablet TAKE 1 TABLET BY MOUTH ONCE DAILY 90 tablet 3  . metoprolol tartrate (LOPRESSOR) 25 MG tablet Take 1 tablet (25 mg total) by mouth 2 (two) times daily. 60 tablet 1   No current facility-administered medications for this visit.    Allergies:    Allergies  Allergen Reactions  . Eggs Or Egg-Derived Products     REACTION: no vaccines    Social History:  The patient  reports that he has never smoked. He has never used smokeless tobacco. He reports that he drinks alcohol. He reports  that he does not use illicit drugs.   Family History  Problem Relation Age of Onset  . Asthma Mother   . Brain cancer Father     not genetic  . Alzheimer's disease Mother     diagnosed 81  . Other Brother     gilbert disease    ROS:  Please see the history of present illness.   Denies any strokelike symptoms, fevers, chills, orthopnea, PND, syncope, bleeding, rash, thyroid illness, diarrhea. 10/14 - retinal tear.   All other systems reviewed and negative.   PHYSICAL EXAM: VS:  BP 122/86 mmHg  Pulse 51  Ht 6\' 4"  (1.93 m)  Wt 278 lb (126.1 kg)  BMI 33.85 kg/m2 Well nourished, well developed, in no acute distress HEENT: normal, [place]/AT, EOMI Neck: no JVD, normal carotid upstroke, no bruit Cardiac:  normal S1, S2; RRR; no murmur Lungs:  clear to auscultation bilaterally, no wheezing, rhonchi or rales Abd: soft, nontender, no hepatomegaly, no bruits Ext: no edema, 2+ distal pulses Skin: warm and dry GU: deferred Neuro: no focal abnormalities noted, AAO x 3  EKG:  09/18/14-atrial fibrillation, heart rate 103, left anterior fascicular block, nonspecific T-wave changes.  Echocardiogram: 09/18/14-EF 35%, diffuse hypokinesis. Left atrium 47 mm, moderately dilated.  Labs: Creatinine 1.1, potassium 4.8, LDL 82, hemoglobin 16.2  ASSESSMENT AND PLAN:  1.  Atrial fibrillation, currently persistent-left atrium mildly dilated, EF 35%. Currently he appears to be fairly well rate controlled with metoprolol. We will continue. Hopefully his ejection fraction is reduced secondary to tachycardia induced cardiomyopathy. When he was discovered to be in atrial fibrillation his heart rate was in the 130 range. He does note that his minor lower extremity edema has improved since being on the metoprolol, hopefully this is a sign that his cardiomyopathy is improving. Because this is the first instance of discovered atrial fibrillation, I would like to try to restore sinus rhythm for him. Currently his  CHADS-VASC score is 2 given ejection fraction of 35% as well as hypertension. At age 22 he would get a point for age. Because of this, and upcoming cardioversion, I will start him on Eliquis 5 mg twice a day. Hemoglobin and creatinine were normal. After 3 weeks of being on Eliquis consistently, we will perform elective cardioversion. Risks and benefits of this procedure have been discussed including arrhythmia, stroke. We will see him back 1 week after cardioversion. 2. Cardiopathy-hopefully this is a tachycardia induced cardiomyopathy. We will be rechecking echocardiogram in approximately 90 days. I have started lisinopril 5 g once a day. He is currently on beta blocker. If cardiomyopathy persists despite rate control/restoration of sinus rhythm further workup will be necessary. Also we would likely transition his metoprolol tartrate over 2 Toprol-XL. 3. Obesity-encourage weight loss. This can go hand-in-hand with atrial fibrillation. 4. Snoring-think about sleep study. We discussed. This can go hand-in-hand with atrial fibrillation as well. 5. We will be seeing back soon in clinic.  Signed, Candee Furbish, MD [facility]  10/25/2014 9:04 AM

## 2014-11-13 ENCOUNTER — Other Ambulatory Visit (INDEPENDENT_AMBULATORY_CARE_PROVIDER_SITE_OTHER): Payer: 59 | Admitting: *Deleted

## 2014-11-13 DIAGNOSIS — Z01810 Encounter for preprocedural cardiovascular examination: Secondary | ICD-10-CM

## 2014-11-13 DIAGNOSIS — I480 Paroxysmal atrial fibrillation: Secondary | ICD-10-CM

## 2014-11-13 LAB — CBC
HCT: 46.6 % (ref 39.0–52.0)
Hemoglobin: 16.1 g/dL (ref 13.0–17.0)
MCHC: 34.4 g/dL (ref 30.0–36.0)
MCV: 86.7 fl (ref 78.0–100.0)
Platelets: 177 10*3/uL (ref 150.0–400.0)
RBC: 5.38 Mil/uL (ref 4.22–5.81)
RDW: 14.2 % (ref 11.5–15.5)
WBC: 6.4 10*3/uL (ref 4.0–10.5)

## 2014-11-13 LAB — BASIC METABOLIC PANEL
BUN: 18 mg/dL (ref 6–23)
CO2: 31 meq/L (ref 19–32)
CREATININE: 1.06 mg/dL (ref 0.40–1.50)
Calcium: 9.3 mg/dL (ref 8.4–10.5)
Chloride: 107 mEq/L (ref 96–112)
GFR: 75.73 mL/min (ref >60.00)
Glucose, Bld: 94 mg/dL (ref 70–99)
Potassium: 4.5 mEq/L (ref 3.5–5.1)
SODIUM: 141 meq/L (ref 135–145)

## 2014-11-13 NOTE — Addendum Note (Signed)
Addended by: Eulis Foster on: 11/13/2014 09:59 AM   Modules accepted: Orders

## 2014-11-14 ENCOUNTER — Telehealth: Payer: Self-pay | Admitting: *Deleted

## 2014-11-14 NOTE — Telephone Encounter (Signed)
Called pt to let him know he has been scheduled for his outpt cardioversion at [facility] 2/3 at 12:30.  He needs to report at 11 AM.  Lab work was normal.

## 2014-11-15 NOTE — Telephone Encounter (Signed)
Pt contacted about scheduled cardioversion for 11-20-14 at 12:30 pm with Dr Debara Pickett at [facility].  Instructed the pt that he must arrive by 11 am at [facility].  Informed the pt that per Dr Marlou Porch his labs were normal as well.  Informed the pt that he can pick his instruction letter up or I can fax this to him.  Pt requested that we fax his cardioversion instruction letter to his place of work, [employer] at [phone].  Faxed this letter immediately with confirmation.  Pt also requesting to confirm that his allergies are updated in his chart for eggs and any egg derivative product, for he states that the propofol they use for cardioversions will send him into anaphylactic shock, for he states this has an egg derivative in it. Informed the pt that in bold, highlighted yellow, the pts allergies were updated showing-ALLERGY TO EGGS OR EGG-DERIVED PRODUCTS. Pt verbalized understanding, agrees with the plan, and gracious for all the assistance provided.

## 2014-11-15 NOTE — Telephone Encounter (Signed)
New problem   Pt returning a call from nurse concerning results and appt for cardioversion.

## 2014-11-20 ENCOUNTER — Encounter (HOSPITAL_COMMUNITY)
Admission: RE | Disposition: A | Discharge status: Home / Self Care | Payer: Self-pay | Source: Ambulatory Visit | Attending: Internal Medicine

## 2014-11-20 ENCOUNTER — Ambulatory Visit (HOSPITAL_COMMUNITY): Payer: 59 | Admitting: Certified Registered Nurse Anesthetist

## 2014-11-20 ENCOUNTER — Encounter (HOSPITAL_COMMUNITY): Payer: Self-pay | Admitting: Anesthesiology

## 2014-11-20 ENCOUNTER — Ambulatory Visit (HOSPITAL_COMMUNITY)
Admission: RE | Admit: 2014-11-20 | Discharge: 2014-11-20 | Disposition: A | Discharge status: Home / Self Care | Payer: 59 | Source: Ambulatory Visit | Attending: Internal Medicine | Admitting: Internal Medicine

## 2014-11-20 ENCOUNTER — Ambulatory Visit: Payer: 59 | Admitting: Cardiology

## 2014-11-20 DIAGNOSIS — I10 Essential (primary) hypertension: Secondary | ICD-10-CM | POA: Diagnosis not present

## 2014-11-20 DIAGNOSIS — I4891 Unspecified atrial fibrillation: Secondary | ICD-10-CM | POA: Diagnosis present

## 2014-11-20 DIAGNOSIS — I4819 Other persistent atrial fibrillation: Secondary | ICD-10-CM | POA: Diagnosis present

## 2014-11-20 DIAGNOSIS — I481 Persistent atrial fibrillation: Secondary | ICD-10-CM

## 2014-11-20 HISTORY — DX: Essential (primary) hypertension: I10

## 2014-11-20 HISTORY — PX: CARDIOVERSION: SHX1299

## 2014-11-20 SURGERY — CARDIOVERSION
Anesthesia: General

## 2014-11-20 MED ORDER — SODIUM CHLORIDE 0.9 % IV SOLN
INTRAVENOUS | Status: DC
  Administered 2014-11-20: 500 mL via INTRAVENOUS

## 2014-11-20 MED ORDER — LIDOCAINE HCL (CARDIAC) 20 MG/ML IV SOLN
INTRAVENOUS | Status: DC | PRN
  Administered 2014-11-20: 50 mg via INTRAVENOUS

## 2014-11-20 MED ORDER — LIDOCAINE HCL 1 % IJ SOLN: INTRAMUSCULAR | Status: DC | PRN

## 2014-11-20 MED ORDER — ETOMIDATE 2 MG/ML IV SOLN
INTRAVENOUS | Status: DC | PRN
  Administered 2014-11-20: 12 mg via INTRAVENOUS
  Administered 2014-11-20: 4 mg via INTRAVENOUS

## 2014-11-20 NOTE — Anesthesia Postprocedure Evaluation (Signed)
  Anesthesia Post-op Note  Patient: [name] Aguilar  Procedure(s) Performed: Procedure(s) with comments: CARDIOVERSION (N/A) - 11:38 Etomidate given IV for unsuccessful synched cardioversion @ 150 joules from Afib, 11:39 repeated synched cardioversion @ 200 joules successfully from Afib to SR. 12 lead EKG ordered to confirm..  Patient Location: PACU, ICU and Endoscopy Unit  Anesthesia Type:General  Level of Consciousness: awake, alert , oriented and patient cooperative  Airway and Oxygen Therapy: Patient Spontanous Breathing and Patient connected to nasal cannula oxygen  Post-op Pain: none  Post-op Assessment: Post-op Vital signs reviewed, Patient's Cardiovascular Status Stable, Respiratory Function Stable, Patent Airway and No signs of Nausea or vomiting  Post-op Vital Signs: Reviewed  Last Vitals:  Filed Vitals:   11/20/14 1152  BP: 119/59  Pulse: 63  Temp:   Resp: 18    Complications: No apparent anesthesia complications

## 2014-11-20 NOTE — Discharge Instructions (Signed)
Electrical Cardioversion Electrical cardioversion is the delivery of a jolt of electricity to change the rhythm of the heart. Sticky patches or metal paddles are placed on the chest to deliver the electricity from a device. This is done to restore a normal rhythm. A rhythm that is too fast or not regular keeps the heart from pumping well. Electrical cardioversion is done in an emergency if:   There is low or no blood pressure as a result of the heart rhythm.   Normal rhythm must be restored as fast as possible to protect the brain and heart from further damage.   It may save a life. Cardioversion may be done for heart rhythms that are not immediately life threatening, such as atrial fibrillation or flutter, in which:   The heart is beating too fast or is not regular.   Medicine to change the rhythm has not worked.   It is safe to wait in order to allow time for preparation.  Symptoms of the abnormal rhythm are bothersome.  The risk of stroke and other serious problems can be reduced. LET [facility] CARE PROVIDER KNOW ABOUT:   Any allergies you have.  All medicines you are taking, including vitamins, herbs, eye drops, creams, and over-the-counter medicines.  Previous problems you or members of your family have had with the use of anesthetics.   Any blood disorders you have.   Previous surgeries you have had.   Medical conditions you have. RISKS AND COMPLICATIONS  Generally, this is a safe procedure. However, problems can occur and include:   Breathing problems related to the anesthetic used.  A blood clot that breaks free and travels to other parts of your body. This could cause a stroke or other problems. The risk of this is lowered by use of blood-thinning medicine (anticoagulant) prior to the procedure.  Cardiac arrest (rare). BEFORE THE PROCEDURE   You may have tests to detect blood clots in your heart and to evaluate heart function.  You may start taking  anticoagulants so your blood does not clot as easily.   Medicines may be given to help stabilize your heart rate and rhythm. PROCEDURE  You will be given medicine through an IV tube to reduce discomfort and make you sleepy (sedative).   An electrical shock will be delivered. AFTER THE PROCEDURE Your heart rhythm will be watched to make sure it does not change.  Document Released: 09/24/2002 Document Revised: 02/18/2014 Document Reviewed: 04/18/2013 [facility] Patient Information 2015 [place], [place]. This information is not intended to replace advice given to you by your health care provider. Make sure you discuss any questions you have with your health care provider. Conscious Sedation, Adult, Care After Refer to this sheet in the next few weeks. These instructions provide you with information on caring for yourself after your procedure. Your health care provider may also give you more specific instructions. Your treatment has been planned according to current medical practices, but problems sometimes occur. Call your health care provider if you have any problems or questions after your procedure. WHAT TO EXPECT AFTER THE PROCEDURE  After your procedure:  You may feel sleepy, clumsy, and have poor balance for several hours.  Vomiting may occur if you eat too soon after the procedure. HOME CARE INSTRUCTIONS  Do not participate in any activities where you could become injured for at least 24 hours. Do not:  Drive.  Swim.  Ride a bicycle.  Operate heavy machinery.  Cook.  Use power tools.  Climb ladders.  Work from a high place.  Do not make important decisions or sign legal documents until you are improved.  If you vomit, drink water, juice, or soup when you can drink without vomiting. Make sure you have little or no nausea before eating solid foods.  Only take over-the-counter or prescription medicines for pain, discomfort, or fever as directed by your health care  provider.  Make sure you and your family fully understand everything about the medicines given to you, including what side effects may occur.  You should not drink alcohol, take sleeping pills, or take medicines that cause drowsiness for at least 24 hours.  If you smoke, do not smoke without supervision.  If you are feeling better, you may resume normal activities 24 hours after you were sedated.  Keep all appointments with your health care provider. SEEK MEDICAL CARE IF:  Your skin is pale or bluish in color.  You continue to feel nauseous or vomit.  Your pain is getting worse and is not helped by medicine.  You have bleeding or swelling.  You are still sleepy or feeling clumsy after 24 hours. SEEK IMMEDIATE MEDICAL CARE IF:  You develop a rash.  You have difficulty breathing.  You develop any type of allergic problem.  You have a fever. MAKE SURE YOU:  Understand these instructions.  Will watch your condition.  Will get help right away if you are not doing well or get worse. Document Released: 07/25/2013 Document Reviewed: 07/25/2013 [facility] Patient Information 2015 [place], [place]. This information is not intended to replace advice given to you by your health care provider. Make sure you discuss any questions you have with your health care provider.

## 2014-11-20 NOTE — Transfer of Care (Signed)
Immediate Anesthesia Transfer of Care Note  Patient: [name] Aguilar  Procedure(s) Performed: Procedure(s) with comments: CARDIOVERSION (N/A) - 11:38 Etomidate given IV for unsuccessful synched cardioversion @ 150 joules from Afib, 11:39 repeated synched cardioversion @ 200 joules successfully from Afib to SR. 12 lead EKG ordered to confirm..  Patient Location: Endoscopy Unit  Anesthesia Type:General  Level of Consciousness: awake, oriented and patient cooperative  Airway & Oxygen Therapy: Patient Spontanous Breathing and Patient connected to nasal cannula oxygen  Post-op Assessment: Report given to RN and Post -op Vital signs reviewed and stable  Post vital signs: Reviewed  Last Vitals:  Filed Vitals:   11/20/14 1152  BP: 119/59  Pulse: 63  Temp:   Resp: 18    Complications: No apparent anesthesia complications

## 2014-11-20 NOTE — H&P (Signed)
     INTERVAL PROCEDURE H&P  History and Physical Interval Note:  11/20/2014 11:16 AM  [name] Aguilar has presented today for their planned procedure. The various methods of treatment have been discussed with the patient and family. After consideration of risks, benefits and other options for treatment, the patient has consented to the procedure.  The patients' outpatient history has been reviewed, patient examined, and no change in status from most recent office note within the past 30 days. I have reviewed the patients' chart and labs and will proceed as planned. Questions were answered to the patient's satisfaction.   Pixie Casino, MD, [facility] Attending Cardiologist [facility]  Elmon Shader C 11/20/2014, 11:16 AM

## 2014-11-20 NOTE — Anesthesia Preprocedure Evaluation (Addendum)
Anesthesia Evaluation  Patient identified by MRN, date of birth, ID band Patient awake    Reviewed: Allergy & Precautions, NPO status , reviewed documented beta blocker date and time   History of Anesthesia Complications Negative for: history of anesthetic complications  Airway Mallampati: III  TM Distance: >3 FB Neck ROM: Full    Dental  (+) Teeth Intact, Dental Advisory Given   Pulmonary neg pulmonary ROS,    Pulmonary exam normal       Cardiovascular hypertension, Pt. on medications and Pt. on home beta blockers + dysrhythmias Atrial Fibrillation Rhythm:Irregular Rate:Tachycardia  Echo 09/18/2014 Study Conclusions  - Left ventricle: Technically limited study. The LV is moderately dilated. Can not be sure about the exact measurements. Wall thickness was increased in a pattern of mild LVH. The estimated ejection fraction was 35%. Diffuse hypokinesis. - Left atrium: The atrium was moderately dilated. - Right ventricle: The cavity size was mildly dilated. Systolic function was mildly reduced.    Neuro/Psych negative neurological ROS  negative psych ROS   GI/Hepatic negative GI ROS, Neg liver ROS,   Endo/Other  Morbid obesity  Renal/GU negative Renal ROS     Musculoskeletal   Abdominal   Peds  Hematology   Anesthesia Other Findings   Reproductive/Obstetrics negative OB ROS                        Anesthesia Physical Anesthesia Plan  ASA: III  Anesthesia Plan: General   Post-op Pain Management:    Induction: Intravenous  Airway Management Planned: Mask  Additional Equipment:   Intra-op Plan:   Post-operative Plan:   Informed Consent: I have reviewed the patients History and Physical, chart, labs and discussed the procedure including the risks, benefits and alternatives for the proposed anesthesia with the patient or authorized representative who has indicated his/her  understanding and acceptance.   Dental advisory given  Plan Discussed with: CRNA, Anesthesiologist and Surgeon  Anesthesia Plan Comments: (Discussed potential allergic cross reactivity with propofol due to egg allergy.  Pt requests that we do not use propofol.)      Anesthesia Quick Evaluation

## 2014-11-20 NOTE — CV Procedure (Signed)
    CARDIOVERSION NOTE  Procedure: Electrical Cardioversion Indications:  Atrial Fibrillation  Procedure Details:  Consent: Risks of procedure as well as the alternatives and risks of each were explained to the (patient/caregiver).  Consent for procedure obtained.  Time Out: Verified patient identification, verified procedure, site/side was marked, verified correct patient position, special equipment/implants available, medications/allergies/relevent history reviewed, required imaging and test results available.  Performed  Patient placed on cardiac monitor, pulse oximetry, supplemental oxygen as necessary.  Sedation given: Etomidate per anesthesia Pacer pads placed anterior and posterior chest.  Cardioverted 2 time(s).  Cardioverted at 150J and 200J biphasic.  Impression: Findings: Post procedure EKG shows: NSR Complications: None Patient did tolerate procedure well.  Plan: 1. Successful DCCV to NSR. Remain on Eliquis. 2. Possible sleep apnea noted during the procedure - consider outpatient sleep study. 3. Follow-up with Dr. Marlou Porch.  Time Spent Directly with the Patient:  30 minutes   Pixie Casino, MD, [facility] Attending Cardiologist [facility]  HILTY,Kenneth C 11/20/2014, 11:52 AM

## 2014-11-20 NOTE — Anesthesia Procedure Notes (Signed)
Date/Time: 11/20/2014 11:38 AM Performed by: Jenne Campus Pre-anesthesia Checklist: Patient identified, Emergency Drugs available, Suction available, Patient being monitored and Timeout performed Patient Re-evaluated:Patient Re-evaluated prior to inductionOxygen Delivery Method: Ambu bag

## 2014-11-21 ENCOUNTER — Encounter (HOSPITAL_COMMUNITY): Payer: Self-pay | Admitting: Internal Medicine

## 2014-11-27 ENCOUNTER — Ambulatory Visit (INDEPENDENT_AMBULATORY_CARE_PROVIDER_SITE_OTHER): Payer: 59 | Admitting: Cardiology

## 2014-11-27 ENCOUNTER — Encounter: Payer: Self-pay | Admitting: Cardiology

## 2014-11-27 VITALS — BP 110/80 | HR 49 | Ht 76.0 in | Wt 274.0 lb

## 2014-11-27 DIAGNOSIS — E669 Obesity, unspecified: Secondary | ICD-10-CM

## 2014-11-27 DIAGNOSIS — I480 Paroxysmal atrial fibrillation: Secondary | ICD-10-CM

## 2014-11-27 DIAGNOSIS — I4819 Other persistent atrial fibrillation: Secondary | ICD-10-CM

## 2014-11-27 DIAGNOSIS — I481 Persistent atrial fibrillation: Secondary | ICD-10-CM

## 2014-11-27 DIAGNOSIS — E785 Hyperlipidemia, unspecified: Secondary | ICD-10-CM

## 2014-11-27 NOTE — Patient Instructions (Signed)
Your physician recommends that you schedule a follow-up appointment in: 3 months with Dr. Marlou Porch  Your physician has requested that you have an echocardiogram. Echocardiography is a painless test that uses sound waves to create images of your heart. It provides your doctor with information about the size and shape of your heart and how well your heart's chambers and valves are working. This procedure takes approximately one hour. There are no restrictions for this procedure. Schedule for same day as 3 month follow up.

## 2014-11-27 NOTE — Progress Notes (Signed)
[facility]. 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 Yong Channel his primary physician who had successful cardioversion on 11/20/14. No chest pain, no shortness of breath, no lightheadedness, no ill effects of atrial fibrillation. According to prior office visit, he may feel like he is urinating slightly less but otherwise does not feel his atrial fibrillation.  He states that after cardioversion he feels better, more energy, less shortness of breath walking up hills.  Had 2 episodes in past year, in altitude, hiking, feeling some dyspnea, in [place]. But has a history of exertional asthma.   He is currently metoprolol twice a day. 25 mg. Takes atorvastatin for hyperlipidemia.   Wt Readings from Last 3 Encounters:  11/27/14 274 lb (124.286 kg)  10/25/14 278 lb (126.1 kg)  09/18/14 277 lb (125.646 kg)     Past Medical History  Diagnosis Date  . Hyperlipidemia   . Diverticulosis   . Vitreous anomalies 2014    Tack down of vitreous tear  . Hypertension     Past Surgical History  Procedure Laterality Date  . Colonoscopy    . Eye surgery  2014  . Cardioversion N/A 11/20/2014    Procedure: CARDIOVERSION;  Surgeon: Pixie Casino, MD;  Location: [facility] ENDOSCOPY;  Service: Cardiovascular;  Laterality: N/A;  11:38 Etomidate given IV for unsuccessful synched cardioversion @ 150 joules from Afib, 11:39 repeated synched cardioversion @ 200 joules successfully from Afib to SR. 12 lead EKG ordered to confirm..    Current Outpatient Prescriptions  Medication Sig Dispense Refill  . apixaban (ELIQUIS) 5 MG TABS tablet Take 1 tablet (5 mg total) by mouth 2 (two) times daily. 60 tablet 11  .  atorvastatin (LIPITOR) 10 MG tablet TAKE 1 TABLET BY MOUTH ONCE DAILY 90 tablet 3  . lisinopril (PRINIVIL,ZESTRIL) 5 MG tablet Take 1 tablet (5 mg total) by mouth daily. 30 tablet 11  . metoprolol tartrate (LOPRESSOR) 25 MG tablet Take 1 tablet (25 mg total) by mouth 2 (two) times daily. 60 tablet 11   No current facility-administered medications for this visit.    Allergies:    Allergies  Allergen Reactions  . Eggs Or Egg-Derived Products     REACTION: no vaccines    Social History:  The patient  reports that he has never smoked. He has never used smokeless tobacco. He reports that he drinks alcohol. He reports that he does not use illicit drugs.   Family History  Problem Relation Age of Onset  . Asthma Mother   . Brain cancer Father     not genetic  . Alzheimer's disease Mother     diagnosed 66  . Other Brother     gilbert disease  . Heart attack Maternal Grandmother   . Heart attack Maternal Uncle     ROS:  Please see the history of present illness.   Denies any strokelike symptoms, fevers, chills, orthopnea, PND, syncope, bleeding, rash, thyroid illness, diarrhea. 10/14 - retinal tear.   All other systems reviewed and negative.   PHYSICAL EXAM: VS:  BP 110/80 mmHg  Pulse 49  Ht 6\' 4"  (1.93 m)  Wt 274 lb (124.286 kg)  BMI 33.37 kg/m2 Well nourished, well developed, in no acute distress HEENT: normal, [place]/AT, EOMI Neck: no JVD, normal carotid upstroke, no bruit Cardiac:  Bradycardic S1, S2; RRR; no murmur Lungs:  clear to auscultation bilaterally, no wheezing, rhonchi or rales Abd: soft, nontender, no hepatomegaly, no bruits Ext: no edema, 2+ distal pulses Skin: warm and dry GU: deferred Neuro: no focal abnormalities noted, AAO x 3  EKG:  09/18/14-atrial fibrillation, heart rate 103, left anterior fascicular block, nonspecific T-wave changes.  11/27/14, 1 week post cardioversion EKG-sinus bradycardia rate 46 with nonspecific ST-T wave changes.  Echocardiogram:  09/18/14-EF 35%, diffuse hypokinesis. Left atrium 47 mm, moderately dilated.    Labs: Creatinine 1.1, potassium 4.8, LDL 82, hemoglobin 16.2  ASSESSMENT AND PLAN:  1. Atrial fibrillation previously persistent-left atrium mildly dilated, EF 35%. Successful cardioversion on 11/20/14 currently with sinus bradycardia rate 49 on EKG on 11/27/14. Previously had atrial fibrillation under good rate control however with metoprolol.  Hopefully his ejection fraction is reduced secondary to tachycardia induced cardiomyopathy. In 3 months, I will check echocardiogram and see him back in clinic. When he was discovered to be in atrial fibrillation his heart rate was in the 130 range. He does note that his minor lower extremity edema has improved since being on the metoprolol, hopefully this is a sign that his cardiomyopathy is improving. Currently his CHADS-VASC score is 2 given ejection fraction of 35% as well as hypertension. At age 17 he would get a point for age. Continue with Eliquis 5 mg twice a day. Hemoglobin and creatinine were normal. 2. Cardiomyopathy-hopefully this is a tachycardia induced cardiomyopathy. We will be rechecking echocardiogram in approximately 90 days. I have started lisinopril 5 g once a day. He is currently on beta blocker. If cardiomyopathy persists despite rate control/restoration of sinus rhythm further workup will be necessary.  3. Obesity-encourage weight loss. This can go hand-in-hand with atrial fibrillation. Discussed low carbohydrate diet 4. Snoring-think about sleep study. We discussed. This can go hand-in-hand with atrial fibrillation as well. Right now he would like to try his mouthpiece again area. 5. Three-month follow-up.  Signed, Candee Furbish, MD [facility]  11/27/2014 10:14 AM

## 2014-12-11 ENCOUNTER — Telehealth: Payer: Self-pay | Admitting: Family Medicine

## 2014-12-11 MED ORDER — ATORVASTATIN CALCIUM 10 MG PO TABS: 10.0000 mg | ORAL_TABLET | Freq: Every day | ORAL | Status: DC

## 2014-12-11 NOTE — Telephone Encounter (Signed)
Patient is requesting re-fill on atorvastatin (LIPITOR) 10 MG tablet sent to [facility], [place]

## 2014-12-11 NOTE — Telephone Encounter (Signed)
Medication refilled

## 2015-01-22 ENCOUNTER — Encounter: Payer: Self-pay | Admitting: Cardiology

## 2015-01-27 ENCOUNTER — Encounter: Payer: Self-pay | Admitting: Cardiology

## 2015-03-05 ENCOUNTER — Other Ambulatory Visit: Payer: Self-pay

## 2015-03-05 ENCOUNTER — Encounter: Payer: Self-pay | Admitting: Cardiology

## 2015-03-05 ENCOUNTER — Ambulatory Visit (INDEPENDENT_AMBULATORY_CARE_PROVIDER_SITE_OTHER): Payer: 59 | Admitting: Cardiology

## 2015-03-05 ENCOUNTER — Other Ambulatory Visit (HOSPITAL_COMMUNITY): Payer: 59

## 2015-03-05 ENCOUNTER — Ambulatory Visit (HOSPITAL_COMMUNITY): Payer: 59 | Attending: Cardiovascular Disease

## 2015-03-05 VITALS — BP 118/82 | HR 99 | Ht 76.0 in | Wt 270.0 lb

## 2015-03-05 DIAGNOSIS — E669 Obesity, unspecified: Secondary | ICD-10-CM | POA: Diagnosis not present

## 2015-03-05 DIAGNOSIS — I480 Paroxysmal atrial fibrillation: Secondary | ICD-10-CM | POA: Insufficient documentation

## 2015-03-05 DIAGNOSIS — I10 Essential (primary) hypertension: Secondary | ICD-10-CM | POA: Insufficient documentation

## 2015-03-05 DIAGNOSIS — I4819 Other persistent atrial fibrillation: Secondary | ICD-10-CM

## 2015-03-05 DIAGNOSIS — E785 Hyperlipidemia, unspecified: Secondary | ICD-10-CM | POA: Diagnosis not present

## 2015-03-05 DIAGNOSIS — I481 Persistent atrial fibrillation: Secondary | ICD-10-CM | POA: Diagnosis not present

## 2015-03-05 MED ORDER — METOPROLOL SUCCINATE ER 100 MG PO TB24: 100.0000 mg | ORAL_TABLET | Freq: Every day | ORAL | Status: DC

## 2015-03-05 NOTE — Progress Notes (Signed)
[facility]. 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 Yong Channel his primary physician who had successful cardioversion on 11/20/14 but quickly reverted back to atrial fibrillation likely a month later. Left atrial size is moderately dilated. Original ejection fraction was 35%. Heart rate originally upon diagnosis was 130. No chest pain, no shortness of breath, no lightheadedness, no ill effects of atrial fibrillation. According to prior office visit, he may feel like he is urinating slightly less but otherwise does not feel his atrial fibrillation.  He states that after cardioversion he feels better, more energy, less shortness of breath walking up hills.  Had 2 episodes in past year, in altitude, hiking, feeling some dyspnea, in [place]. But has a history of exertional asthma. He also has quite significant seasonal allergies especially in April leading to proctitis type symptoms.  Takes atorvastatin for hyperlipidemia.   Wt Readings from Last 3 Encounters:  03/05/15 270 lb (122.471 kg)  11/27/14 274 lb (124.286 kg)  10/25/14 278 lb (126.1 kg)     Past Medical History  Diagnosis Date  . Hyperlipidemia   . Diverticulosis   . Vitreous anomalies 2014    Tack down of vitreous tear  . Hypertension     Past Surgical History  Procedure Laterality Date  . Colonoscopy    . Eye surgery  2014  . Cardioversion N/A 11/20/2014    Procedure: CARDIOVERSION;  Surgeon: Pixie Casino, MD;  Location: [facility] ENDOSCOPY;  Service: Cardiovascular;  Laterality: N/A;  11:38 Etomidate given IV for unsuccessful synched cardioversion @ 150 joules from Afib, 11:39 repeated synched cardioversion @ 200 joules  successfully from Afib to SR. 12 lead EKG ordered to confirm..    Current Outpatient Prescriptions  Medication Sig Dispense Refill  . apixaban (ELIQUIS) 5 MG TABS tablet Take 1 tablet (5 mg total) by mouth 2 (two) times daily. 60 tablet 11  . atorvastatin (LIPITOR) 10 MG tablet Take 1 tablet (10 mg total) by mouth daily. 90 tablet 3  . guaifenesin (HUMIBID E) 400 MG TABS tablet Take 400 mg by mouth 2 (two) times daily before a meal.    . lisinopril (PRINIVIL,ZESTRIL) 5 MG tablet Take 1 tablet (5 mg total) by mouth daily. 30 tablet 11  . loratadine (CLARITIN) 10 MG tablet Take 10 mg by mouth daily.    . metoprolol tartrate (LOPRESSOR) 25 MG tablet Take 1 tablet (25 mg total) by mouth 2 (two) times daily. 60 tablet 11   No current facility-administered medications for this visit.    Allergies:    Allergies  Allergen Reactions  . Eggs Or Egg-Derived Products     REACTION: no vaccines    Social History:  The patient  reports that he has never smoked. He has never used smokeless tobacco. He reports that he drinks alcohol. He reports that he does not use illicit drugs.   Family History  Problem Relation Age of Onset  . Asthma Mother   . Brain cancer Father     not genetic  . Alzheimer's disease Mother     diagnosed 76  . Other Brother     gilbert disease  . Heart attack Maternal  Grandmother   . Heart attack Maternal Uncle     ROS:  Please see the history of present illness.   Denies any strokelike symptoms, fevers, chills, orthopnea, PND, syncope, bleeding, rash, thyroid illness, diarrhea. 10/14 - retinal tear.   All other systems reviewed and negative.   PHYSICAL EXAM: VS:  BP 118/82 mmHg  Pulse 99  Ht 6\' 4"  (1.93 m)  Wt 270 lb (122.471 kg)  BMI 32.88 kg/m2 Well nourished, well developed, in no acute distress HEENT: normal, [place]/AT, EOMI Neck: no JVD, normal carotid upstroke, no bruit Cardiac:  Bradycardic S1, S2; RRR; no murmur Lungs:  clear to auscultation bilaterally, no  wheezing, rhonchi or rales Abd: soft, nontender, no hepatomegaly, no bruits Ext: no edema, 2+ distal pulses Skin: warm and dry GU: deferred Neuro: no focal abnormalities noted, AAO x 3  EKG:  09/18/14-atrial fibrillation, heart rate 103, left anterior fascicular block, nonspecific T-wave changes.  11/27/14, 1 week post cardioversion EKG-sinus bradycardia rate 46 with nonspecific ST-T wave changes.  Echocardiogram: 09/18/14-EF 35%, diffuse hypokinesis. Left atrium 47 mm, moderately dilated.    Labs: Creatinine 1.1, potassium 4.8, LDL 82, hemoglobin 16.2  ASSESSMENT AND PLAN:  1. Atrial fibrillation persistent-left atrium moderately dilated, EF 35% previously. Successful cardioversion on 11/20/14 only for approximately one month. Heart rate in sinus rhythm was actually 49 bpm, hence the low-dose of metoprolol. Now however he is converted back to atrial fibrillation and his heart rate is 99 bpm on EKG. We will go ahead and increase his metoprolol to 100 mg a day of extended release. This will be helpful with cardiomyopathy as well. Checking echocardiogram today. Results currently pending.  Previously had atrial fibrillation under good rate control however with metoprolol.  Hopefully his ejection fraction is reduced secondary to tachycardia induced cardiomyopathy. When he was discovered to be in atrial fibrillation his heart rate was in the 130 range. He does note that his minor lower extremity edema has improved since being on the metoprolol, hopefully this is a sign that his cardiomyopathy is improving. Currently his CHADS-VASC score is 2 given ejection fraction of 35% as well as hypertension. At age 7 he would get a point for age. Continue with Eliquis 5 mg twice a day. Hemoglobin and creatinine were normal. 2. Cardiomyopathy-hopefully this is a tachycardia induced cardiomyopathy. Echocardiogram results pending. I have started lisinopril 5 g once a day. He is currently on beta blocker. If  cardiomyopathy persists despite rate control/restoration of sinus rhythm further workup will be necessary.  3. Obesity-encourage weight loss. This can go hand-in-hand with atrial fibrillation. Discussed low carbohydrate diet 4. Snoring-think about sleep study. We discussed. This can go hand-in-hand with atrial fibrillation as well. Right now he would like to try his mouthpiece again area. 5. Hyperlipidemia-continue with atorvastatin. 6. 26-month follow-up.  Signed, Candee Furbish, MD [facility]  03/05/2015 8:43 AM

## 2015-03-05 NOTE — Patient Instructions (Signed)
Medication Instructions:  Please stop the Metoprolol tartrate and start Metoprolol succinate 100 mg once a day. Continue all other medications as listed.  Follow-Up: Follow up in 1 month with Dr Marlou Porch.  Thank you for choosing [facility]!!

## 2015-04-02 ENCOUNTER — Encounter: Payer: Self-pay | Admitting: *Deleted

## 2015-04-02 ENCOUNTER — Encounter: Payer: Self-pay | Admitting: Cardiology

## 2015-04-02 ENCOUNTER — Ambulatory Visit (INDEPENDENT_AMBULATORY_CARE_PROVIDER_SITE_OTHER): Payer: 59 | Admitting: Cardiology

## 2015-04-02 VITALS — BP 110/70 | HR 63 | Ht 76.0 in | Wt 270.8 lb

## 2015-04-02 DIAGNOSIS — I481 Persistent atrial fibrillation: Secondary | ICD-10-CM

## 2015-04-02 DIAGNOSIS — E785 Hyperlipidemia, unspecified: Secondary | ICD-10-CM | POA: Diagnosis not present

## 2015-04-02 DIAGNOSIS — E669 Obesity, unspecified: Secondary | ICD-10-CM | POA: Diagnosis not present

## 2015-04-02 DIAGNOSIS — I4819 Other persistent atrial fibrillation: Secondary | ICD-10-CM

## 2015-04-02 DIAGNOSIS — Z01810 Encounter for preprocedural cardiovascular examination: Secondary | ICD-10-CM

## 2015-04-02 DIAGNOSIS — I429 Cardiomyopathy, unspecified: Secondary | ICD-10-CM

## 2015-04-02 DIAGNOSIS — I428 Other cardiomyopathies: Secondary | ICD-10-CM | POA: Insufficient documentation

## 2015-04-02 NOTE — Patient Instructions (Signed)
Medication Instructions:  Your physician recommends that you continue on your current medications as directed. Please refer to the Current Medication list given to you today.  Labwork: Please return for blood work on 04/24/2015.  Testing/Procedures: Your physician has requested that you have a cardiac catheterization on 04/30/15. Cardiac catheterization is used to diagnose and/or treat various heart conditions. Doctors may recommend this procedure for a number of different reasons. The most common reason is to evaluate chest pain. Chest pain can be a symptom of coronary artery disease (CAD), and cardiac catheterization can show whether plaque is narrowing or blocking your heart's arteries. This procedure is also used to evaluate the valves, as well as measure the blood flow and oxygen levels in different parts of your heart. For further information please visit [email or web address]. Please follow instruction sheet, as given.  Follow-Up: Follow up 2 weeks after your cath.  Thank you for choosing [facility]!!

## 2015-04-02 NOTE — Progress Notes (Signed)
[facility]. 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 Yong Channel his primary physician who had successful cardioversion on 11/20/14 but quickly reverted back to atrial fibrillation likely a month later. Left atrial size is moderately dilated. Original ejection fraction was 35%. EF remains less than 35% on repeat after medical therapy. Heart rate originally upon diagnosis was 130. No chest pain, no shortness of breath, no lightheadedness, no ill effects of atrial fibrillation. According to prior office visit, he may feel like he is urinating slightly less but otherwise does not feel his atrial fibrillation.  He states that after cardioversion he feels better, more energy, less shortness of breath walking up hills.  Had 2 episodes in past year, in altitude, hiking, feeling some dyspnea, in [place]. But has a history of exertional asthma. He also has quite significant seasonal allergies especially in April leading to proctitis type symptoms.  Takes atorvastatin for hyperlipidemia.  Overall doing well. No symptoms from medication. No bleeding.   Wt Readings from Last 3 Encounters:  04/02/15 270 lb 12.8 oz (122.834 kg)  03/05/15 270 lb (122.471 kg)  11/27/14 274 lb (124.286 kg)     Past Medical History  Diagnosis Date  . Hyperlipidemia   . Diverticulosis   . Vitreous anomalies 2014    Tack down of vitreous tear  . Hypertension     Past Surgical History  Procedure Laterality Date  . Colonoscopy    . Eye surgery  2014  . Cardioversion N/A 11/20/2014    Procedure: CARDIOVERSION;  Surgeon: Pixie Casino, MD;  Location: [facility] ENDOSCOPY;  Service: Cardiovascular;  Laterality: N/A;   11:38 Etomidate given IV for unsuccessful synched cardioversion @ 150 joules from Afib, 11:39 repeated synched cardioversion @ 200 joules successfully from Afib to SR. 12 lead EKG ordered to confirm..    Current Outpatient Prescriptions  Medication Sig Dispense Refill  . apixaban (ELIQUIS) 5 MG TABS tablet Take 1 tablet (5 mg total) by mouth 2 (two) times daily. 60 tablet 11  . atorvastatin (LIPITOR) 10 MG tablet Take 1 tablet (10 mg total) by mouth daily. 90 tablet 3  . lisinopril (PRINIVIL,ZESTRIL) 5 MG tablet Take 1 tablet (5 mg total) by mouth daily. 30 tablet 11  . metoprolol succinate (TOPROL-XL) 100 MG 24 hr tablet Take 1 tablet (100 mg total) by mouth daily. Take with or immediately following a meal. 90 tablet 3   No current facility-administered medications for this visit.    Allergies:    Allergies  Allergen Reactions  . Eggs Or Egg-Derived Products     REACTION: no vaccines    Social History:  The patient  reports that he has never smoked. He has never used smokeless tobacco. He reports that he drinks alcohol. He reports that he does not use illicit drugs.   Family History  Problem Relation Age of Onset  . Asthma Mother   . Brain cancer Father     not genetic  . Alzheimer's disease Mother     diagnosed 46  . Other Brother     gilbert disease  . Heart attack Maternal Grandmother   . Heart attack Maternal Uncle  ROS:  Please see the history of present illness.   Denies any strokelike symptoms, fevers, chills, orthopnea, PND, syncope, bleeding, rash, thyroid illness, diarrhea. 10/14 - retinal tear.   All other systems reviewed and negative.   PHYSICAL EXAM: VS:  BP 110/70 mmHg  Pulse 63  Ht 6\' 4"  (1.93 m)  Wt 270 lb 12.8 oz (122.834 kg)  BMI 32.98 kg/m2 Well nourished, well developed, in no acute distress HEENT: normal, [place]/AT, EOMI Neck: no JVD, normal carotid upstroke, no bruit Cardiac:  Bradycardic S1, S2; RRR; no murmur Lungs:  clear to auscultation  bilaterally, no wheezing, rhonchi or rales Abd: soft, nontender, no hepatomegaly, no bruits Ext: no edema, 2+ distal pulses Skin: warm and dry GU: deferred Neuro: no focal abnormalities noted, AAO x 3  EKG:  09/18/14-atrial fibrillation, heart rate 103, left anterior fascicular block, nonspecific T-wave changes.  11/27/14, 1 week post cardioversion EKG-sinus bradycardia rate 46 with nonspecific ST-T wave changes.  Echocardiogram: 09/18/14-EF 35%, diffuse hypokinesis. Left atrium 47 mm, moderately dilated.    Labs: Creatinine 1.1, potassium 4.8, LDL 82, hemoglobin 16.2  ASSESSMENT AND PLAN:  1. Atrial fibrillation persistent-left atrium moderately dilated, EF 35% continues. Successful cardioversion on 11/20/14 only for approximately one month. Heart rate in sinus rhythm was actually 49 bpm, hence the low-dose of metoprolol. Now however he is converted back to atrial fibrillation and his heart rate is 99 bpm on EKG. We will go ahead and increase his metoprolol to 100 mg a day of extended release. This will be helpful with cardiomyopathy as well. When he was discovered to be in atrial fibrillation his heart rate was in the 130 range. However with good overall rate control, his ejection fraction still remains less than 35% He does note that his minor lower extremity edema has improved since being on the metoprolol. Currently his CHADS-VASC score is 2 given ejection fraction of 35% as well as hypertension. At age 75 he would get a point for age. Continue with Eliquis 5 mg twice a day. Hemoglobin and creatinine were normal. 2. Cardiomyopathy-may have excluded tachycardia induced cardiomyopathy given that with adequate rate control he still remains less than 35%. Continue with beta blocker and ACE inhibitor. He seems euvolemic. We have discussed cardiac catheterization, we'll proceed with left heart catheterization, risks and benefits including stroke, heart attack, death, renal impairment had been discussed.  When he was a young child in the fourth grade he had IVP procedure and remembers having hives with this. This was a very common reaction to the amount and type of eye donated contrast that was used several years ago. He has not demonstrated any allergic reaction to shellfish. I think would be reasonable to go ahead and proceed without prophylaxis however monitor for any signs of reaction.  he will have to hold his Eliquis today prior to procedure.  3. Obesity-encourage weight loss. This can go hand-in-hand with atrial fibrillation. Discussed low carbohydrate diet 4. Snoring-think about sleep study. We discussed. This can go hand-in-hand with atrial fibrillation as well. Right now he would like to try his mouthpiece again area. 5. Hyperlipidemia-continue with atorvastatin. 6. 2 week post cath follow.  Signed, Candee Furbish, MD [facility]  04/02/2015 9:01 AM

## 2015-04-03 NOTE — Addendum Note (Signed)
Addended by: Candee Furbish C on: 04/03/2015 11:00 AM   Modules accepted: Orders

## 2015-04-24 ENCOUNTER — Other Ambulatory Visit (INDEPENDENT_AMBULATORY_CARE_PROVIDER_SITE_OTHER): Payer: 59 | Admitting: *Deleted

## 2015-04-24 DIAGNOSIS — Z01810 Encounter for preprocedural cardiovascular examination: Secondary | ICD-10-CM

## 2015-04-24 DIAGNOSIS — I4819 Other persistent atrial fibrillation: Secondary | ICD-10-CM

## 2015-04-24 DIAGNOSIS — I481 Persistent atrial fibrillation: Secondary | ICD-10-CM | POA: Diagnosis not present

## 2015-04-24 DIAGNOSIS — I429 Cardiomyopathy, unspecified: Secondary | ICD-10-CM | POA: Diagnosis not present

## 2015-04-24 LAB — CBC
HEMATOCRIT: 47.6 % (ref 39.0–52.0)
Hemoglobin: 15.8 g/dL (ref 13.0–17.0)
MCHC: 33.3 g/dL (ref 30.0–36.0)
MCV: 90.2 fl (ref 78.0–100.0)
Platelets: 155 10*3/uL (ref 150.0–400.0)
RBC: 5.27 Mil/uL (ref 4.22–5.81)
RDW: 13.7 % (ref 11.5–15.5)
WBC: 5.8 10*3/uL (ref 4.0–10.5)

## 2015-04-24 LAB — BASIC METABOLIC PANEL
BUN: 21 mg/dL (ref 6–23)
CO2: 29 mEq/L (ref 19–32)
CREATININE: 1.1 mg/dL (ref 0.40–1.50)
Calcium: 9 mg/dL (ref 8.4–10.5)
Chloride: 105 mEq/L (ref 96–112)
GFR: 72.45 mL/min (ref >60.00)
Glucose, Bld: 91 mg/dL (ref 70–99)
Potassium: 4.5 mEq/L (ref 3.5–5.1)
Sodium: 140 mEq/L (ref 135–145)

## 2015-04-30 ENCOUNTER — Encounter (HOSPITAL_COMMUNITY)
Admission: RE | Disposition: A | Discharge status: Home / Self Care | Payer: 59 | Source: Ambulatory Visit | Attending: Cardiology

## 2015-04-30 ENCOUNTER — Encounter (HOSPITAL_COMMUNITY): Payer: Self-pay | Admitting: Cardiology

## 2015-04-30 ENCOUNTER — Ambulatory Visit (HOSPITAL_COMMUNITY)
Admission: RE | Admit: 2015-04-30 | Discharge: 2015-04-30 | Disposition: A | Discharge status: Home / Self Care | Payer: 59 | Source: Ambulatory Visit | Attending: Cardiology | Admitting: Cardiology

## 2015-04-30 DIAGNOSIS — E785 Hyperlipidemia, unspecified: Secondary | ICD-10-CM | POA: Insufficient documentation

## 2015-04-30 DIAGNOSIS — I429 Cardiomyopathy, unspecified: Secondary | ICD-10-CM | POA: Diagnosis not present

## 2015-04-30 DIAGNOSIS — R0683 Snoring: Secondary | ICD-10-CM | POA: Diagnosis not present

## 2015-04-30 DIAGNOSIS — I481 Persistent atrial fibrillation: Secondary | ICD-10-CM | POA: Diagnosis not present

## 2015-04-30 DIAGNOSIS — I4819 Other persistent atrial fibrillation: Secondary | ICD-10-CM | POA: Diagnosis present

## 2015-04-30 DIAGNOSIS — I10 Essential (primary) hypertension: Secondary | ICD-10-CM | POA: Insufficient documentation

## 2015-04-30 DIAGNOSIS — E669 Obesity, unspecified: Secondary | ICD-10-CM | POA: Diagnosis not present

## 2015-04-30 DIAGNOSIS — Z7901 Long term (current) use of anticoagulants: Secondary | ICD-10-CM | POA: Diagnosis not present

## 2015-04-30 DIAGNOSIS — I428 Other cardiomyopathies: Secondary | ICD-10-CM

## 2015-04-30 HISTORY — PX: CARDIAC CATHETERIZATION: SHX172

## 2015-04-30 SURGERY — LEFT HEART CATH AND CORONARY ANGIOGRAPHY

## 2015-04-30 MED ORDER — VERAPAMIL HCL 2.5 MG/ML IV SOLN
INTRAVENOUS | Status: DC | PRN
  Administered 2015-04-30: 09:00:00 via INTRA_ARTERIAL

## 2015-04-30 MED ORDER — HEPARIN (PORCINE) IN NACL 2-0.9 UNIT/ML-% IJ SOLN
INTRAMUSCULAR | Status: AC
  Filled 2015-04-30: qty 1500

## 2015-04-30 MED ORDER — FENTANYL CITRATE (PF) 100 MCG/2ML IJ SOLN
INTRAMUSCULAR | Status: DC | PRN
  Administered 2015-04-30: 25 ug via INTRAVENOUS

## 2015-04-30 MED ORDER — SODIUM CHLORIDE 0.9 % IV SOLN: 250.0000 mL | INTRAVENOUS | Status: DC | PRN

## 2015-04-30 MED ORDER — SODIUM CHLORIDE 0.9 % IJ SOLN: 3.0000 mL | INTRAMUSCULAR | Status: DC | PRN

## 2015-04-30 MED ORDER — SODIUM CHLORIDE 0.9 % WEIGHT BASED INFUSION: 3.0000 mL/kg/h | INTRAVENOUS | Status: DC

## 2015-04-30 MED ORDER — SODIUM CHLORIDE 0.9 % IJ SOLN: 3.0000 mL | Freq: Two times a day (BID) | INTRAMUSCULAR | Status: DC

## 2015-04-30 MED ORDER — MIDAZOLAM HCL 2 MG/2ML IJ SOLN
INTRAMUSCULAR | Status: DC | PRN
  Administered 2015-04-30: 2 mg via INTRAVENOUS

## 2015-04-30 MED ORDER — HEPARIN SODIUM (PORCINE) 1000 UNIT/ML IJ SOLN
INTRAMUSCULAR | Status: AC
  Filled 2015-04-30: qty 1

## 2015-04-30 MED ORDER — MIDAZOLAM HCL 2 MG/2ML IJ SOLN
INTRAMUSCULAR | Status: AC
  Filled 2015-04-30: qty 2

## 2015-04-30 MED ORDER — VERAPAMIL HCL 2.5 MG/ML IV SOLN
INTRAVENOUS | Status: AC
  Filled 2015-04-30: qty 2

## 2015-04-30 MED ORDER — ASPIRIN 81 MG PO CHEW
CHEWABLE_TABLET | ORAL | Status: AC
  Filled 2015-04-30: qty 1

## 2015-04-30 MED ORDER — LIDOCAINE HCL (PF) 1 % IJ SOLN
INTRAMUSCULAR | Status: AC
  Filled 2015-04-30: qty 30

## 2015-04-30 MED ORDER — SODIUM CHLORIDE 0.9 % WEIGHT BASED INFUSION
3.0000 mL/kg/h | INTRAVENOUS | Status: DC
  Administered 2015-04-30: 3 mL/kg/h via INTRAVENOUS

## 2015-04-30 MED ORDER — HEPARIN SODIUM (PORCINE) 1000 UNIT/ML IJ SOLN
INTRAMUSCULAR | Status: DC | PRN
  Administered 2015-04-30: 5000 [IU] via INTRAVENOUS

## 2015-04-30 MED ORDER — SODIUM CHLORIDE 0.9 % WEIGHT BASED INFUSION: 1.0000 mL/kg/h | INTRAVENOUS | Status: DC

## 2015-04-30 MED ORDER — IOHEXOL 350 MG/ML SOLN
INTRAVENOUS | Status: DC | PRN
  Administered 2015-04-30: 70 mL via INTRAVENOUS

## 2015-04-30 MED ORDER — FENTANYL CITRATE (PF) 100 MCG/2ML IJ SOLN
INTRAMUSCULAR | Status: AC
  Filled 2015-04-30: qty 2

## 2015-04-30 MED ORDER — ASPIRIN 81 MG PO CHEW
81.0000 mg | CHEWABLE_TABLET | ORAL | Status: AC
  Administered 2015-04-30: 81 mg via ORAL

## 2015-04-30 SURGICAL SUPPLY — 11 items

## 2015-04-30 NOTE — Progress Notes (Signed)
Cath Lab Visit (complete for each Cath Lab visit)  Clinical Evaluation Leading to the Procedure:   ACS: No.  Non-ACS:    Anginal Classification: CCS I  Anti-ischemic medical therapy: Minimal Therapy (1 class of medications)  Non-Invasive Test Results: No non-invasive testing performed  Prior CABG: No previous CABG     Candee Furbish, MD

## 2015-04-30 NOTE — Interval H&P Note (Signed)
History and Physical Interval Note:  04/30/2015 8:37 AM  Elta Guadeloupe Case Podgorski  has presented today for surgery, with the diagnosis of cm  The various methods of treatment have been discussed with the patient and family. After consideration of risks, benefits and other options for treatment, the patient has consented to  Procedure(s): Left Heart Cath and Coronary Angiography (N/A) as a surgical intervention .  The patient's history has been reviewed, patient examined, no change in status, stable for surgery.  I have reviewed the patient's chart and labs.  Questions were answered to the patient's satisfaction.     Douglas Rooks, [place]

## 2015-04-30 NOTE — H&P (View-Only) (Signed)
[facility]. 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 Yong Aguilar his primary physician who had successful cardioversion on 11/20/14 but quickly reverted back to atrial fibrillation likely a month later. Left atrial size is moderately dilated. Original ejection fraction was 35%. EF remains less than 35% on repeat after medical therapy. Heart rate originally upon diagnosis was 130. No chest pain, no shortness of breath, no lightheadedness, no ill effects of atrial fibrillation. According to prior office visit, he may feel like he is urinating slightly less but otherwise does not feel his atrial fibrillation.  He states that after cardioversion he feels better, more energy, less shortness of breath walking up hills.  Had 2 episodes in past year, in altitude, hiking, feeling some dyspnea, in [place]. But has a history of exertional asthma. He also has quite significant seasonal allergies especially in April leading to proctitis type symptoms.  Takes atorvastatin for hyperlipidemia.  Overall doing well. No symptoms from medication. No bleeding.   Wt Readings from Last 3 Encounters:  04/02/15 270 lb 12.8 oz (122.834 kg)  03/05/15 270 lb (122.471 kg)  11/27/14 274 lb (124.286 kg)     Past Medical History  Diagnosis Date  . Hyperlipidemia   . Diverticulosis   . Vitreous anomalies 2014    Tack down of vitreous tear  . Hypertension     Past Surgical History  Procedure Laterality Date  . Colonoscopy    . Eye surgery  2014  . Cardioversion N/A 11/20/2014    Procedure: CARDIOVERSION;  Surgeon: [name] Casino, MD;  Location: [facility] ENDOSCOPY;  Service: Cardiovascular;  Laterality: N/A;   11:38 Etomidate given IV for unsuccessful synched cardioversion @ 150 joules from Afib, 11:39 repeated synched cardioversion @ 200 joules successfully from Afib to SR. 12 lead EKG ordered to confirm..    Current Outpatient Prescriptions  Medication Sig Dispense Refill  . apixaban (ELIQUIS) 5 MG TABS tablet Take 1 tablet (5 mg total) by mouth 2 (two) times daily. 60 tablet 11  . atorvastatin (LIPITOR) 10 MG tablet Take 1 tablet (10 mg total) by mouth daily. 90 tablet 3  . lisinopril (PRINIVIL,ZESTRIL) 5 MG tablet Take 1 tablet (5 mg total) by mouth daily. 30 tablet 11  . metoprolol succinate (TOPROL-XL) 100 MG 24 hr tablet Take 1 tablet (100 mg total) by mouth daily. Take with or immediately following a meal. 90 tablet 3   No current facility-administered medications for this visit.    Allergies:    Allergies  Allergen Reactions  . Eggs Or Egg-Derived Products     REACTION: no vaccines    Social History:  The patient  reports that he has never smoked. He has never used smokeless tobacco. He reports that he drinks alcohol. He reports that he does not use illicit drugs.   Family History  Problem Relation Age of Onset  . Asthma Mother   . Brain cancer Father     not genetic  . Alzheimer's disease Mother     diagnosed 6  . Other Brother     gilbert disease  . Heart attack Maternal Grandmother   . Heart attack Maternal Uncle  ROS:  Please see the history of present illness.   Denies any strokelike symptoms, fevers, chills, orthopnea, PND, syncope, bleeding, rash, thyroid illness, diarrhea. 10/14 - retinal tear.   All other systems reviewed and negative.   PHYSICAL EXAM: VS:  BP 110/70 mmHg  Pulse 63  Ht 6\' 4"  (1.93 m)  Wt 270 lb 12.8 oz (122.834 kg)  BMI 32.98 kg/m2 Well nourished, well developed, in no acute distress HEENT: normal, [place]/AT, EOMI Neck: no JVD, normal carotid upstroke, no bruit Cardiac:  Bradycardic S1, S2; RRR; no murmur Lungs:  clear to auscultation  bilaterally, no wheezing, rhonchi or rales Abd: soft, nontender, no hepatomegaly, no bruits Ext: no edema, 2+ distal pulses Skin: warm and dry GU: deferred Neuro: no focal abnormalities noted, AAO x 3  EKG:  09/18/14-atrial fibrillation, heart rate 103, left anterior fascicular block, nonspecific T-wave changes.  11/27/14, 1 week post cardioversion EKG-sinus bradycardia rate 46 with nonspecific ST-T wave changes.  Echocardiogram: 09/18/14-EF 35%, diffuse hypokinesis. Left atrium 47 mm, moderately dilated.    Labs: Creatinine 1.1, potassium 4.8, LDL 82, hemoglobin 16.2  ASSESSMENT AND PLAN:  1. Atrial fibrillation persistent-left atrium moderately dilated, EF 35% continues. Successful cardioversion on 11/20/14 only for approximately one month. Heart rate in sinus rhythm was actually 49 bpm, hence the low-dose of metoprolol. Now however he is converted back to atrial fibrillation and his heart rate is 99 bpm on EKG. We will go ahead and increase his metoprolol to 100 mg a day of extended release. This will be helpful with cardiomyopathy as well. When he was discovered to be in atrial fibrillation his heart rate was in the 130 range. However with good overall rate control, his ejection fraction still remains less than 35% He does note that his minor lower extremity edema has improved since being on the metoprolol. Currently his CHADS-VASC score is 2 given ejection fraction of 35% as well as hypertension. At age 3 he would get a point for age. Continue with Eliquis 5 mg twice a day. Hemoglobin and creatinine were normal. 2. Cardiomyopathy-may have excluded tachycardia induced cardiomyopathy given that with adequate rate control he still remains less than 35%. Continue with beta blocker and ACE inhibitor. He seems euvolemic. We have discussed cardiac catheterization, we'll proceed with left heart catheterization, risks and benefits including stroke, heart attack, death, renal impairment had been discussed.  When he was a young child in the fourth grade he had IVP procedure and remembers having hives with this. This was a very common reaction to the amount and type of eye donated contrast that was used several years ago. He has not demonstrated any allergic reaction to shellfish. I think would be reasonable to go ahead and proceed without prophylaxis however monitor for any signs of reaction.  he will have to hold his Eliquis today prior to procedure.  3. Obesity-encourage weight loss. This can go hand-in-hand with atrial fibrillation. Discussed low carbohydrate diet 4. Snoring-think about sleep study. We discussed. This can go hand-in-hand with atrial fibrillation as well. Right now he would like to try his mouthpiece again area. 5. Hyperlipidemia-continue with atorvastatin. 6. 2 week post cath follow.  Signed, Candee Furbish, MD [facility]  04/02/2015 9:01 AM

## 2015-04-30 NOTE — Discharge Instructions (Signed)
Radial Site Care °Refer to this sheet in the next few weeks. These instructions provide you with information on caring for yourself after your procedure. Your caregiver may also give you more specific instructions. Your treatment has been planned according to current medical practices, but problems sometimes occur. Call your caregiver if you have any problems or questions after your procedure. °HOME CARE INSTRUCTIONS °· You may shower the day after the procedure. Remove the bandage (dressing) and gently wash the site with plain soap and water. Gently pat the site dry. °· Do not apply powder or lotion to the site. °· Do not submerge the affected site in water for 3 to 5 days. °· Inspect the site at least twice daily. °· Do not flex or bend the affected arm for 24 hours. °· No lifting over 5 pounds (2.3 kg) for 5 days after your procedure. °· Do not drive home if you are discharged the same day of the procedure. Have someone else drive you. °· You may drive 24 hours after the procedure unless otherwise instructed by your caregiver. °· Do not operate machinery or power tools for 24 hours. °· A responsible adult should be with you for the first 24 hours after you arrive home. °What to expect: °· Any bruising will usually fade within 1 to 2 weeks. °· Blood that collects in the tissue (hematoma) may be painful to the touch. It should usually decrease in size and tenderness within 1 to 2 weeks. °SEEK IMMEDIATE MEDICAL CARE IF: °· You have unusual pain at the radial site. °· You have redness, warmth, swelling, or pain at the radial site. °· You have drainage (other than a small amount of blood on the dressing). °· You have chills. °· You have a fever or persistent symptoms for more than 72 hours. °· You have a fever and your symptoms suddenly get worse. °· Your arm becomes pale, cool, tingly, or numb. °· You have heavy bleeding from the site. Hold pressure on the site. °Document Released: 11/06/2010 Document Revised:  12/27/2011 Document Reviewed: 11/06/2010 °ExitCare® Patient Information ©2015 [employer]. This information is not intended to replace advice given to you by your health care provider. Make sure you discuss any questions you have with your health care provider. ° °

## 2015-05-01 MED FILL — Lidocaine HCl Local Preservative Free (PF) Inj 1%: INTRAMUSCULAR | Qty: 30 | Status: AC

## 2015-05-01 MED FILL — Heparin Sodium (Porcine) 2 Unit/ML in Sodium Chloride 0.9%: INTRAMUSCULAR | Qty: 1500 | Status: AC

## 2015-05-13 NOTE — Progress Notes (Signed)
Cardiology Office Note   Date:  05/14/2015   ID:  [name] Aguilar, [place] [birth year], MRN [id number]  PCP:  Garret Reddish, MD  Cardiologist:  Dr. Candee Furbish     Chief Complaint  Patient presents with  . 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  Patient had poor rate control when he was initially diagnosed with atrial fibrillation. With improved rate control, his ejection fraction remained 30-35%. Therefore, cardiac catheterization was arranged. This was performed 04/30/15. This demonstrated normal coronary arteries with an EF of 35-45%.   He returns for follow-up.  Here alone. Doing well.  The patient denies chest pain, shortness of breath, syncope, orthopnea, PND or significant pedal edema.     Studies/Reports Reviewed Today:  LHC 04/30/15 LM: Normal LAD: Normal RI: Normal LCx: Normal RCA: Normal EF 35-45% Nonischemic cardiomyopathy  Echo 03/05/15 Mild LVH, EF 30-35%, diffuse HK Mild MR Moderate LAE Mild RVE with mildly reduced RVSF Mild RAE  Past Medical History  Diagnosis Date  . Hyperlipidemia   . Diverticulosis   . Vitreous anomalies 2014    Tack down of vitreous tear  . Hypertension     Past Surgical History  Procedure Laterality Date  . Colonoscopy    . Eye surgery  2014  . Cardioversion N/A 11/20/2014    Procedure: CARDIOVERSION;  Surgeon: Pixie Casino, MD;  Location: [facility] ENDOSCOPY;  Service: Cardiovascular;  Laterality: N/A;  11:38 Etomidate given IV for unsuccessful synched cardioversion @ 150 joules from Afib, 11:39 repeated synched cardioversion @ 200 joules successfully from Afib to SR. 12 lead EKG ordered to confirm..  . Cardiac catheterization N/A 04/30/2015    Procedure: Left Heart Cath and Coronary Angiography;  Surgeon: Jerline Pain, MD;  Location: [facility] CV LAB;  Service: Cardiovascular;   Laterality: N/A;     Current Outpatient Prescriptions  Medication Sig Dispense Refill  . apixaban (ELIQUIS) 5 MG TABS tablet Take 1 tablet (5 mg total) by mouth 2 (two) times daily. 60 tablet 11  . atorvastatin (LIPITOR) 10 MG tablet Take 1 tablet (10 mg total) by mouth daily. 90 tablet 3  . lisinopril (PRINIVIL,ZESTRIL) 5 MG tablet Take 1 tablet (5 mg total) by mouth daily. 30 tablet 11  . metoprolol succinate (TOPROL-XL) 100 MG 24 hr tablet Take 1 tablet (100 mg total) by mouth daily. Take with or immediately following a meal. 90 tablet 3  . metoprolol succinate (TOPROL-XL) 25 MG 24 hr tablet Take 1 tablet (25 mg total) by mouth daily. 90 tablet 3   No current facility-administered medications for this visit.    Allergies:   Eggs or egg-derived products    Social History:  The patient  reports that he has never smoked. He has never used smokeless tobacco. He reports that he drinks alcohol. He reports that he does not use illicit drugs.   Family History:  The patient's family history includes Alzheimer's disease in his mother; Asthma in his mother; Brain cancer in his father; Heart attack in his maternal grandmother and maternal uncle; Other in his brother.    ROS:   Please see the history of present illness.   Review of Systems  Respiratory: Positive for snoring.   Musculoskeletal: Positive for back pain and myalgias.  All other systems reviewed and are negative.     PHYSICAL EXAM: VS:  BP 118/60 mmHg  Pulse 84  Ht 6\' 4"  (1.93 m)  Wt 269 lb (122.018 kg)  BMI 32.76 kg/m2    Wt Readings from Last 3 Encounters:  05/14/15 269 lb (122.018 kg)  04/30/15 270 lb (122.471 kg)  04/02/15 270 lb 12.8 oz (122.834 kg)     GEN: Well nourished, well developed, in no acute distress HEENT: normal Neck: no JVD,   no masses Cardiac:  Normal S1/S2, irreg irreg rhythm; no murmur ,  no rubs or gallops, no edema; right wrist without hematoma or mass  Respiratory:  clear to auscultation  bilaterally, no wheezing, rhonchi or rales. GI: soft, nontender, nondistended, + BS MS: no deformity or atrophy Skin: warm and dry  Neuro:  CNs II-XII intact, Strength and sensation are intact Psych: Normal affect   EKG:  EKG is ordered today.  It demonstrates:   AFib, HR 84, no change from prior tracing.   Recent Labs: 09/04/2014: ALT 39; TSH 1.63 04/24/2015: BUN 21; Creatinine, Ser 1.10; Hemoglobin 15.8; Platelets 155.0; Potassium 4.5; Sodium 140    Lipid Panel    Component Value Date/Time   CHOL 141 09/04/2014 0911   TRIG 64.0 09/04/2014 0911   TRIG 78 09/15/2006 0834   HDL 46.00 09/04/2014 0911   CHOLHDL 3 09/04/2014 0911   CHOLHDL 3.5 CALC 09/15/2006 0834   VLDL 12.8 09/04/2014 0911   LDLCALC 82 09/04/2014 0911      ASSESSMENT AND PLAN:  NICM (nonischemic cardiomyopathy):  Doing well after recent cardiac catheterization. This demonstrated normal coronary arteries. Continue beta blocker and ACE inhibitor. I will titrate his beta blocker further to goal. He will increase Toprol-XL to 125 mg daily for 1 week. If he tolerates this, he will then increase to 150 mg daily. I will review his case further with Dr. Marlou Porch regarding +/- referral to EP for ICD.  Question if we should consider Holter to confirm HR is controlled.  Will discuss this with Dr. Candee Furbish.  Persistent atrial fibrillation:  Rate appears controlled.  Increase Toprol-XL to 150 mg QD as noted.   Hyperlipidemia:  Continue statin.  Snoring:  Refer for split night sleep study.      Medication Changes: Current medicines are reviewed at length with the patient today.  Concerns regarding medicines are as outlined above.  The following changes have been made:   Discontinued Medications   No medications on file   Modified Medications   No medications on file   New Prescriptions   METOPROLOL SUCCINATE (TOPROL-XL) 25 MG 24 HR TABLET    Take 1 tablet (25 mg total) by mouth daily.    Labs/ tests ordered today  include:   Orders Placed This Encounter  Procedures  . EKG 12-Lead  . Split night study     Disposition:   FU with Dr. Candee Furbish 2 month.    Signed, Versie Starks, MHS 05/14/2015 5:16 PM    [facility] [address], [place], [place]  [address] Phone: [phone]; Fax: [phone]

## 2015-05-14 ENCOUNTER — Ambulatory Visit (INDEPENDENT_AMBULATORY_CARE_PROVIDER_SITE_OTHER): Payer: 59 | Admitting: Physician Assistant

## 2015-05-14 ENCOUNTER — Encounter: Payer: Self-pay | Admitting: Physician Assistant

## 2015-05-14 VITALS — BP 118/60 | HR 84 | Ht 76.0 in | Wt 269.0 lb

## 2015-05-14 DIAGNOSIS — I429 Cardiomyopathy, unspecified: Secondary | ICD-10-CM | POA: Diagnosis not present

## 2015-05-14 DIAGNOSIS — E785 Hyperlipidemia, unspecified: Secondary | ICD-10-CM | POA: Diagnosis not present

## 2015-05-14 DIAGNOSIS — I481 Persistent atrial fibrillation: Secondary | ICD-10-CM

## 2015-05-14 DIAGNOSIS — R0683 Snoring: Secondary | ICD-10-CM | POA: Diagnosis not present

## 2015-05-14 DIAGNOSIS — I4819 Other persistent atrial fibrillation: Secondary | ICD-10-CM

## 2015-05-14 DIAGNOSIS — I428 Other cardiomyopathies: Secondary | ICD-10-CM

## 2015-05-14 MED ORDER — METOPROLOL SUCCINATE ER 25 MG PO TB24: 25.0000 mg | ORAL_TABLET | Freq: Every day | ORAL | Status: DC

## 2015-05-14 NOTE — Patient Instructions (Signed)
Medication Instructions:  Increase Toprol-XL to 125 mg Once daily for 1 week.  If you tolerate this, increase Toprol-XL to 150 mg Once daily. A prescription has been sent in for Toprol-XL 25 mg to add to you 100 mg tablet for the increase. Call us when you are running out of the 25 mg tablets and we will change your prescription.  Labwork: None today  Testing/Procedures: We will schedule you for a sleep study.  Follow-Up: Dr. Candee Furbish or Richardson Dopp, PA-C in 2 months.  Any Other Special Instructions Will Be Listed Below (If Applicable).

## 2015-05-26 ENCOUNTER — Telehealth: Payer: Self-pay | Admitting: Physician Assistant

## 2015-05-26 DIAGNOSIS — I4819 Other persistent atrial fibrillation: Secondary | ICD-10-CM

## 2015-05-26 NOTE — Telephone Encounter (Signed)
Please tell patient I reviewed his case with Dr. Candee Furbish.  Please arrange a 24 hour Holter to see how his HR control is with his current medications.  We will decide on whether to refer him to EP after we get the monitor done.  I discussed this with him when I saw him.  We were trying to decide when to see EP to discuss +/- ICD.  I will follow up on this once I get his monitor back.  Richardson Dopp, PA-C   05/26/2015 5:19 PM

## 2015-05-27 NOTE — Telephone Encounter (Signed)
Lmptcb to advise pt per per Dr. Marlou Porch and Brynda Rim. PA to wear a 24 hour holter monitor.

## 2015-05-28 NOTE — Telephone Encounter (Signed)
error 

## 2015-05-28 NOTE — Telephone Encounter (Signed)
ptcb today and has been made aware Brynda Rim. PA and Dr. Marlou Porch ordering 24 hour holter monitor. Pt advised no referral to EP until monitor results have been read.

## 2015-06-03 ENCOUNTER — Other Ambulatory Visit: Payer: Self-pay | Admitting: Physician Assistant

## 2015-06-03 DIAGNOSIS — I4819 Other persistent atrial fibrillation: Secondary | ICD-10-CM

## 2015-06-03 DIAGNOSIS — I428 Other cardiomyopathies: Secondary | ICD-10-CM

## 2015-06-04 ENCOUNTER — Ambulatory Visit (INDEPENDENT_AMBULATORY_CARE_PROVIDER_SITE_OTHER): Payer: 59

## 2015-06-04 DIAGNOSIS — I481 Persistent atrial fibrillation: Secondary | ICD-10-CM

## 2015-06-04 DIAGNOSIS — I4819 Other persistent atrial fibrillation: Secondary | ICD-10-CM

## 2015-06-04 DIAGNOSIS — I429 Cardiomyopathy, unspecified: Secondary | ICD-10-CM

## 2015-06-04 DIAGNOSIS — I428 Other cardiomyopathies: Secondary | ICD-10-CM

## 2015-06-05 ENCOUNTER — Encounter: Payer: Self-pay | Admitting: Physician Assistant

## 2015-06-13 ENCOUNTER — Other Ambulatory Visit: Payer: Self-pay | Admitting: *Deleted

## 2015-06-13 MED ORDER — METOPROLOL SUCCINATE ER 200 MG PO TB24: 200.0000 mg | ORAL_TABLET | Freq: Every day | ORAL | Status: DC

## 2015-07-02 ENCOUNTER — Ambulatory Visit (INDEPENDENT_AMBULATORY_CARE_PROVIDER_SITE_OTHER): Payer: 59 | Admitting: Internal Medicine

## 2015-07-02 ENCOUNTER — Encounter: Payer: Self-pay | Admitting: Internal Medicine

## 2015-07-02 VITALS — BP 98/66 | HR 99 | Ht 76.0 in | Wt 273.4 lb

## 2015-07-02 DIAGNOSIS — I428 Other cardiomyopathies: Secondary | ICD-10-CM

## 2015-07-02 DIAGNOSIS — I4819 Other persistent atrial fibrillation: Secondary | ICD-10-CM

## 2015-07-02 DIAGNOSIS — I481 Persistent atrial fibrillation: Secondary | ICD-10-CM | POA: Diagnosis not present

## 2015-07-02 DIAGNOSIS — I429 Cardiomyopathy, unspecified: Secondary | ICD-10-CM | POA: Diagnosis not present

## 2015-07-02 NOTE — Assessment & Plan Note (Signed)
His ventricular rate is well controlled. He does not have palpitations. He will continue his anti-coagulation. I discussed the treatment options with the patient and after his sleep evaluation, we will consider initiation of Tikosyn in an attempt to maintain NSR. Prior QT intervals were good.

## 2015-07-02 NOTE — Patient Instructions (Signed)
Medication Instructions:  Your physician recommends that you continue on your current medications as directed. Please refer to the Current Medication list given to you today.  Labwork: None ordered  Testing/Procedures: None orderecd  Follow-Up: Your physician recommends that you schedule a follow-up appointment in: 8 weeks with Dr. Lovena Le.    Any Other Special Instructions Will Be Listed Below (If Applicable). Thank you for choosing [facility]!!

## 2015-07-02 NOTE — Assessment & Plan Note (Signed)
His EF is read as 35-45%. I do not think he is a candidate for ICD implant at this time but would recommend monitoring his EF with a repeat echo once we have gotten him back to NSR if possible.

## 2015-07-02 NOTE — Progress Notes (Signed)
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 He is a pleasant middle aged man who was found to have atrial fib and LV dysfunction nearly a year ago. In the interim he has undergone attempted DCCV but he did not maintain NSR. His heart failure is very well compensated and appears to be class 1 on maximal medical therapy. He does not feel palpitations. He has never had syncope.   Allergies  Allergen Reactions  . Eggs Or Egg-Derived Products Anaphylaxis    REACTION: no vaccines     Current Outpatient Prescriptions  Medication Sig Dispense Refill  . apixaban (ELIQUIS) 5 MG TABS tablet Take 5 mg by mouth 2 (two) times daily. Taking one tablet by mouth in the morning and one tablet in the evening    . atorvastatin (LIPITOR) 10 MG tablet Take 1 tablet (10 mg total) by mouth daily. 90 tablet 3  . lisinopril (PRINIVIL,ZESTRIL) 5 MG tablet Take 1 tablet (5 mg total) by mouth daily. 30 tablet 11  . metoprolol succinate (TOPROL-XL) 200 MG 24 hr tablet Take 1 tablet (200 mg total) by mouth daily. Take with or immediately following a meal. 90 tablet 3   No current facility-administered medications for this visit.     Past Medical History  Diagnosis Date  . Hyperlipidemia   . Diverticulosis   . Vitreous anomalies 2014    Tack down of vitreous tear  . Hypertension     ROS:   All systems reviewed and negative except as noted in the HPI.   Past Surgical History  Procedure Laterality Date  . Colonoscopy    . Eye surgery  2014  . Cardioversion N/A 11/20/2014    Procedure: CARDIOVERSION;  Surgeon: Pixie Casino, MD;  Location: [facility] ENDOSCOPY;  Service: Cardiovascular;  Laterality: N/A;  11:38 Etomidate given IV for unsuccessful synched cardioversion @ 150 joules from Afib, 11:39 repeated synched cardioversion @ 200 joules successfully from Afib to SR. 12 lead EKG ordered to confirm..  . Cardiac catheterization  N/A 04/30/2015    Procedure: Left Heart Cath and Coronary Angiography;  Surgeon: Jerline Pain, MD;  Location: [facility] CV LAB;  Service: Cardiovascular;  Laterality: N/A;     Family History  Problem Relation Age of Onset  . Asthma Mother   . Brain cancer Father     not genetic  . Alzheimer's disease Mother     diagnosed 32  . Other Brother     gilbert disease  . Heart attack Maternal Grandmother   . Heart attack Maternal Uncle      Social History   Social History  . Marital Status: Married    Spouse Name: N/A  . Number of Children: N/A  . Years of Education: N/A   Occupational History  . Not on file.   Social History Main Topics  . Smoking status: Never Smoker   . Smokeless tobacco: Never Used  . Alcohol Use: 0.0 oz/week    0 Standard drinks or equivalent per week     Comment: 1 drink once a month max  . Drug Use: No  . Sexual Activity: Yes   Other Topics Concern  . Not on file   Social History Narrative   Married 1977. 2 children (son in doctorate program at [employer] and daughter with marketing group in [place]). 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 3 dogs, 1 cat, 1 snake.      Hobbies: fishing on [place] in [place], family time, nature, hiking     BP 98/66 mmHg  Pulse 99  Ht 6\' 4"  (1.93 m)  Wt 273 lb 6.4 oz (124.013 kg)  BMI 33.29 kg/m2  Physical Exam:  Well appearing middle aged man, NAD HEENT: Unremarkable Neck:  7 cm JVD, no thyromegally Lymphatics:  No adenopathy Back:  No CVA tenderness Lungs:  Clear with no wheezes, rales, or rhonchi HEART:  IRegular rate rhythm, no murmurs, no rubs, no clicks Abd:  soft, positive bowel sounds, no organomegally, no rebound, no guarding Ext:  2 plus pulses, no edema, no cyanosis, no clubbing Skin:  No rashes no nodules Neuro:  CN II through XII intact, motor grossly intact  EKG  - atrial fib with a controlled   Assess/Plan:

## 2015-07-15 NOTE — Progress Notes (Addendum)
Cardiology Office Note   Date:  07/16/2015   ID:  [name] Aguilar Jaelen, Soth Dec 25, 1955, MRN [id number]  PCP:  Garret Reddish, MD  Cardiologist:  Dr. Candee Furbish   Electrophysiologist:  Dr. Cristopher Peru    Chief Complaint  Patient presents with  . Atrial Fibrillation  . 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  Patient had poor rate control when he was initially diagnosed with atrial fibrillation. With improved rate control, his ejection fraction remained 30-35%. Therefore, cardiac catheterization was arranged. This was performed 04/30/15. This demonstrated normal coronary arteries with an EF of 35-45%.   I saw him in follow-up 7/16. Beta blocker dose was increased further. Follow-up Holter demonstrated uncontrolled heart rate. Rate control therapy continued to be titrated. He was ultimately seen by Dr. Lovena Le 9/14. ICD was not felt to be indicated at that time. The patient has a sleep study pending. Recommendation is to attempt Tikosyn initiation after his sleep evaluation.  He returns for follow-up.  He is doing well.  The patient denies chest pain, shortness of breath, syncope, orthopnea, PND or significant pedal edema.    Studies/Reports Reviewed Today:  24 hour Holter 8/16   Atrial fibrillation - Mean: 117 bpm   Occasional PVC's   Longest pause 2.1 seconds at 4am during sleep  LHC 04/30/15 LM: Normal LAD: Normal RI: Normal LCx: Normal RCA: Normal EF 35-45% Nonischemic cardiomyopathy  Echo 03/05/15 Mild LVH, EF 30-35%, diffuse HK Mild MR Moderate LAE Mild RVE with mildly reduced RVSF Mild RAE   Past Medical History  Diagnosis Date  . Hyperlipidemia   . Diverticulosis   . Vitreous anomalies 2014    Tack down of vitreous tear  . Hypertension     Past Surgical History  Procedure Laterality Date  . Colonoscopy    . Eye  surgery  2014  . Cardioversion N/A 11/20/2014    Procedure: CARDIOVERSION;  Surgeon: Pixie Casino, MD;  Location: [facility] ENDOSCOPY;  Service: Cardiovascular;  Laterality: N/A;  11:38 Etomidate given IV for unsuccessful synched cardioversion @ 150 joules from Afib, 11:39 repeated synched cardioversion @ 200 joules successfully from Afib to SR. 12 lead EKG ordered to confirm..  . Cardiac catheterization N/A 04/30/2015    Procedure: Left Heart Cath and Coronary Angiography;  Surgeon: Jerline Pain, MD;  Location: [facility];  Service: Cardiovascular;  Laterality: N/A;     Current Outpatient Prescriptions  Medication Sig Dispense Refill  . apixaban (ELIQUIS) 5 MG TABS tablet Take 5 mg by mouth 2 (two) times daily. Taking one tablet by mouth in the morning and one tablet in the evening    . atorvastatin (LIPITOR) 10 MG tablet Take 1 tablet (10 mg total) by mouth daily. 90 tablet 3  . lisinopril (PRINIVIL,ZESTRIL) 5 MG tablet Take 1 tablet (5 mg total) by mouth daily. 30 tablet 11  . metoprolol succinate (TOPROL-XL) 200 MG 24 hr tablet Take 1 tablet (200 mg total) by mouth daily. Take with or immediately following a meal. 90 tablet 3   No current facility-administered medications for this visit.    Allergies:   Eggs or egg-derived products    Social History:  The patient  reports that he has never smoked. He has never used smokeless tobacco. He reports that he drinks alcohol. He reports that he does not use illicit  drugs.  Social History   Social History  . Marital Status: Married    Spouse Name: N/A  . Number of Children: N/A  . Years of Education: N/A   Social History Main Topics  . Smoking status: Never Smoker   . Smokeless tobacco: Never Used  . Alcohol Use: 0.0 oz/week    0 Standard drinks or equivalent per week     Comment: 1 drink once a month max  . Drug Use: No  . Sexual Activity: Yes   Other Topics Concern  . None   Social History Narrative   Married 1977. 2  children (son in doctorate program at [employer] and daughter with marketing group in [place]). No grandkids   [employer] [place]   [employer] for [employer].       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 3 dogs, 1 cat, 1 snake.      Hobbies: fishing on reservoirs in [place], family time, nature, hiking    Family History:  The patient's family history includes Alzheimer's disease in his mother; Asthma in his mother; Brain cancer in his father; Heart attack in his maternal grandmother and maternal uncle; Other in his brother.    ROS:   Please see the history of present illness.   Review of Systems  Respiratory: Positive for snoring.   Musculoskeletal: Positive for back pain and myalgias.  Gastrointestinal: Negative for hematochezia and melena.  All other systems reviewed and are negative.    PHYSICAL EXAM: VS:  BP 112/70 mmHg  Pulse 63  Ht 6\' 4"  (1.93 m)  Wt 271 lb 12.8 oz (123.288 kg)  BMI 33.10 kg/m2    Wt Readings from Last 3 Encounters:  07/16/15 271 lb 12.8 oz (123.288 kg)  07/02/15 273 lb 6.4 oz (124.013 kg)  05/14/15 269 lb (122.018 kg)     GEN: Well nourished, well developed, in no acute distress HEENT: normal Neck: no JVD,   no masses Cardiac:  Normal S1/S2, irreg irreg rhythm; no murmur ,  no rubs or gallops, no edema; right wrist without hematoma or mass  Respiratory:  clear to auscultation bilaterally, no wheezing, rhonchi or rales. GI: soft, nontender, nondistended, + BS MS: no deformity or atrophy Skin: warm and dry  Neuro:  CNs II-XII intact, Strength and sensation are intact Psych: Normal affect   EKG:  EKG is not ordered today.  It demonstrates:   n/a   Recent Labs: 09/04/2014: ALT 39; TSH 1.63 04/24/2015: BUN 21; Creatinine, Ser 1.10; Hemoglobin 15.8; Platelets 155.0; Potassium 4.5; Sodium 140    Lipid Panel    Component Value Date/Time   CHOL 141 09/04/2014 0911   TRIG 64.0 09/04/2014 0911    TRIG 78 09/15/2006 0834   HDL 46.00 09/04/2014 0911   CHOLHDL 3 09/04/2014 0911   CHOLHDL 3.5 CALC 09/15/2006 0834   VLDL 12.8 09/04/2014 0911   LDLCALC 82 09/04/2014 0911      ASSESSMENT AND PLAN:  1. NICM (nonischemic cardiomyopathy):  LHC in 7/16 demonstrated normal coronary arteries. Continue beta blocker and ACE inhibitor. FU with Dr. Cristopher Peru as planned.  He will need a FU echo once NSR restored.    2. Persistent atrial fibrillation:   Continue Eliquis.  FU with Dr. Cristopher Peru as planned.  Tikosyn to be considered after sleep test. HR fairly well controlled.  HR 95 by my count. Continue current dose of Toprol.    3.  Hyperlipidemia:  Continue statin.  4. Snoring:  Split night sleep study pending.       Medication Changes: Current medicines are reviewed at length with the patient today.  Concerns regarding medicines are as outlined above.  The following changes have been made:   Discontinued Medications   No medications on file   Modified Medications   No medications on file   New Prescriptions   No medications on file    Labs/ tests ordered today include:   No orders of the defined types were placed in this encounter.     Disposition:    FU with Dr. Candee Furbish 6 mos. Dr. Cristopher Peru as planned.     Signed, Versie Starks, MHS 07/16/2015 9:00 AM    [address], [place], [place]  [address] Phone: [phone]; Fax: [phone]

## 2015-07-16 ENCOUNTER — Encounter: Payer: Self-pay | Admitting: Physician Assistant

## 2015-07-16 ENCOUNTER — Ambulatory Visit (INDEPENDENT_AMBULATORY_CARE_PROVIDER_SITE_OTHER): Payer: 59 | Admitting: Physician Assistant

## 2015-07-16 VITALS — BP 112/70 | HR 63 | Ht 76.0 in | Wt 271.8 lb

## 2015-07-16 DIAGNOSIS — I429 Cardiomyopathy, unspecified: Secondary | ICD-10-CM | POA: Diagnosis not present

## 2015-07-16 DIAGNOSIS — R0683 Snoring: Secondary | ICD-10-CM

## 2015-07-16 DIAGNOSIS — I481 Persistent atrial fibrillation: Secondary | ICD-10-CM

## 2015-07-16 DIAGNOSIS — I4819 Other persistent atrial fibrillation: Secondary | ICD-10-CM

## 2015-07-16 DIAGNOSIS — I428 Other cardiomyopathies: Secondary | ICD-10-CM

## 2015-07-16 DIAGNOSIS — E785 Hyperlipidemia, unspecified: Secondary | ICD-10-CM | POA: Diagnosis not present

## 2015-07-16 NOTE — Patient Instructions (Signed)
Medication Instructions:  Your physician recommends that you continue on your current medications as directed. Please refer to the Current Medication list given to you today.   Labwork: NONE  Testing/Procedures: NONE  Follow-Up: DR. Marlou Porch 6 MONTHS WE WILL SEND OUT A REMINDER A COUPLE EARLIER TO CALL AND MAKE APPT  KEEP YOUR UPCOMING APPT WITH DR. Lovena Le  Any Other Special Instructions Will Be Listed Below (If Applicable).

## 2015-08-01 ENCOUNTER — Ambulatory Visit (HOSPITAL_BASED_OUTPATIENT_CLINIC_OR_DEPARTMENT_OTHER): Payer: 59 | Attending: Physician Assistant | Admitting: *Deleted

## 2015-08-01 VITALS — Ht 76.0 in | Wt 270.0 lb

## 2015-08-01 DIAGNOSIS — G4733 Obstructive sleep apnea (adult) (pediatric): Secondary | ICD-10-CM

## 2015-08-01 DIAGNOSIS — I493 Ventricular premature depolarization: Secondary | ICD-10-CM | POA: Insufficient documentation

## 2015-08-01 DIAGNOSIS — I4891 Unspecified atrial fibrillation: Secondary | ICD-10-CM | POA: Diagnosis not present

## 2015-08-01 DIAGNOSIS — I4819 Other persistent atrial fibrillation: Secondary | ICD-10-CM

## 2015-08-01 DIAGNOSIS — R0683 Snoring: Secondary | ICD-10-CM | POA: Diagnosis not present

## 2015-08-04 ENCOUNTER — Telehealth: Payer: Self-pay | Admitting: Cardiology

## 2015-08-04 NOTE — Sleep Study (Signed)
   Patient Name: [name] Aguilar, [name] Aguilar MRN: [id number] Study Date: 08/01/2015 Gender: Male D.O.B: June 03, 1956 Age (years): 64 Referring Provider: Richardson Dopp Interpreting Physician: Fransico Him MD, ABSM RPSGT: Neeriemer, Holly Height (inches): 76 BMI: 33 Weight (lbs): 270 Neck Size: 17.00  CLINICAL INFORMATION Sleep Study Type: NPSG Indication for sleep study: Snoring Epworth Sleepiness Score: 10  SLEEP STUDY TECHNIQUE As per the AASM Manual for the Scoring of Sleep and Associated Events v2.3 (April 2016) with a hypopnea requiring 4% desaturations. The channels recorded and monitored were frontal, central and occipital EEG, electrooculogram (EOG), submentalis EMG (chin), nasal and oral airflow, thoracic and abdominal wall motion, anterior tibialis EMG, snore microphone, electrocardiogram, and pulse oximetry.  MEDICATIONS Patient's medications include: Apixaban, Atorvastatin, Metoprolol, Lisinopril. Medications self-administered by patient during sleep study : No sleep medicine administered.  SLEEP ARCHITECTURE The study was initiated at 11:11:13 PM and ended at 5:20:29 AM. Sleep onset time was 34.3 minutes and the sleep efficiency was reduced at 41.4%. The total sleep time was 153.0 minutes. Stage REM latency was prolonged at 231.5 minutes. The patient spent 17.65% of the night in stage N1 sleep, 72.88% in stage N2 sleep, 0.00% in stage N3 and 9.48% in REM. Alpha intrusion was absent. Supine sleep was 0.00%.  RESPIRATORY PARAMETERS The overall apnea/hypopnea index (AHI) was 3.1 per hour. There were 0 total apneas, including 0 obstructive, 0 central and 0 mixed apneas. There were 8 hypopneas and 14 RERAs. The AHI during Stage REM sleep was 8.3 per hour. AHI while supine was N/A per hour. The mean oxygen saturation was 91.86%. The minimum SpO2 during sleep was 86.00%. Moderate snoring was noted during this study.  CARDIAC DATA The 2 lead EKG demonstrated atrial fibrillation. The  mean heart rate was 85.82 beats per minute. Other EKG findings include: PVCs.  LEG MOVEMENT DATA The total PLMS were 0 with a resulting PLMS index of 0.00. Associated arousal with leg movement index was 0.0 .  IMPRESSIONS - No significant obstructive sleep apnea occurred during this study (AHI = 3.1/h). - No significant central sleep apnea occurred during this study (CAI = 0.0/h). - Mild oxygen desaturation was noted during this study (Min O2 = 86.00%). - The patient snored with Moderate snoring volume. - EKG findings include PVCs and atrial fibrillation. - Clinically significant periodic limb movements did not occur during sleep. No significant associated arousals.  DIAGNOSIS - snoring - Atrial fibrillation  RECOMMENDATIONS - Recommend repeat sleep study with sleep aide as patient had very little sleep time with only about 2 hours of total sleep time.   - Avoid alcohol, sedatives and other CNS depressants that may worsen sleep apnea and disrupt normal sleep architecture. - Sleep hygiene should be reviewed to assess factors that may improve sleep quality. - Weight management and regular exercise should be initiated or continued if appropriate.   Sueanne Margarita Diplomate, American Board of Sleep Medicine  ELECTRONICALLY SIGNED ON:  08/04/2015, 8:51 PM [place] PH: [phone]   FX: [phone] [place]

## 2015-08-04 NOTE — Telephone Encounter (Signed)
Please let patient know that he did not have enough sleep time to adequately evaluate for sleep apnea.  Please repeat study with a sleep aide.  Lunesta 2mg  (#1 tablet with no refills) to take on arrival at sleep lab.

## 2015-08-05 NOTE — Telephone Encounter (Signed)
Left message for patient to call back  

## 2015-08-06 ENCOUNTER — Telehealth: Payer: Self-pay

## 2015-08-06 ENCOUNTER — Other Ambulatory Visit: Payer: Self-pay

## 2015-08-06 DIAGNOSIS — R0683 Snoring: Secondary | ICD-10-CM

## 2015-08-06 MED ORDER — ESZOPICLONE 2 MG PO TABS: 2.0000 mg | ORAL_TABLET | ORAL | Status: DC

## 2015-08-06 NOTE — Telephone Encounter (Signed)
Left message for patient to call me back. I put in order for sleep study and [facility] scheduled it for Sunday November 13th 2016 at 8pm. I need to verify patients pharmacy so I can call in Lunesta 2MG   1 tablet 0 refills  to be taken when he arrives at sleep center. Patient called back as I was finishing note. He stated his understanding of appointment date and time and confirmed he would be at sleep center for appt. I called in lunesta 2mg  for patient to his pharmacy and patient stated his understanding that he would not take this until arrival to sleep center on November 13th.    Sueanne Margarita, MD at 08/04/2015 9:12 PM     Status: Signed       Expand All Collapse All   Please let patient know that he did not have enough sleep time to adequately evaluate for sleep apnea. Please repeat study with a sleep aide. Lunesta 2mg  (#1 tablet with no refills) to take on arrival at sleep lab.

## 2015-08-31 ENCOUNTER — Ambulatory Visit (HOSPITAL_BASED_OUTPATIENT_CLINIC_OR_DEPARTMENT_OTHER): Payer: 59 | Attending: Cardiology | Admitting: *Deleted

## 2015-08-31 VITALS — Ht 76.0 in | Wt 270.0 lb

## 2015-08-31 DIAGNOSIS — I493 Ventricular premature depolarization: Secondary | ICD-10-CM | POA: Diagnosis not present

## 2015-08-31 DIAGNOSIS — R0683 Snoring: Secondary | ICD-10-CM

## 2015-08-31 DIAGNOSIS — G4733 Obstructive sleep apnea (adult) (pediatric): Secondary | ICD-10-CM

## 2015-09-01 ENCOUNTER — Telehealth: Payer: Self-pay | Admitting: Cardiology

## 2015-09-01 NOTE — Sleep Study (Signed)
   Patient Name: [name] Aguilar, [name] Aguilar MRN: [id number] Study Date: 08/31/2015 Gender: Male D.O.B: 1956-08-28 Age (years): 72 Referring Provider: Fransico Him MD, ABSM Interpreting Physician: Fransico Him MD, ABSM RPSGT: Neeriemer, Holly  Weight (lbs): 270 Height (inches): 76 BMI: 33 Neck Size: 17.00  CLINICAL INFORMATION Sleep Study Type: NPSG Indication for sleep study: Snoring Epworth Sleepiness Score: Most recent polysomnogram dated 08/01/2015 revealed an AHI of 3.1/h and RDI of 8.6/h.  SLEEP STUDY TECHNIQUE As per the AASM Manual for the Scoring of Sleep and Associated Events v2.3 (April 2016) with a hypopnea requiring 4% desaturations. The channels recorded and monitored were frontal, central and occipital EEG, electrooculogram (EOG), submentalis EMG (chin), nasal and oral airflow, thoracic and abdominal wall motion, anterior tibialis EMG, snore microphone, electrocardiogram, and pulse oximetry.  MEDICATIONS Patient's medications include: Apixaban, Atorvastatin, Lunesta, Lisinopril and Toprol. Medications self-administered by patient during sleep study : No sleep medicine administered.  SLEEP ARCHITECTURE The study was initiated at 10:41:59 PM and ended at 4:46:59 AM. Sleep onset time was 12.4 minutes and the sleep efficiency was reduced at 68.6%. The total sleep time was 250.5 minutes. Stage REM latency was 84.5 minutes. The patient spent 14.97% of the night in stage N1 sleep, 78.24% in stage N2 sleep, 0.00% in stage N3 and 6.79% in REM. Alpha intrusion was absent. Supine sleep was 15.15%.  RESPIRATORY PARAMETERS The overall apnea/hypopnea index (AHI) was 0.5 per hour. There were 0 total apneas, including 0 obstructive, 0 central and 0 mixed apneas. There were 2 hypopneas and 9 RERAs. The AHI during Stage REM sleep was 7.1 per hour. AHI while supine was 1.6 per hour. The mean oxygen saturation was 94.07%. The minimum SpO2 during sleep was 87.00%. Soft snoring was noted  during this study.  CARDIAC DATA The 2 lead EKG demonstrated atrial fibrillation. The mean heart rate was N/A beats per minute. Other EKG findings include: PVCs.  LEG MOVEMENT DATA The total PLMS were 1 with a resulting PLMS index of 0.24. Associated arousal with leg movement index was 0.0 .  IMPRESSIONS - No significant obstructive sleep apnea occurred during this study (AHI = 0.5/h). - No significant central sleep apnea occurred during this study (CAI = 0.0/h). - Mild oxygen desaturation was noted during this study (Min O2 = 87.00%). - The patient snored with Soft snoring volume. - EKG findings include PVCs. - Clinically significant periodic limb movements did not occur during sleep. No significant associated arousals.  DIAGNOSIS - Snoring  RECOMMENDATIONS - Avoid alcohol, sedatives and other CNS depressants that may results in sleep apnea and disrupt normal sleep architecture. - Sleep hygiene should be reviewed to assess factors that may improve sleep quality. - Weight management and regular exercise should be initiated or continued if appropriate.   [employer], American Board of Sleep Medicine  ELECTRONICALLY SIGNED ON:  09/01/2015, 10:23 AM [place] PH: [phone]   FX: [phone] [place]

## 2015-09-01 NOTE — Telephone Encounter (Signed)
Please let patient know that sleep study showed no significant sleep apnea.  He did have some snoring so I could refer him to ENT if he would like.

## 2015-09-02 NOTE — Telephone Encounter (Signed)
Patient is informed of results. Stated verbal understanding - ENT referral denied at this time but he will call if he decides to go that route.

## 2015-09-02 NOTE — Telephone Encounter (Signed)
Patient asked that I be sure to send results to Dr. Marlou Porch so that he is aware.  I have routed this note to him.

## 2015-09-02 NOTE — Telephone Encounter (Signed)
Left message for patient to call back  

## 2015-09-10 ENCOUNTER — Encounter: Payer: Self-pay | Admitting: Internal Medicine

## 2015-09-10 ENCOUNTER — Ambulatory Visit (INDEPENDENT_AMBULATORY_CARE_PROVIDER_SITE_OTHER): Payer: 59 | Admitting: Internal Medicine

## 2015-09-10 VITALS — BP 100/78 | HR 107 | Ht 76.0 in | Wt 274.2 lb

## 2015-09-10 DIAGNOSIS — I481 Persistent atrial fibrillation: Secondary | ICD-10-CM

## 2015-09-10 DIAGNOSIS — I429 Cardiomyopathy, unspecified: Secondary | ICD-10-CM

## 2015-09-10 DIAGNOSIS — I428 Other cardiomyopathies: Secondary | ICD-10-CM

## 2015-09-10 DIAGNOSIS — I4819 Other persistent atrial fibrillation: Secondary | ICD-10-CM

## 2015-09-10 MED ORDER — DIGOXIN 250 MCG PO TABS: 0.2500 mg | ORAL_TABLET | Freq: Every day | ORAL | Status: DC

## 2015-09-10 MED ORDER — ATORVASTATIN CALCIUM 10 MG PO TABS: 10.0000 mg | ORAL_TABLET | Freq: Every day | ORAL | Status: DC

## 2015-09-10 NOTE — Assessment & Plan Note (Signed)
He will continue systemic anticoagulation. 

## 2015-09-10 NOTE — Assessment & Plan Note (Signed)
We spent considerable time discussing the treatment options with the patient. He does not feel his atrial fib. On toprol 150 daily his ave 24 hour HR was 117. He has been on 200 mg daily since. His HR is still not controlled. Despite this, he is able to work regularly and has class 1 symptoms. I have recommended we add digoxin to his regimen. I considered a calcium blocker but his blood pressure is low. Will have him return to get an ECG and a digoxin level when he returns for followup. If his HR is poorly controlled, we would consider trying rhythm control. If his rate is slow, would consider PPM.

## 2015-09-10 NOTE — Patient Instructions (Addendum)
Medication Instructions:  Your physician has recommended you make the following change in your medication:  1) Start Digoxin 0.25mg  daily    Labwork: Your physician recommends that you return for lab work in: 2 weeks: Dig level   Testing/Procedures: None ordered    Follow-Up:  Your physician recommends that you schedule a follow-up appointment in: 2 weeks in nurse room for an EKG and will have labs same day  Your physician wants you to follow-up in: 6 months with Dr Knox Saliva will receive a reminder letter in the mail two months in advance. If you don't receive a letter, please call our office to schedule the follow-up appointment.   Any Other Special Instructions Will Be Listed Below (If Applicable).     If you need a refill on your cardiac medications before your next appointment, please call your pharmacy.

## 2015-09-10 NOTE — Progress Notes (Signed)
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 He is a pleasant middle aged man who was found to have atrial fib and LV dysfunction nearly a year ago. In the interim he has undergone attempted DCCV but he did not maintain NSR. His heart failure is very well compensated and appears to be class 1 on maximal medical therapy. He does not feel palpitations. He has never had syncope.   Allergies  Allergen Reactions  . Eggs Or Egg-Derived Products Anaphylaxis    REACTION: no vaccines     Current Outpatient Prescriptions  Medication Sig Dispense Refill  . apixaban (ELIQUIS) 5 MG TABS tablet Take 5 mg by mouth 2 (two) times daily. Taking one tablet by mouth in the morning and one tablet in the evening    . atorvastatin (LIPITOR) 10 MG tablet Take 1 tablet (10 mg total) by mouth daily. 90 tablet 3  . lisinopril (PRINIVIL,ZESTRIL) 5 MG tablet Take 1 tablet (5 mg total) by mouth daily. 30 tablet 11  . metoprolol succinate (TOPROL-XL) 200 MG 24 hr tablet Take 1 tablet (200 mg total) by mouth daily. Take with or immediately following a meal. 90 tablet 3   No current facility-administered medications for this visit.     Past Medical History  Diagnosis Date  . Hyperlipidemia   . Diverticulosis   . Vitreous anomalies 2014    Tack down of vitreous tear  . Hypertension     ROS:   All systems reviewed and negative except as noted in the HPI.   Past Surgical History  Procedure Laterality Date  . Colonoscopy    . Eye surgery  2014  . Cardioversion N/A 11/20/2014    Procedure: CARDIOVERSION;  Surgeon: Pixie Casino, MD;  Location: [facility] ENDOSCOPY;  Service: Cardiovascular;  Laterality: N/A;  11:38 Etomidate given IV for unsuccessful synched cardioversion @ 150 joules from Afib, 11:39 repeated synched cardioversion @ 200 joules successfully from Afib to SR. 12 lead EKG ordered to confirm..  . Cardiac catheterization  N/A 04/30/2015    Procedure: Left Heart Cath and Coronary Angiography;  Surgeon: Jerline Pain, MD;  Location: [place] [facility];  Service: Cardiovascular;  Laterality: N/A;     Family History  Problem Relation Age of Onset  . Asthma Mother   . Brain cancer Father     not genetic  . Alzheimer's disease Mother     diagnosed 32  . Other Brother     gilbert disease  . Heart attack Maternal Grandmother   . Heart attack Maternal Uncle      Social History   Social History  . Marital Status: Married    Spouse Name: N/A  . Number of Children: N/A  . Years of Education: N/A   Occupational History  . Not on file.   Social History Main Topics  . Smoking status: Never Smoker   . Smokeless tobacco: Never Used  . Alcohol Use: 0.0 oz/week    0 Standard drinks or equivalent per week     Comment: 1 drink once a month max  . Drug Use: No  . Sexual Activity: Yes   Other Topics Concern  . Not on file   Social History Narrative   Married 1977. 2 children (son in doctorate program at [employer] and daughter with marketing group in [place]). 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 3 dogs, 1 cat, 1 snake.      Hobbies: fishing on reservoirs in [place], family time, nature, hiking     BP 100/78 mmHg  Pulse 107  Ht 6\' 4"  (1.93 m)  Wt 274 lb 3.2 oz (124.376 kg)  BMI 33.39 kg/m2  Physical Exam:  Well appearing middle aged man, NAD HEENT: Unremarkable Neck:  7 cm JVD, no thyromegally Lymphatics:  No adenopathy Back:  No CVA tenderness Lungs:  Clear with no wheezes, rales, or rhonchi HEART:  IRegular rate rhythm, no murmurs, no rubs, no clicks Abd:  soft, positive bowel sounds, no organomegally, no rebound, no guarding Ext:  2 plus pulses, no edema, no cyanosis, no clubbing Skin:  No rashes no nodules Neuro:  CN II through XII intact, motor grossly  intact  EKG - atrial fib with a controlled   Assess/Plan:

## 2015-09-24 ENCOUNTER — Ambulatory Visit (INDEPENDENT_AMBULATORY_CARE_PROVIDER_SITE_OTHER): Payer: 59

## 2015-09-24 ENCOUNTER — Other Ambulatory Visit (INDEPENDENT_AMBULATORY_CARE_PROVIDER_SITE_OTHER): Payer: 59 | Admitting: *Deleted

## 2015-09-24 ENCOUNTER — Other Ambulatory Visit: Payer: Self-pay | Admitting: Cardiology

## 2015-09-24 DIAGNOSIS — I481 Persistent atrial fibrillation: Secondary | ICD-10-CM | POA: Diagnosis not present

## 2015-09-24 DIAGNOSIS — I4819 Other persistent atrial fibrillation: Secondary | ICD-10-CM

## 2015-09-24 LAB — DIGOXIN LEVEL: DIGOXIN LVL: 0.9 ug/L (ref 0.8–2.0)

## 2015-09-24 NOTE — Addendum Note (Signed)
Addended by: Eulis Foster on: 09/24/2015 08:31 AM   Modules accepted: Orders

## 2015-09-24 NOTE — Progress Notes (Signed)
**Note De-Identified Ivey Nembhard Obfuscation** 1.) Reason for visit: F/u EKG  2.) Name of MD requesting visit: Dr Lovena Le  3.) H&P: Atrial Fibrillation-was advised at last OV with Dr Lovena Le on 09/10/15 to start taking Digoxin 0.25 mg    4.) Assessment and plan per MD: Pt has no complaints at this time. Ekg obtained and given to Dr Lovena Le for his review. Per Dr Lovena Le EKG is much improved and the pt has been advised to continue current medical therapy, he verbalized understanding. The pt is having a Dig level drawn while in office this am.

## 2015-10-08 ENCOUNTER — Encounter: Payer: Self-pay | Admitting: Internal Medicine

## 2015-10-08 ENCOUNTER — Other Ambulatory Visit: Payer: Self-pay | Admitting: Cardiology

## 2015-10-21 MED FILL — METOPROLOL SUCC ER 200 MG T: 200 | 90 days supply | Qty: 90 | Fill #1

## 2015-10-21 MED FILL — ELIQUIS 5 MG TABLET: 5 | 30 days supply | Qty: 60 | Fill #0

## 2015-10-29 ENCOUNTER — Encounter: Payer: Self-pay | Admitting: Physician Assistant

## 2015-10-29 ENCOUNTER — Telehealth: Payer: Self-pay | Admitting: *Deleted

## 2015-10-29 ENCOUNTER — Ambulatory Visit (INDEPENDENT_AMBULATORY_CARE_PROVIDER_SITE_OTHER): Payer: 59 | Admitting: Physician Assistant

## 2015-10-29 VITALS — BP 108/70 | HR 76 | Ht 76.0 in | Wt 281.0 lb

## 2015-10-29 DIAGNOSIS — Z7901 Long term (current) use of anticoagulants: Secondary | ICD-10-CM

## 2015-10-29 DIAGNOSIS — Z1211 Encounter for screening for malignant neoplasm of colon: Secondary | ICD-10-CM | POA: Diagnosis not present

## 2015-10-29 MED ORDER — NA SULFATE-K SULFATE-MG SULF 17.5-3.13-1.6 GM/177ML PO SOLN: 1.0000 | Freq: Once | ORAL | Status: AC

## 2015-10-29 NOTE — Patient Instructions (Signed)
You have been scheduled for a colonoscopy. Please follow written instructions given to you at your visit today.  Please pick up your prep supplies at the pharmacy within the next 1-3 days. [place].  If you use inhalers (even only as needed), please bring them with you on the day of your procedure. Your physician has requested that you go to www.startemmi.com and enter the access code given to you at your visit today. This web site gives a general overview about your procedure. However, you should still follow specific instructions given to you by our office regarding your preparation for the procedure.

## 2015-10-29 NOTE — Telephone Encounter (Signed)
  10/29/2015   RE: Ignac Case Bouch DOB: [birth year] MRN: [id number]   Dear  Dr. Candee Furbish,    We have scheduled the above patient for an endoscopic procedure. Our records show that he is on anticoagulation therapy.   Please advise as to how long the patient may come off his therapy of Eliquis 2 days  prior to the procedure, which is scheduled for  12-24-2015.  Please fax back/ or route the completed form to [place] at [phone].   Sincerely,    Amy Esterwood PA-C

## 2015-10-29 NOTE — Progress Notes (Signed)
Patient ID: [name] Aguilar, male   DOB: Dec 26, 1955, 60 y.o.   MRN: [id number]   Subjective:    Patient ID: [name] Aguilar, male    DOB: [birth year], 60 y.o.   MRN: [id number]  HPI  [name] Aguilar is a pleasant 60 year old white male known remotely to Dr. Carlean Purl who comes in today to discuss recall colonoscopy. He had colonoscopy done in December 2000 59 did have moderately severe diverticulosis and otherwise negative exam. Patient currently has no GI symptoms, no complaints of abdominal pain and constipation changes in bowel habits melena or hematochezia. His family history is negative for colon cancer. Patient does have history of sleep apnea, atrial fibrillation and nonischemic cardiomyopathy with EF of 30-35%. He is followed by Dr. Marlou Porch and is being maintained on Eliquis.  Review of Systems Pertinent positive and negative review of systems were noted in the above HPI section.  All other review of systems was otherwise negative.  Outpatient Encounter Prescriptions as of 10/29/2015  Medication Sig  . atorvastatin (LIPITOR) 10 MG tablet Take 1 tablet (10 mg total) by mouth daily.  . digoxin (LANOXIN) 0.25 MG tablet Take 1 tablet (0.25 mg total) by mouth daily.  Marland Kitchen ELIQUIS 5 MG TABS tablet TAKE 1 TABLET BY MOUTH TWICE DAILY  . lisinopril (PRINIVIL,ZESTRIL) 5 MG tablet TAKE 1 TABLET BY MOUTH ONCE DAILY  . metoprolol succinate (TOPROL-XL) 200 MG 24 hr tablet Take 1 tablet (200 mg total) by mouth daily. Take with or immediately following a meal.  . Na Sulfate-K Sulfate-Mg Sulf SOLN Take 1 kit by mouth once.  . [DISCONTINUED] apixaban (ELIQUIS) 5 MG TABS tablet Take 5 mg by mouth 2 (two) times daily. Taking one tablet by mouth in the morning and one tablet in the evening   No facility-administered encounter medications on file as of 10/29/2015.   Allergies  Allergen Reactions  . Eggs Or Egg-Derived Products Anaphylaxis    REACTION: no vaccines   Patient Active Problem List   Diagnosis Date Noted  .  NICM (nonischemic cardiomyopathy) ([place]) 04/02/2015  . Persistent atrial fibrillation ([place])   . Atrial fibrillation ([place]) 09/11/2014  . Obesity (BMI 30-39.9) 09/19/2013  . H/O calcium pyrophosphate deposition disease (CPPD) 10/16/2009  . Hyperlipidemia 06/23/2007   Social History   Social History  . Marital Status: Married    Spouse Name: N/A  . Number of Children: N/A  . Years of Education: N/A   Occupational History  . Not on file.   Social History Main Topics  . Smoking status: Never Smoker   . Smokeless tobacco: Never Used  . Alcohol Use: 0.0 oz/week    0 Standard drinks or equivalent per week     Comment: 1 drink once a month max  . Drug Use: No  . Sexual Activity: Yes   Other Topics Concern  . Not on file   Social History Narrative   Married 1977. 2 children (son in doctorate program at [employer] and daughter with marketing group in [place]). No grandkids   [employer] [place]   [employer] for [employer].       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 3 dogs, 1 cat, 1 snake.      Hobbies: fishing on reservoirs in [place], family time, nature, hiking    [name] family history includes Alzheimer's disease in his mother; Asthma in his mother; Brain cancer in his father; Heart attack in his maternal grandmother  and maternal uncle; Other in his brother. There is no history of Colon cancer, Esophageal cancer, Pancreatic cancer, or Liver disease.      Objective:    Filed Vitals:   10/29/15 0849  BP: 108/70  Pulse: 76    Physical Exam  well-developed white male in no acute distress, quite pleasant blood pressure 108/70 pulse 76 height 6 foot 4 weight 281. HEENT: nontraumatic, cephalic EOMI PERRLA sclera anicteric,neck; Supple no JVD, Cardiovascular: irr regular rate and rhythm with S1-S2, Pulmonary; clear bilaterally, Abdomen; soft nontender nondistended bowel sounds are active there is no palpable mass or  hepatosplenomegaly, Rectal ;exam not done, Ext; no clubbing cyanosis or edema skin warm and dry, Neuropsych ;mood and affect appropriate     Assessment & Plan:   #1 60 yo male due for follow up screening colonoscopy- negative exam 2006- pt asymptomatic and average risk #2 chronic anticoagulation- on Eliquis #3 chronic atrial fib #4 NICM- EF 30-35% #5 OSA  Plan; Patient will be scheduled for colonoscopy with Dr. Carlean Purl at [facility] due to ischemic cardiomyopathy. Procedure discussed in detail with patient and he is agreeable to proceed We'll communicate with Dr. Marlou Porch /Cardiology to  assure  that holding Eliquis for 48 hours prior to colonoscopy is reasonable for this patient. Of note patient does have an Egg allergy.   Amy Genia Harold PA-C 10/29/2015   Cc: Marin Olp, MD

## 2015-10-29 NOTE — Progress Notes (Signed)
Agree with Ms. Esterwood's assessment and plan. Carl E. Gessner, MD, FACG   

## 2015-10-30 NOTE — Telephone Encounter (Signed)
OK to hold Eliquis 2 days prior to procedure. Resume as soon as GI says OK.   Candee Furbish, MD

## 2015-11-03 NOTE — Telephone Encounter (Signed)
Lm on the home number for the patient . I advised her can hold the Eliquis 2 days prior to the procedure date of 12-24-2015.  He can resume it on 12-25-2015.  LM for the patient to call us if he has any questions. Left my name and number.

## 2015-11-12 ENCOUNTER — Other Ambulatory Visit (INDEPENDENT_AMBULATORY_CARE_PROVIDER_SITE_OTHER): Payer: 59

## 2015-11-12 DIAGNOSIS — Z0000 Encounter for general adult medical examination without abnormal findings: Secondary | ICD-10-CM | POA: Diagnosis not present

## 2015-11-12 LAB — POCT URINALYSIS DIPSTICK
BILIRUBIN UA: NEGATIVE
GLUCOSE UA: NEGATIVE
Ketones, UA: NEGATIVE
Leukocytes, UA: NEGATIVE
NITRITE UA: NEGATIVE
Protein, UA: NEGATIVE
RBC UA: NEGATIVE
Spec Grav, UA: 1.02
Urobilinogen, UA: 0.2
pH, UA: 7

## 2015-11-12 LAB — BASIC METABOLIC PANEL
BUN: 18 mg/dL (ref 6–23)
CO2: 31 mEq/L (ref 19–32)
Calcium: 8.7 mg/dL (ref 8.4–10.5)
Chloride: 105 mEq/L (ref 96–112)
Creatinine, Ser: 1.08 mg/dL (ref 0.40–1.50)
GFR: 73.86 mL/min (ref >60.00)
Glucose, Bld: 90 mg/dL (ref 70–99)
POTASSIUM: 4.7 meq/L (ref 3.5–5.1)
SODIUM: 141 meq/L (ref 135–145)

## 2015-11-12 LAB — LIPID PANEL
CHOL/HDL RATIO: 3
Cholesterol: 126 mg/dL (ref 0–200)
HDL: 37.9 mg/dL — AB (ref >39.00)
LDL CALC: 68 mg/dL (ref 0–99)
NonHDL: 88.08
TRIGLYCERIDES: 100 mg/dL (ref 0.0–149.0)
VLDL: 20 mg/dL (ref 0.0–40.0)

## 2015-11-12 LAB — HEPATIC FUNCTION PANEL
ALT: 27 U/L (ref 0–53)
AST: 21 U/L (ref 0–37)
Albumin: 3.8 g/dL (ref 3.5–5.2)
Alkaline Phosphatase: 43 U/L (ref 39–117)
BILIRUBIN TOTAL: 0.9 mg/dL (ref 0.2–1.2)
Bilirubin, Direct: 0.2 mg/dL (ref 0.0–0.3)
Total Protein: 5.9 g/dL — ABNORMAL LOW (ref 6.0–8.3)

## 2015-11-12 LAB — CBC WITH DIFFERENTIAL/PLATELET
Basophils Absolute: 0 10*3/uL (ref 0.0–0.1)
Basophils Relative: 0.7 % (ref 0.0–3.0)
EOS PCT: 1.5 % (ref 0.0–5.0)
Eosinophils Absolute: 0.1 10*3/uL (ref 0.0–0.7)
HEMATOCRIT: 48.5 % (ref 39.0–52.0)
HEMOGLOBIN: 16.2 g/dL (ref 13.0–17.0)
LYMPHS PCT: 25.2 % (ref 12.0–46.0)
Lymphs Abs: 1.6 10*3/uL (ref 0.7–4.0)
MCHC: 33.4 g/dL (ref 30.0–36.0)
MCV: 90.1 fl (ref 78.0–100.0)
MONO ABS: 0.6 10*3/uL (ref 0.1–1.0)
MONOS PCT: 8.9 % (ref 3.0–12.0)
Neutro Abs: 4 10*3/uL (ref 1.4–7.7)
Neutrophils Relative %: 63.7 % (ref 43.0–77.0)
Platelets: 175 10*3/uL (ref 150.0–400.0)
RBC: 5.38 Mil/uL (ref 4.22–5.81)
RDW: 13.9 % (ref 11.5–15.5)
WBC: 6.3 10*3/uL (ref 4.0–10.5)

## 2015-11-12 LAB — TSH: TSH: 1.45 u[IU]/mL (ref 0.35–4.50)

## 2015-11-12 LAB — PSA: PSA: 0.14 ng/mL (ref 0.10–4.00)

## 2015-11-19 ENCOUNTER — Encounter: Payer: Self-pay | Admitting: Family Medicine

## 2015-11-19 ENCOUNTER — Ambulatory Visit (INDEPENDENT_AMBULATORY_CARE_PROVIDER_SITE_OTHER): Payer: 59 | Admitting: Family Medicine

## 2015-11-19 VITALS — BP 120/78 | HR 80 | Temp 98.4°F | Ht 76.0 in | Wt 280.0 lb

## 2015-11-19 DIAGNOSIS — Z0000 Encounter for general adult medical examination without abnormal findings: Secondary | ICD-10-CM

## 2015-11-19 MED ORDER — FLUTICASONE PROPIONATE 50 MCG/ACT NA SUSP: 2.0000 | Freq: Every day | NASAL | Status: DC

## 2015-11-19 MED FILL — FLUTICASONE PROP 50 MCG SPR: 50 | 30 days supply | Qty: 16 | Fill #0

## 2015-11-19 MED FILL — ELIQUIS 5 MG TABLET: 5 | 30 days supply | Qty: 60 | Fill #1

## 2015-11-19 NOTE — Patient Instructions (Addendum)
Things overall look great. Glad heart rate finally controlled.   Try to irrigate left ear- if not effective. Consider mineral oil in laxative aisle 4 drops 2-3 x a day for several days to see if that can loosen and remove cerumen  Also with allergy symptoms- try flonase for a good month. If continue to have the left ear feeling of need to pop the ear- refer to ENT at that point- just send me a mychart message and we can enter

## 2015-11-19 NOTE — Progress Notes (Signed)
[name] Reddish, MD Phone: [phone]  Subjective:  Patient presents today for their annual physical. Chief complaint-noted.   See problem oriented charting- ROS- full  review of systems was completed and negative. He remains in a fib but has no symptoms including no palpations. No chest pain or shortness of breath. No headache or blurry vision. No lightheadedness. Does have some ear and allergy symptoms as noted below  The following were reviewed and entered/updated in epic: Past Medical History  Diagnosis Date  . Hyperlipidemia   . Diverticulosis   . Vitreous anomalies 2014    Tack down of vitreous tear  . Hypertension   . Atrial fibrillation [facility])    Patient Active Problem List   Diagnosis Date Noted  . Atrial fibrillation ([place]) 09/11/2014    Priority: Medium  . Hyperlipidemia 06/23/2007    Priority: Medium  . Obesity (BMI 30-39.9) 09/19/2013    Priority: Low  . H/O calcium pyrophosphate deposition disease (CPPD) 10/16/2009    Priority: Low  . NICM (nonischemic cardiomyopathy) ([place]) 04/02/2015   Past Surgical History  Procedure Laterality Date  . Colonoscopy    . Eye surgery  2014  . Cardioversion N/A 11/20/2014    Procedure: CARDIOVERSION;  Surgeon: Pixie Casino, MD;  Location[facility] ENDOSCOPY;  Service: Cardiovascular;  Laterality: N/A;  11:38 Etomidate given IV for unsuccessful synched cardioversion @ 150 joules from Afib, 11:39 repeated synched cardioversion @ 200 joules successfully from Afib to SR. 12 lead EKG ordered to confirm..  . Cardiac catheterization N/A 04/30/2015    Procedure: Left Heart Cath and Coronary Angiography;  Surgeon: Jerline Pain, MD;  Location: [facility] CV LAB;  Service: Cardiovascular;  Laterality: N/A;    Family History  Problem Relation Age of Onset  . Asthma Mother   . Brain cancer Father     not genetic  . Alzheimer's disease Mother     diagnosed 76  . Other Brother     gilbert disease  . Heart attack Maternal Grandmother   .  Heart attack Maternal Uncle   . Colon cancer Neg Hx   . Esophageal cancer Neg Hx   . Pancreatic cancer Neg Hx   . Liver disease Neg Hx     Medications- reviewed and updated Current Outpatient Prescriptions  Medication Sig Dispense Refill  . atorvastatin (LIPITOR) 10 MG tablet Take 1 tablet (10 mg total) by mouth daily. 90 tablet 3  . digoxin (LANOXIN) 0.25 MG tablet Take 1 tablet (0.25 mg total) by mouth daily. 90 tablet 3  . ELIQUIS 5 MG TABS tablet TAKE 1 TABLET BY MOUTH TWICE DAILY 60 tablet 11  . lisinopril (PRINIVIL,ZESTRIL) 5 MG tablet TAKE 1 TABLET BY MOUTH ONCE DAILY 30 tablet 6  . metoprolol succinate (TOPROL-XL) 200 MG 24 hr tablet Take 1 tablet (200 mg total) by mouth daily. Take with or immediately following a meal. 90 tablet 3  . Na Sulfate-K Sulfate-Mg Sulf SOLN Take 1 kit by mouth once. 354 mL 0   Allergies-reviewed and updated Allergies  Allergen Reactions  . Eggs Or Egg-Derived Products Anaphylaxis    REACTION: no vaccines    Social History   Social History  . Marital Status: Married    Spouse Name: N/A  . Number of Children: N/A  . Years of Education: N/A   Social History Main Topics  . Smoking status: Never Smoker   . Smokeless tobacco: Never Used  . Alcohol Use: 0.0 oz/week    0 Standard drinks or equivalent  per week     Comment: 1 drink once a month max  . Drug Use: No  . Sexual Activity: Yes   Other Topics Concern  . None   Social History Narrative   Married 1977. 2 children (son in doctorate program at [employer] and daughter with marketing group in [place]). No grandkids   [employer] [place]   [employer] for [employer].       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 3 dogs, 1 cat, 1 snake.      Hobbies: fishing on reservoirs in [place], family time, nature, hiking    ROS--See HPI   Objective: BP 120/78 mmHg  Pulse 80  Temp(Src) 98.4 F (36.9 C)  Ht '6\' 4"'  (1.93 m)  Wt 280 lb (127.007  kg)  BMI 34.10 kg/m2 Gen: NAD, resting comfortably HEENT: Mucous membranes are moist. Oropharynx normal Neck: no thyromegaly CV: RRR no murmurs rubs or gallops Lungs: CTAB no crackles, wheeze, rhonchi Abdomen: soft/nontender/nondistended/normal bowel sounds. No rebound or guarding.  Rectal: normal tone, diffusely enlarged prostate, no masses or tenderness Ext: no edema Skin: warm, dry Neuro: grossly normal, moves all extremities, PERRLA  Assessment/Plan:  60 y.o. male presenting for annual physical.  Health Maintenance counseling: 1. Anticipatory guidance: Patient counseled regarding regular dental exams, eye exams, wearing seatbelts.  2. Risk factor reduction:  Advised patient of need for regular exercise (more sedentary in winter- hoping to get out more with regular exercise) and diet rich and fruits and vegetables to reduce risk of heart attack and stroke.  3. Immunizations/screenings/ancillary studies- declines flu Immunization History  Administered Date(s) Administered  . Td 02/16/2000, 10/03/2008   Health Maintenance Due  Topic Date Due  . Hepatitis C Screening - decline 1955/12/02  . HIV Screening -decline 10/20/1969  . ZOSTAVAX - declines 10/20/2014  . COLONOSCOPY - in march 10/01/2015   4. Prostate cancer screening- very low risk including rectal and PSA   Lab Results  Component Value Date   PSA 0.14 11/12/2015   PSA 0.14 09/04/2014   PSA 0.16 02/23/2013   5. Colon cancer screening - scheduled in march 6. Skin cancer screening- 1 spot on right elbow- slight scale. noneyrthematous base- will monitor. No other concerns  Hyperlipidemia- doing well on atorvastatin 36m with LDL 68  Persistent A fib- followed by dr. TLovena Le On digoxin and eliquis and metoprolol  About a month left ear fullness, some decrease in hearing, feels like needs to pop his ears. L ear with cerumen impaction- will irrigate. Will also try month of flonase and if continued issues- refer to ENT  after a month.   Return in about 1 year (around 11/18/2016) for physical. sooner if you need [place] . Return precautions advised.   Meds ordered this encounter  Medications  . fluticasone (FLONASE) 50 MCG/ACT nasal spray    Sig: Place 2 sprays into both nostrils daily.    Dispense:  16 g    Refill:  6

## 2015-12-03 MED FILL — DIGOXIN 250 MCG TABLET: 250 | 90 days supply | Qty: 90 | Fill #1

## 2015-12-04 ENCOUNTER — Telehealth: Payer: Self-pay | Admitting: *Deleted

## 2015-12-04 NOTE — Telephone Encounter (Signed)
LM again on patient's home number.  Advised him to hold his Eliquis 2 days prior to the procedure date. He can be off a total of 3 days.  Advised  he can call me, if he has any questions.

## 2015-12-15 MED FILL — SUPREP BOWEL PREP KIT: 17.5-3.13-1 | 1 days supply | Qty: 354 | Fill #0

## 2015-12-15 MED FILL — ATORVASTATIN 10 MG TABLET: 10 | 90 days supply | Qty: 90 | Fill #0

## 2015-12-16 ENCOUNTER — Encounter: Payer: 59 | Admitting: Gastroenterology

## 2015-12-24 ENCOUNTER — Encounter (HOSPITAL_COMMUNITY)
Admission: RE | Disposition: A | Discharge status: Home / Self Care | Payer: Self-pay | Source: Ambulatory Visit | Attending: Internal Medicine

## 2015-12-24 ENCOUNTER — Encounter (HOSPITAL_COMMUNITY): Payer: Self-pay

## 2015-12-24 ENCOUNTER — Ambulatory Visit (HOSPITAL_COMMUNITY)
Admission: RE | Admit: 2015-12-24 | Discharge: 2015-12-24 | Disposition: A | Discharge status: Home / Self Care | Payer: 59 | Source: Ambulatory Visit | Attending: Internal Medicine | Admitting: Internal Medicine

## 2015-12-24 DIAGNOSIS — K5730 Diverticulosis of large intestine without perforation or abscess without bleeding: Secondary | ICD-10-CM | POA: Diagnosis not present

## 2015-12-24 DIAGNOSIS — Z79899 Other long term (current) drug therapy: Secondary | ICD-10-CM | POA: Diagnosis not present

## 2015-12-24 DIAGNOSIS — Z7951 Long term (current) use of inhaled steroids: Secondary | ICD-10-CM | POA: Insufficient documentation

## 2015-12-24 DIAGNOSIS — E785 Hyperlipidemia, unspecified: Secondary | ICD-10-CM | POA: Insufficient documentation

## 2015-12-24 DIAGNOSIS — Z860101 Personal history of adenomatous and serrated colon polyps: Secondary | ICD-10-CM | POA: Insufficient documentation

## 2015-12-24 DIAGNOSIS — I10 Essential (primary) hypertension: Secondary | ICD-10-CM | POA: Diagnosis not present

## 2015-12-24 DIAGNOSIS — D125 Benign neoplasm of sigmoid colon: Secondary | ICD-10-CM | POA: Insufficient documentation

## 2015-12-24 DIAGNOSIS — Z7901 Long term (current) use of anticoagulants: Secondary | ICD-10-CM | POA: Diagnosis not present

## 2015-12-24 DIAGNOSIS — Z1211 Encounter for screening for malignant neoplasm of colon: Secondary | ICD-10-CM | POA: Diagnosis not present

## 2015-12-24 DIAGNOSIS — I4891 Unspecified atrial fibrillation: Secondary | ICD-10-CM | POA: Diagnosis not present

## 2015-12-24 DIAGNOSIS — Z86010 Personal history of colonic polyps: Secondary | ICD-10-CM | POA: Insufficient documentation

## 2015-12-24 DIAGNOSIS — D123 Benign neoplasm of transverse colon: Secondary | ICD-10-CM | POA: Insufficient documentation

## 2015-12-24 HISTORY — PX: COLONOSCOPY: SHX5424

## 2015-12-24 SURGERY — COLONOSCOPY
Anesthesia: Moderate Sedation

## 2015-12-24 MED ORDER — MIDAZOLAM HCL 5 MG/5ML IJ SOLN
INTRAMUSCULAR | Status: DC | PRN
  Administered 2015-12-24: 2 mg via INTRAVENOUS
  Administered 2015-12-24: 1 mg via INTRAVENOUS
  Administered 2015-12-24: 2 mg via INTRAVENOUS

## 2015-12-24 MED ORDER — MIDAZOLAM HCL 5 MG/ML IJ SOLN
INTRAMUSCULAR | Status: AC
Start: 2015-12-24 — End: 2015-12-24
  Filled 2015-12-24: qty 2

## 2015-12-24 MED ORDER — APIXABAN 5 MG PO TABS: 5.0000 mg | ORAL_TABLET | Freq: Two times a day (BID) | ORAL | Status: DC

## 2015-12-24 MED ORDER — SODIUM CHLORIDE 0.9 % IV SOLN: INTRAVENOUS | Status: DC

## 2015-12-24 MED ORDER — FENTANYL CITRATE (PF) 100 MCG/2ML IJ SOLN
INTRAMUSCULAR | Status: AC
Start: 2015-12-24 — End: 2015-12-24
  Filled 2015-12-24: qty 2

## 2015-12-24 MED ORDER — DIPHENHYDRAMINE HCL 50 MG/ML IJ SOLN
INTRAMUSCULAR | Status: AC
  Filled 2015-12-24: qty 1

## 2015-12-24 MED ORDER — FENTANYL CITRATE (PF) 100 MCG/2ML IJ SOLN
INTRAMUSCULAR | Status: DC | PRN
  Administered 2015-12-24 (×3): 25 ug via INTRAVENOUS

## 2015-12-24 MED ORDER — LACTATED RINGERS IV SOLN: INTRAVENOUS | Status: DC

## 2015-12-24 MED FILL — ELIQUIS 5 MG TABLET: 5 | 30 days supply | Qty: 60 | Fill #0

## 2015-12-24 NOTE — Op Note (Signed)
[facility] Patient Name: [name] Aguilar Procedure Date: 12/24/2015 MRN: [id number] Attending MD: Gatha Mayer , MD Date of Birth: 08/19/1956 CSN:  Age: 60 Admit Type: Outpatient Account #: [id number] Procedure:            Colonoscopy Indications:          Screening for colorectal malignant neoplasm Providers:            Gatha Mayer, MD, Malka So, RN, Cletis Athens,                        Technician Referring MD:          Medicines:            Midazolam 5 mg IV, Fentanyl 75 micrograms IV Complications:        No immediate complications. Estimated Blood Loss: Estimated blood loss: none. Procedure:            Pre-Anesthesia Assessment:                       - Prior to the procedure, a History and Physical was                        performed, and patient medications and allergies were                        reviewed. The patient's tolerance of previous                        anesthesia was also reviewed. The risks and benefits of                        the procedure and the sedation options and risks were                        discussed with the patient. All questions were                        answered, and informed consent was obtained. Prior                        Anticoagulants: The patient last took Eliquis                        (apixaban) 2 days prior to the procedure. ASA Grade                        Assessment: III - A patient with severe systemic                        disease. After reviewing the risks and benefits, the                        patient was deemed in satisfactory condition to undergo                        the procedure.                       After obtaining informed consent, the colonoscope was  passed under direct vision. Throughout the procedure,                        the patient's blood pressure, pulse, and oxygen                        saturations were monitored continuously. The [id number]         [id number]) scope was introduced through the anus and                        advanced to the the cecum, identified by appendiceal                        orifice and ileocecal valve. The colonoscopy was                        performed without difficulty. The patient tolerated the                        procedure well. The quality of the bowel preparation                        was good. The ileocecal valve, appendiceal orifice, and                        rectum were photographed. Scope In: 8:37:18 AM Scope Out: 8:57:36 AM Scope Withdrawal Time: 0 hours 16 minutes 7 seconds  Total Procedure Duration: 0 hours 20 minutes 18 seconds  Findings:      The perianal and digital rectal examinations were normal. Pertinent       negatives include normal prostate (size, shape, and consistency).      Two sessile polyps were found in the sigmoid colon and transverse colon.       The polyps were 5 mm in size. These polyps were removed with a cold       snare. Resection and retrieval were complete.      Diverticula were found in the sigmoid colon.      The exam was otherwise without abnormality on direct and retroflexion       views. Impression:           - Two 5 mm polyps in the sigmoid colon and in the                        transverse colon, removed with a cold snare. Resected                        and retrieved.                       - Diverticulosis in the sigmoid colon.                       - The examination was otherwise normal on direct and                        retroflexion views. Moderate Sedation:      Moderate (conscious) sedation was administered by the endoscopy nurse       and supervised by the endoscopist. The following parameters were  monitored: oxygen saturation, heart rate, blood pressure, respiratory       rate, EKG, adequacy of pulmonary ventilation, and response to care.       Total physician intraservice time was 14 minutes. Recommendation:       - Discharge patient  to home.                       - Patient has a contact number available for                        emergencies. The signs and symptoms of potential                        delayed complications were discussed with the patient.                        Return to normal activities tomorrow. Written discharge                        instructions were provided to the patient.                       - Resume previous diet.                       - Resume Eliquis (apixaban) at prior dose tomorrow.                       - Await pathology results.                       - Repeat colonoscopy for surveillance based on                        pathology results. Procedure Code(s):    --- Professional ---                       [phone], Colonoscopy, flexible; with removal of tumor(s),                        polyp(s), or other lesion(s) by snare technique                       99152, Moderate sedation services provided by the same                        physician or other qualified health care professional                        performing the diagnostic or therapeutic service that                        the sedation supports, requiring the presence of an                        independent trained observer to assist in the                        monitoring of the patient's level of consciousness and  physiological status; initial 15 minutes of                        intraservice time, patient age 31 years or older Diagnosis Code(s):    --- Professional ---                       Z12.11, Encounter for screening for malignant neoplasm                        of colon                       D12.5, Benign neoplasm of sigmoid colon                       D12.3, Benign neoplasm of transverse colon (hepatic                        flexure or splenic flexure)                       K57.30, Diverticulosis of large intestine without                        perforation or abscess without bleeding CPT  copyright 2016 American Medical Association. All rights reserved. The codes documented in this report are preliminary and upon coder review may  be revised to meet current compliance requirements. Attending Participation: Gatha Mayer, MD Gatha Mayer, MD 12/24/2015 9:18:19 AM Number of Addenda: 0

## 2015-12-24 NOTE — H&P (Signed)
[facility] History and Physical   Primary Care Physician:  Garret Reddish, MD   Reason for Procedure:   colon cancer screening  Plan:    Colonoscopy The risks and benefits as well as alternatives of endoscopic procedure(s) have been discussed and reviewed. All questions answered. The patient agrees to proceed.      HPI: [name] Aguilar is a 60 y.o. male here for a routine screening colonoscopy. Apixaban was held prior to procedure.   Past Medical History  Diagnosis Date  . Hyperlipidemia   . Diverticulosis   . Vitreous anomalies 2014    Tack down of vitreous tear  . Hypertension   . Atrial fibrillation [facility])     Past Surgical History  Procedure Laterality Date  . Colonoscopy    . Eye surgery  2014  . Cardioversion N/A 11/20/2014    Procedure: CARDIOVERSION;  Surgeon: Pixie Casino, MD;  Location: [facility] ENDOSCOPY;  Service: Cardiovascular;  Laterality: N/A;  11:38 Etomidate given IV for unsuccessful synched cardioversion @ 150 joules from Afib, 11:39 repeated synched cardioversion @ 200 joules successfully from Afib to SR. 12 lead EKG ordered to confirm..  . Cardiac catheterization N/A 04/30/2015    Procedure: Left Heart Cath and Coronary Angiography;  Surgeon: Jerline Pain, MD;  Location: [place] [facility];  Service: Cardiovascular;  Laterality: N/A;    Prior to Admission medications   Medication Sig Start Date End Date Taking? Authorizing Provider  atorvastatin (LIPITOR) 10 MG tablet Take 1 tablet (10 mg total) by mouth daily. 09/10/15  Yes Evans Lance, MD  digoxin (LANOXIN) 0.25 MG tablet Take 1 tablet (0.25 mg total) by mouth daily. 09/10/15  Yes Evans Lance, MD  fluticasone (FLONASE) 50 MCG/ACT nasal spray Place 2 sprays into both nostrils daily. 11/19/15  Yes Marin Olp, MD  lisinopril (PRINIVIL,ZESTRIL) 5 MG tablet TAKE 1 TABLET BY MOUTH ONCE DAILY 10/08/15  Yes Jerline Pain, MD  metoprolol succinate (TOPROL-XL) 200 MG 24 hr tablet Take 1  tablet (200 mg total) by mouth daily. Take with or immediately following a meal. 06/13/15  Yes Jerline Pain, MD  ELIQUIS 5 MG TABS tablet TAKE 1 TABLET BY MOUTH TWICE DAILY 09/24/15   Jerline Pain, MD    Current Facility-Administered Medications  Medication Dose Route Frequency Provider Last Rate Last Dose  . 0.9 %  sodium chloride infusion   Intravenous Continuous Amy S Esterwood, PA-C      . lactated ringers infusion   Intravenous Continuous Gatha Mayer, MD 50 mL/hr at 12/24/15 0744 1,000 mL at 12/24/15 0744    Allergies as of 10/29/2015 - Review Complete 10/29/2015  Allergen Reaction Noted  . Eggs or egg-derived products Anaphylaxis     Family History  Problem Relation Age of Onset  . Asthma Mother   . Brain cancer Father     not genetic  . Alzheimer's disease Mother     diagnosed 17  . Other Brother     gilbert disease  . Heart attack Maternal Grandmother   . Heart attack Maternal Uncle   . Colon cancer Neg Hx   . Esophageal cancer Neg Hx   . Pancreatic cancer Neg Hx   . Liver disease Neg Hx     Social History   Social History  . Marital Status: Married    Spouse Name: N/A  . Number of Children: N/A  . Years of Education: N/A   Occupational History  . Not on  file.   Social History Main Topics  . Smoking status: Never Smoker   . Smokeless tobacco: Never Used  . Alcohol Use: 0.0 oz/week    0 Standard drinks or equivalent per week     Comment: 1 drink once a month max  . Drug Use: No  . Sexual Activity: Yes   Other Topics Concern  . Not on file   Social History Narrative   Married 1977. 2 children (son in doctorate program at [employer] nearing end and daughter with [employer] in [place]). No grandkids   [employer] [place]   [employer] for [employer].       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 3 dogs, 1 cat, 1 snake.      Hobbies: fishing on reservoirs in [place], family time, nature, hiking     Review of Systems: All other review of systems negative except as mentioned in the HPI.  Physical Exam: Vital signs in last 24 hours: Temp:  [98.6 F (37 C)] 98.6 F (37 C) (03/08 0734) Pulse Rate:  [37-131] 37 (03/08 0826) Resp:  [13-20] 13 (03/08 0826) BP: (133-137)/(68-81) 133/68 mmHg (03/08 0826) SpO2:  [96 %-100 %] 100 % (03/08 0826) Weight:  [280 lb (127.007 kg)] 280 lb (127.007 kg) (03/08 0734)   General:   Alert,  Well-developed, well-nourished, pleasant and cooperative in NAD Lungs:  Clear throughout to auscultation.   Heart:  Regular rate and rhythm; no murmurs, clicks, rubs,  or gallops. Abdomen:  Soft, nontender and nondistended. Normal bowel sounds.   Neuro/Psych:  Alert and cooperative. Normal mood and affect. A and O x 3   @Zorawar Strollo  Simonne Maffucci, MD, [facility] [phone] (pager) 12/24/2015 8:31 AM@

## 2015-12-24 NOTE — Discharge Instructions (Signed)
° °  2 polyps removed  I will let you know pathology results and when to have another routine colonoscopy by mail.  YOU HAD AN ENDOSCOPIC PROCEDURE TODAY: Refer to the procedure report and other information in the discharge instructions given to you for any specific questions about what was found during the examination. If this information does not answer your questions, please call Dr. Celesta Aver office at [phone] to clarify.   YOU SHOULD EXPECT: Some feelings of bloating in the abdomen. Passage of more gas than usual. Walking can help get rid of the air that was put into your GI tract during the procedure and reduce the bloating. If you had a lower endoscopy (such as a colonoscopy or flexible sigmoidoscopy) you may notice spotting of blood in your stool or on the toilet paper. Some abdominal soreness may be present for a day or two, also.  DIET: Your first meal following the procedure should be a light meal and then it is ok to progress to your normal diet. A half-sandwich or bowl of soup is an example of a good first meal. Heavy or fried foods are harder to digest and may make you feel nauseous or bloated. Drink plenty of fluids but you should avoid alcoholic beverages for 24 hours.   ACTIVITY: Your care partner should take you home directly after the procedure. You should plan to take it easy, moving slowly for the rest of the day. You can resume normal activity the day after the procedure however YOU SHOULD NOT DRIVE, use power tools, machinery or perform tasks that involve climbing or major physical exertion for 24 hours (because of the sedation medicines used during the test).   SYMPTOMS TO REPORT IMMEDIATELY: A gastroenterologist can be reached at any hour. Please call [phone]  for any of the following symptoms:  Following lower endoscopy (colonoscopy, flexible sigmoidoscopy) Excessive amounts of blood in the stool  Significant tenderness, worsening of abdominal pains  Swelling  of the abdomen that is new, acute  Fever of 100 or higher  Following upper endoscopy (EGD, EUS, ERCP, esophageal dilation) Vomiting of blood or coffee ground material  New, significant abdominal pain  New, significant chest pain or pain under the shoulder blades  Painful or persistently difficult swallowing  New shortness of breath  Black, tarry-looking or red, bloody stools  FOLLOW UP:  If any biopsies were taken you will be contacted by phone or by letter within the next 1-3 weeks. Call [phone]  if you have not heard about the biopsies in 3 weeks.  Please also call with any specific questions about appointments or follow up tests.

## 2015-12-26 ENCOUNTER — Encounter: Payer: Self-pay | Admitting: Internal Medicine

## 2015-12-26 DIAGNOSIS — Z86010 Personal history of colonic polyps: Secondary | ICD-10-CM

## 2015-12-26 NOTE — Progress Notes (Signed)
Quick Note:  2 adenomas - repeat colonoscopy 2022 ______

## 2016-01-07 MED FILL — FLUTICASONE PROP 50 MCG SPR: 50 | 30 days supply | Qty: 16 | Fill #1

## 2016-01-14 MED FILL — LISINOPRIL 5 MG TABLET: 5 | 90 days supply | Qty: 90 | Fill #1

## 2016-01-28 MED FILL — METOPROLOL SUCC ER 200 MG T: 200 | 90 days supply | Qty: 90 | Fill #2

## 2016-01-28 MED FILL — ELIQUIS 5 MG TABLET: 5 | 30 days supply | Qty: 60 | Fill #1

## 2016-02-15 ENCOUNTER — Encounter: Payer: Self-pay | Admitting: Cardiology

## 2016-02-18 MED FILL — ELIQUIS 5 MG TABLET: 5 | 30 days supply | Qty: 60 | Fill #2

## 2016-02-24 MED FILL — FLUTICASONE PROP 50 MCG SPR: 50 | 30 days supply | Qty: 16 | Fill #2

## 2016-03-03 MED FILL — DIGOXIN 250 MCG TABLET: 250 | 90 days supply | Qty: 90 | Fill #2

## 2016-03-17 MED FILL — ATORVASTATIN 10 MG TABLET: 10 | 90 days supply | Qty: 90 | Fill #1

## 2016-03-24 MED FILL — ELIQUIS 5 MG TABLET: 5 | 30 days supply | Qty: 60 | Fill #3

## 2016-03-31 MED FILL — FLUTICASONE PROP 50 MCG SPR: 50 | 30 days supply | Qty: 16 | Fill #3

## 2016-04-14 ENCOUNTER — Other Ambulatory Visit: Payer: Self-pay | Admitting: Cardiology

## 2016-04-14 MED FILL — LISINOPRIL 5 MG TABLET: 5 | 30 days supply | Qty: 30 | Fill #2

## 2016-04-21 ENCOUNTER — Ambulatory Visit (INDEPENDENT_AMBULATORY_CARE_PROVIDER_SITE_OTHER): Payer: 59

## 2016-04-21 ENCOUNTER — Ambulatory Visit (INDEPENDENT_AMBULATORY_CARE_PROVIDER_SITE_OTHER): Payer: 59 | Admitting: Internal Medicine

## 2016-04-21 ENCOUNTER — Encounter: Payer: Self-pay | Admitting: Internal Medicine

## 2016-04-21 VITALS — BP 110/68 | HR 97 | Ht 76.0 in | Wt 280.2 lb

## 2016-04-21 DIAGNOSIS — I481 Persistent atrial fibrillation: Secondary | ICD-10-CM

## 2016-04-21 DIAGNOSIS — I4819 Other persistent atrial fibrillation: Secondary | ICD-10-CM

## 2016-04-21 MED ORDER — DIGOXIN 250 MCG PO TABS: 0.2500 mg | ORAL_TABLET | Freq: Every day | ORAL | Status: DC

## 2016-04-21 MED ORDER — ATORVASTATIN CALCIUM 10 MG PO TABS: 10.0000 mg | ORAL_TABLET | Freq: Every day | ORAL | Status: DC

## 2016-04-21 MED ORDER — LISINOPRIL 5 MG PO TABS: 5.0000 mg | ORAL_TABLET | Freq: Every day | ORAL | Status: DC

## 2016-04-21 MED ORDER — METOPROLOL SUCCINATE ER 200 MG PO TB24: 200.0000 mg | ORAL_TABLET | Freq: Every day | ORAL | Status: DC

## 2016-04-21 MED FILL — ELIQUIS 5 MG TABLET: 5 | 30 days supply | Qty: 60 | Fill #4

## 2016-04-21 MED FILL — METOPROLOL SUCC ER 200 MG T: 200 | 90 days supply | Qty: 90 | Fill #3

## 2016-04-21 NOTE — Patient Instructions (Signed)
Medication Instructions:  Your physician recommends that you continue on your current medications as directed. Please refer to the Current Medication list given to you today.  Labwork: None ordered  Testing/Procedures: Your physician has recommended that you wear a holter monitor. Holter monitors are medical devices that record the heart's electrical activity. Doctors most often use these monitors to diagnose arrhythmias. Arrhythmias are problems with the speed or rhythm of the heartbeat. The monitor is a small, portable device. You can wear one while you do your normal daily activities. This is usually used to diagnose what is causing palpitations/syncope (passing out).  Follow-Up: To be determined once holter monitor has been reviewed by physician.  We will call you with the results.  If you need a refill on your cardiac medications before your next appointment, please call your pharmacy.  Thank you for choosing [facility]!!   Trinidad Curet, RN [phone]   Any Other Special Instructions Will Be Listed Below (If Applicable). Holter Monitoring A Holter monitor is a small device that is used to detect abnormal heart rhythms. It clips to your clothing and is connected by wires to flat, sticky disks (electrodes) that attach to your chest. It is worn continuously for 24-48 hours. HOME CARE INSTRUCTIONS  Wear your Holter monitor at all times, even while exercising and sleeping, for as long as directed by your health care provider.  Make sure that the Holter monitor is safely clipped to your clothing or close to your body as recommended by your health care provider.  Do not get the monitor or wires wet.  Do not put body lotion or moisturizer on your chest.  Keep your skin clean.  Keep a diary of your daily activities, such as walking and doing chores. If you feel that your heartbeat is abnormal or that your heart is fluttering or skipping a beat:  Record what you are doing  when it happens.  Record what time of day the symptoms occur.  Return your Holter monitor as directed by your health care provider.  Keep all follow-up visits as directed by your health care provider. This is important. SEEK IMMEDIATE MEDICAL CARE IF:  You feel lightheaded or you faint.  You have trouble breathing.  You feel pain in your chest, upper arm, or jaw.  You feel sick to your stomach and your skin is pale, cool, or damp.  You heartbeat feels unusual or abnormal.   This information is not intended to replace advice given to you by your health care provider. Make sure you discuss any questions you have with your health care provider.   Document Released: 07/02/2004 Document Revised: 10/25/2014 Document Reviewed: 05/13/2014 Elsevier Interactive Patient Education [employer].

## 2016-04-21 NOTE — Progress Notes (Signed)
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 He is a pleasant middle aged man who was found to have atrial fib and LV dysfunction over a year ago. In the interim he has undergone attempted DCCV but he did not maintain NSR. His heart failure is very well compensated and appears to be class 1 on maximal medical therapy. He does not feel palpitations. He has never had syncope.  When I saw him last, his rate was not well controlled and his dose of metoprolol was increased.  Allergies  Allergen Reactions  . Eggs Or Egg-Derived Products Anaphylaxis    REACTION: no vaccines     Current Outpatient Prescriptions  Medication Sig Dispense Refill  . apixaban (ELIQUIS) 5 MG TABS tablet Take 1 tablet (5 mg total) by mouth 2 (two) times daily. 60 tablet 11  . atorvastatin (LIPITOR) 10 MG tablet Take 1 tablet (10 mg total) by mouth daily. 90 tablet 3  . digoxin (LANOXIN) 0.25 MG tablet Take 1 tablet (0.25 mg total) by mouth daily. 90 tablet 3  . fluticasone (FLONASE) 50 MCG/ACT nasal spray Place 2 sprays into both nostrils daily. 16 g 6  . lisinopril (PRINIVIL,ZESTRIL) 5 MG tablet Take 1 tablet (5 mg total) by mouth daily. 90 tablet 3  . metoprolol (TOPROL-XL) 200 MG 24 hr tablet Take 1 tablet (200 mg total) by mouth daily. Take with or immediately following a meal. 90 tablet 3   No current facility-administered medications for this visit.     Past Medical History  Diagnosis Date  . Hyperlipidemia   . Diverticulosis   . Vitreous anomalies 2014    Tack down of vitreous tear  . Hypertension   . Atrial fibrillation ([place])   . Hx of adenomatous colonic polyps     ROS:   All systems reviewed and negative except as noted in the HPI.   Past Surgical History  Procedure Laterality Date  . Colonoscopy    . Eye surgery  2014  . Cardioversion N/A 11/20/2014    Procedure: CARDIOVERSION;  Surgeon: Pixie Casino, MD;  Location: [facility] ENDOSCOPY;  Service:  Cardiovascular;  Laterality: N/A;  11:38 Etomidate given IV for unsuccessful synched cardioversion @ 150 joules from Afib, 11:39 repeated synched cardioversion @ 200 joules successfully from Afib to SR. 12 lead EKG ordered to confirm..  . Cardiac catheterization N/A 04/30/2015    Procedure: Left Heart Cath and Coronary Angiography;  Surgeon: Jerline Pain, MD;  Location: [facility] LAB;  Service: Cardiovascular;  Laterality: N/A;  . Colonoscopy N/A 12/24/2015    Procedure: COLONOSCOPY ( ANAPHALAXIS ALLERGY TO EGGS);  Surgeon: Gatha Mayer, MD;  Location: [facility];  Service: Endoscopy;  Laterality: N/A;  anaphalaxis allergy to eggs     Family History  Problem Relation Age of Onset  . Asthma Mother   . Brain cancer Father     not genetic  . Alzheimer's disease Mother     diagnosed 31  . Other Brother     gilbert disease  . Heart attack Maternal Grandmother   . Heart attack Maternal Uncle   . Colon cancer Neg Hx   . Esophageal cancer Neg Hx   . Pancreatic cancer Neg Hx   . Liver disease Neg Hx      Social History   Social History  . Marital Status: Married    Spouse Name: N/A  . Number of Children: N/A  . Years of Education: N/A  Occupational History  . Not on file.   Social History Main Topics  . Smoking status: Never Smoker   . Smokeless tobacco: Never Used  . Alcohol Use: 0.0 oz/week    0 Standard drinks or equivalent per week     Comment: 1 drink once a month max  . Drug Use: No  . Sexual Activity: Yes   Other Topics Concern  . Not on file   Social History Narrative   Married 1977. 2 children (son in doctorate program at [employer] nearing end and daughter with [employer] in [place]). No grandkids   [employer] [place]   [employer] for [employer].       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 3 dogs, 1 cat, 1 snake.      Hobbies: fishing on reservoirs in [place], family time, nature, hiking      BP 110/68 mmHg  Pulse 97  Ht 6\' 4"  (1.93 m)  Wt 280 lb 3.2 oz (127.098 kg)  BMI 34.12 kg/m2  Physical Exam:  Well appearing middle aged man, NAD HEENT: Unremarkable Neck:  7 cm JVD, no thyromegally Lymphatics:  No adenopathy Back:  No CVA tenderness Lungs:  Clear with no wheezes, rales, or rhonchi HEART:  IRegular rate rhythm, no murmurs, no rubs, no clicks Abd:  soft, positive bowel sounds, no organomegally, no rebound, no guarding Ext:  2 plus pulses, no edema, no cyanosis, no clubbing Skin:  No rashes no nodules Neuro:  CN II through XII intact, motor grossly intact  EKG - atrial fib with a controlled VR  Assess/Plan: 1. Atrial fib - I have recommended he obtain another holter on his higher dose of metoprolol and digoxin. He will continue his anti-coagulation. 2. Non-ischemic CM - he remains class 1. He will continue his current meds. No indication for ICD with non-ischemic CM and class 1 symptoms. 3. Obesity - I have asked him to lose weight.  4. Dyslipidemia - he will continue his atorvastatin.  Mikle Bosworth.D.

## 2016-05-05 ENCOUNTER — Encounter: Payer: Self-pay | Admitting: Internal Medicine

## 2016-05-05 MED FILL — CLINDAMYCIN HCL 150 MG CAPS: 150 | 7 days supply | Qty: 28 | Fill #0

## 2016-05-06 ENCOUNTER — Other Ambulatory Visit: Payer: Self-pay | Admitting: *Deleted

## 2016-05-06 DIAGNOSIS — I4891 Unspecified atrial fibrillation: Secondary | ICD-10-CM

## 2016-05-07 ENCOUNTER — Telehealth: Payer: Self-pay | Admitting: Internal Medicine

## 2016-05-07 MED ORDER — VERAPAMIL HCL ER 120 MG PO TBCR: 120.0000 mg | EXTENDED_RELEASE_TABLET | Freq: Every day | ORAL | Status: DC

## 2016-05-07 MED FILL — VERAPAMIL ER 120 MG TABLET: 120 | 90 days supply | Qty: 90 | Fill #0

## 2016-05-07 NOTE — Telephone Encounter (Signed)
New Message  Pt stated that his new medication- verapomil has not been sent to [facility]. Please call back and discuss.

## 2016-05-07 NOTE — Telephone Encounter (Signed)
Dr Lovena Le is aware that patient is on all of the medications she is on and wanted to start Verapamil 120 mg daily.  Rx called in.  Patient aware

## 2016-05-12 MED FILL — FLUTICASONE PROP 50 MCG SPR: 50 | 30 days supply | Qty: 16 | Fill #4

## 2016-05-12 MED FILL — LISINOPRIL 5 MG TABLET: 5 | 30 days supply | Qty: 30 | Fill #0

## 2016-05-24 ENCOUNTER — Other Ambulatory Visit: Payer: 59

## 2016-05-25 ENCOUNTER — Ambulatory Visit (INDEPENDENT_AMBULATORY_CARE_PROVIDER_SITE_OTHER): Payer: 59 | Admitting: *Deleted

## 2016-05-25 ENCOUNTER — Other Ambulatory Visit: Payer: 59 | Admitting: *Deleted

## 2016-05-25 DIAGNOSIS — I481 Persistent atrial fibrillation: Secondary | ICD-10-CM | POA: Diagnosis not present

## 2016-05-25 DIAGNOSIS — I4891 Unspecified atrial fibrillation: Secondary | ICD-10-CM

## 2016-05-25 DIAGNOSIS — I4819 Other persistent atrial fibrillation: Secondary | ICD-10-CM

## 2016-05-25 MED ORDER — DIGOXIN 250 MCG PO TABS: 125.0000 ug | ORAL_TABLET | Freq: Every day | ORAL | 3 refills | Status: DC

## 2016-05-25 MED FILL — ELIQUIS 5 MG TABLET: 5 | 30 days supply | Qty: 60 | Fill #5

## 2016-05-25 NOTE — Patient Instructions (Signed)
Your physician has recommended you make the following change in your medication:  1.) decrease digoxin to 1/2 tablet (125 mcg) daily.

## 2016-05-25 NOTE — Progress Notes (Signed)
1.) Reason for visit: EKG  2.) Name of MD requesting visit: Dr. Lovena Le  3.) H&P: Seen early in July, with orders for holter after increasing dose of metoprolol.  On holter average HR was 108, verapamil 120 mg daily was added.  Here today for dig level and EKG.  4.) ROS related to problem: Pt has no complaints.  Feels about the same as he did prior to med change.  HR IS 48, ATRIAL FIB SLOW VR  Assessment and plan per MD:  Reviewed with Dr. Tamala Julian (DOD).  Digoxin decreased to 125 mcg daily.  Dig level drawn today.

## 2016-05-26 LAB — DIGOXIN LEVEL: DIGOXIN LVL: 1.2 ug/L (ref 0.8–2.0)

## 2016-06-09 ENCOUNTER — Encounter: Payer: Self-pay | Admitting: Internal Medicine

## 2016-06-16 MED FILL — DIGOXIN 250 MCG TABLET: 250 | 90 days supply | Qty: 90 | Fill #3

## 2016-06-16 MED FILL — LISINOPRIL 5 MG TABLET: 5 | 30 days supply | Qty: 30 | Fill #1

## 2016-06-16 MED FILL — ATORVASTATIN 10 MG TABLET: 10 | 90 days supply | Qty: 90 | Fill #2

## 2016-06-23 MED FILL — ELIQUIS 5 MG TABLET: 5 | 30 days supply | Qty: 60 | Fill #6

## 2016-06-23 MED FILL — FLUTICASONE PROP 50 MCG SPR: 50 | 30 days supply | Qty: 16 | Fill #5

## 2016-07-14 MED FILL — LISINOPRIL 5 MG TABLET: 5 | 30 days supply | Qty: 30 | Fill #2

## 2016-07-21 MED FILL — ELIQUIS 5 MG TABLET: 5 | 30 days supply | Qty: 60 | Fill #7

## 2016-07-28 MED FILL — VERAPAMIL ER 120 MG TABLET: 120 | 90 days supply | Qty: 90 | Fill #1

## 2016-07-28 MED FILL — METOPROLOL SUCC ER 200 MG T: 200 | 90 days supply | Qty: 90 | Fill #0

## 2016-08-04 MED FILL — FLUTICASONE PROP 50 MCG SPR: 50 | 30 days supply | Qty: 16 | Fill #6

## 2016-08-17 MED FILL — LISINOPRIL 5 MG TABLET: 5 | 30 days supply | Qty: 30 | Fill #3

## 2016-08-17 MED FILL — ELIQUIS 5 MG TABLET: 5 | 30 days supply | Qty: 60 | Fill #8

## 2016-08-18 ENCOUNTER — Other Ambulatory Visit: Payer: Self-pay | Admitting: Cardiology

## 2016-08-18 NOTE — Telephone Encounter (Signed)
lisinopril (PRINIVIL,ZESTRIL) 5 MG tablet  Medication  Date: 04/21/2016 Department: [facility] Office Ordering/Authorizing: Evans Lance, MD  Order Providers   Prescribing Provider Encounter Provider  Evans Lance, MD Evans Lance, MD  Medication Detail    Disp Refills Start End   lisinopril (PRINIVIL,ZESTRIL) 5 MG tablet 90 tablet 3 04/21/2016    Sig - Route: Take 1 tablet (5 mg total) by mouth daily. - Oral   E-Prescribing Status: Receipt confirmed by pharmacy (04/21/2016 8:14 AM EDT)   [place], [place]

## 2016-09-15 ENCOUNTER — Other Ambulatory Visit: Payer: Self-pay | Admitting: Family Medicine

## 2016-09-15 MED FILL — LISINOPRIL 5 MG TABLET: 5 | 90 days supply | Qty: 90 | Fill #0

## 2016-09-15 MED FILL — FLUTICASONE PROP 50 MCG SPR: 50 | 30 days supply | Qty: 16 | Fill #0

## 2016-09-15 MED FILL — ATORVASTATIN 10 MG TABLET: 10 | 90 days supply | Qty: 90 | Fill #0

## 2016-09-22 MED FILL — ELIQUIS 5 MG TABLET: 5 | 30 days supply | Qty: 60 | Fill #9

## 2016-10-20 MED FILL — METOPROLOL SUCC ER 200 MG T: 200 | 90 days supply | Qty: 90 | Fill #1

## 2016-10-20 MED FILL — ELIQUIS 5 MG TABLET: 5 | 30 days supply | Qty: 60 | Fill #10

## 2016-10-27 MED FILL — FLUTICASONE PROP 50 MCG SPR: 50 | 30 days supply | Qty: 16 | Fill #1

## 2016-11-03 ENCOUNTER — Encounter (HOSPITAL_COMMUNITY): Payer: Self-pay | Admitting: *Deleted

## 2016-11-03 ENCOUNTER — Telehealth (HOSPITAL_COMMUNITY): Payer: Self-pay | Admitting: *Deleted

## 2016-11-03 ENCOUNTER — Ambulatory Visit (HOSPITAL_COMMUNITY)
Admission: EM | Admit: 2016-11-03 | Discharge: 2016-11-03 | Disposition: A | Discharge status: Home / Self Care | Payer: 59 | Attending: Family Medicine | Admitting: Family Medicine

## 2016-11-03 DIAGNOSIS — J40 Bronchitis, not specified as acute or chronic: Secondary | ICD-10-CM | POA: Diagnosis not present

## 2016-11-03 DIAGNOSIS — J0100 Acute maxillary sinusitis, unspecified: Secondary | ICD-10-CM | POA: Diagnosis not present

## 2016-11-03 MED ORDER — LEVOFLOXACIN 500 MG PO TABS: 500.0000 mg | ORAL_TABLET | Freq: Every day | ORAL | 0 refills | Status: DC

## 2016-11-03 MED ORDER — HYDROCODONE-HOMATROPINE 5-1.5 MG/5ML PO SYRP: 5.0000 mL | ORAL_SOLUTION | Freq: Four times a day (QID) | ORAL | 0 refills | Status: DC | PRN

## 2016-11-03 MED FILL — HYDROCODONE-HOMATROPINE SYR: 5-1.5 | 6 days supply | Qty: 120 | Fill #0

## 2016-11-03 MED FILL — levoFLOXacin 500 MG TABS: 500 | 7 days supply | Qty: 7 | Fill #0

## 2016-11-03 MED FILL — VERAPAMIL ER 120 MG TABLET: 120 | 90 days supply | Qty: 90 | Fill #2

## 2016-11-03 NOTE — ED Provider Notes (Signed)
[facility]    CSN: [id number] Arrival date & time: 11/03/16  1054     History   Chief Complaint Chief Complaint  Patient presents with  . Nasal Congestion  . Cough    HPI [name] Aguilar is a 61 y.o. male.   This is 61 year old man with known chronic atrial fibrillation who presents with nasal congestion and cough. 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 He initially developed rhinorrhea and congestion 2 weeks ago. Over the last 4-5 days she's gotten progressively worse and the nasal discharge has become thicker with yellow-green color. He's also developed a cough and low-grade temperature.  He's had no shortness of breath.      Past Medical History:  Diagnosis Date  . Atrial fibrillation ([place])   . Diverticulosis   . Hx of adenomatous colonic polyps   . Hyperlipidemia   . Hypertension   . Vitreous anomalies 2014   Tack down of vitreous tear    Patient Active Problem List   Diagnosis Date Noted  . Hx of adenomatous colonic polyps   . NICM (nonischemic cardiomyopathy) ([place]) 04/02/2015  . Atrial fibrillation ([place]) 09/11/2014  . Obesity (BMI 30-39.9) 09/19/2013  . H/O calcium pyrophosphate deposition disease (CPPD) 10/16/2009  . Hyperlipidemia 06/23/2007    Past Surgical History:  Procedure Laterality Date  . CARDIAC CATHETERIZATION N/A 04/30/2015   Procedure: Left Heart Cath and Coronary Angiography;  Surgeon: Jerline Pain, MD;  Location: [facility] LAB;  Service: Cardiovascular;  Laterality: N/A;  . CARDIOVERSION N/A 11/20/2014   Procedure: CARDIOVERSION;  Surgeon: Pixie Casino, MD;  Location: [facility] ENDOSCOPY;  Service: Cardiovascular;  Laterality: N/A;  11:38 Etomidate given IV for unsuccessful synched cardioversion @ 150 joules from Afib, 11:39 repeated synched cardioversion @ 200 joules successfully from Afib to SR. 12 lead EKG ordered to confirm..  . COLONOSCOPY    . COLONOSCOPY N/A 12/24/2015   Procedure:  COLONOSCOPY ( ANAPHALAXIS ALLERGY TO EGGS);  Surgeon: Gatha Mayer, MD;  Location: [facility] ENDOSCOPY;  Service: Endoscopy;  Laterality: N/A;  anaphalaxis allergy to eggs  . EYE SURGERY  2014       Home Medications    Prior to Admission medications   Medication Sig Start Date End Date Taking? Authorizing Provider  apixaban (ELIQUIS) 5 MG TABS tablet Take 1 tablet (5 mg total) by mouth 2 (two) times daily. 12/24/15  Yes Gatha Mayer, MD  atorvastatin (LIPITOR) 10 MG tablet Take 1 tablet (10 mg total) by mouth daily. 04/21/16  Yes Evans Lance, MD  digoxin (LANOXIN) 0.25 MG tablet Take 0.5 tablets (125 mcg total) by mouth daily. 05/25/16  Yes Belva Crome, MD  fluticasone [facility]) 50 MCG/ACT nasal spray INSTILL 2 SPRAYS INTO BOTH NOSTRILS DAILY 09/15/16  Yes Marin Olp, MD  lisinopril (PRINIVIL,ZESTRIL) 5 MG tablet Take 1 tablet (5 mg total) by mouth daily. 04/21/16  Yes Evans Lance, MD  metoprolol (TOPROL-XL) 200 MG 24 hr tablet Take 1 tablet (200 mg total) by mouth daily. Take with or immediately following a meal. 04/21/16  Yes Evans Lance, MD  verapamil (CALAN-SR) 120 MG CR tablet Take 1 tablet (120 mg total) by mouth at bedtime. 05/07/16  Yes Evans Lance, MD    Family History Family History  Problem Relation Age of Onset  . Asthma Mother   . Alzheimer's disease Mother     diagnosed 21  . Brain cancer Father  not genetic  . Other Brother     gilbert disease  . Heart attack Maternal Grandmother   . Heart attack Maternal Uncle   . Colon cancer Neg Hx   . Esophageal cancer Neg Hx   . Pancreatic cancer Neg Hx   . Liver disease Neg Hx     Social History Social History  Substance Use Topics  . Smoking status: Never Smoker  . Smokeless tobacco: Never Used  . Alcohol use 0.0 oz/week     Comment: 1 drink once a month max     Allergies   Eggs or egg-derived products   Review of Systems Review of Systems  Constitutional: Positive for fatigue and fever.  HENT:  Positive for congestion and postnasal drip.   Eyes: Negative.   Respiratory: Positive for cough. Negative for shortness of breath.   Cardiovascular: Negative.   Gastrointestinal: Negative.   Genitourinary: Negative.   Musculoskeletal: Negative.      Physical Exam Triage Vital Signs ED Triage Vitals [11/03/16 1126]  Enc Vitals Group     BP 106/70     Pulse Rate (!) 130     Resp      Temp 100.8 F (38.2 C)     Temp Source Oral     SpO2 100 %     Weight      Height      Head Circumference      Peak Flow      Pain Score      Pain Loc      Pain Edu?      Excl. in [place]?    No data found.   Updated Vital Signs BP 106/70 (BP Location: Right Arm)   Pulse (!) 130 Comment: HR afib 120-150  Temp 100.8 F (38.2 C) (Oral)   SpO2 100%    Physical Exam  Constitutional: He is oriented to person, place, and time. He appears well-developed and well-nourished.  HENT:  Head: Normocephalic.  Right Ear: External ear normal.  Left Ear: External ear normal.  Mouth/Throat: Oropharynx is clear and moist.  Eyes: Conjunctivae and EOM are normal.  Neck: Normal range of motion. Neck supple.  Cardiovascular:  Rapid irregular heart rate.  Pulmonary/Chest: He has rales.  Musculoskeletal: Normal range of motion.  Neurological: He is alert and oriented to person, place, and time.  Skin: Skin is warm and dry.  Nursing note and vitals reviewed.    UC Treatments / Results  Labs (all labs ordered are listed, but only abnormal results are displayed) Labs Reviewed - No data to display  EKG  EKG Interpretation None       Radiology No results found.  Procedures Procedures (including critical care time)  Medications Ordered in UC Medications - No data to display   Initial Impression / Assessment and Plan / UC Course  I have reviewed the triage vital signs and the nursing notes.  Pertinent labs & imaging results that were available during my care of the patient were reviewed by  me and considered in my medical decision making (see chart for details).  Clinical Course     Final Clinical Impressions(s) / UC Diagnoses   Final diagnoses:  None    New Prescriptions New Prescriptions   No medications on file     Robyn Haber, MD 11/03/16 1139

## 2016-11-03 NOTE — ED Triage Notes (Signed)
Patient reports nasal congestion and cough x 1.5 weeks, patient reports sob. Unsure of fevers but feels like he has had one. Patient has not taken any medications recently for the symptoms.

## 2016-11-17 MED FILL — ELIQUIS 5 MG TABLET: 5 | 30 days supply | Qty: 60 | Fill #11

## 2016-12-01 ENCOUNTER — Other Ambulatory Visit (INDEPENDENT_AMBULATORY_CARE_PROVIDER_SITE_OTHER): Payer: 59

## 2016-12-01 DIAGNOSIS — Z0000 Encounter for general adult medical examination without abnormal findings: Secondary | ICD-10-CM | POA: Diagnosis not present

## 2016-12-01 LAB — CBC WITH DIFFERENTIAL/PLATELET
BASOS ABS: 0.1 10*3/uL (ref 0.0–0.1)
BASOS PCT: 1 % (ref 0.0–3.0)
EOS ABS: 0.1 10*3/uL (ref 0.0–0.7)
Eosinophils Relative: 1.4 % (ref 0.0–5.0)
HEMATOCRIT: 43.9 % (ref 39.0–52.0)
HEMOGLOBIN: 14.4 g/dL (ref 13.0–17.0)
LYMPHS PCT: 19.7 % (ref 12.0–46.0)
Lymphs Abs: 1.3 10*3/uL (ref 0.7–4.0)
MCHC: 32.9 g/dL (ref 30.0–36.0)
MCV: 88.2 fl (ref 78.0–100.0)
MONOS PCT: 6.5 % (ref 3.0–12.0)
Monocytes Absolute: 0.4 10*3/uL (ref 0.1–1.0)
NEUTROS ABS: 4.6 10*3/uL (ref 1.4–7.7)
Neutrophils Relative %: 71.4 % (ref 43.0–77.0)
PLATELETS: 226 10*3/uL (ref 150.0–400.0)
RBC: 4.98 Mil/uL (ref 4.22–5.81)
RDW: 14.9 % (ref 11.5–15.5)
WBC: 6.4 10*3/uL (ref 4.0–10.5)

## 2016-12-01 LAB — LIPID PANEL
CHOL/HDL RATIO: 3
Cholesterol: 105 mg/dL (ref 0–200)
HDL: 31.3 mg/dL — ABNORMAL LOW (ref >39.00)
LDL CALC: 60 mg/dL (ref 0–99)
NONHDL: 73.69
Triglycerides: 68 mg/dL (ref 0.0–149.0)
VLDL: 13.6 mg/dL (ref 0.0–40.0)

## 2016-12-01 LAB — POC URINALSYSI DIPSTICK (AUTOMATED)
Bilirubin, UA: NEGATIVE
Blood, UA: NEGATIVE
Glucose, UA: NEGATIVE
Ketones, UA: NEGATIVE
LEUKOCYTES UA: NEGATIVE
Nitrite, UA: NEGATIVE
PH UA: 6
Spec Grav, UA: 1.025
UROBILINOGEN UA: 0.2

## 2016-12-01 LAB — BASIC METABOLIC PANEL
BUN: 20 mg/dL (ref 6–23)
CALCIUM: 8.8 mg/dL (ref 8.4–10.5)
CHLORIDE: 106 meq/L (ref 96–112)
CO2: 31 meq/L (ref 19–32)
CREATININE: 0.97 mg/dL (ref 0.40–1.50)
GFR: 83.32 mL/min (ref >60.00)
Glucose, Bld: 114 mg/dL — ABNORMAL HIGH (ref 70–99)
Potassium: 5.1 mEq/L (ref 3.5–5.1)
SODIUM: 141 meq/L (ref 135–145)

## 2016-12-01 LAB — HEPATIC FUNCTION PANEL
ALK PHOS: 42 U/L (ref 39–117)
ALT: 25 U/L (ref 0–53)
AST: 17 U/L (ref 0–37)
Albumin: 3.7 g/dL (ref 3.5–5.2)
BILIRUBIN DIRECT: 0.2 mg/dL (ref 0.0–0.3)
BILIRUBIN TOTAL: 0.6 mg/dL (ref 0.2–1.2)
Total Protein: 6 g/dL (ref 6.0–8.3)

## 2016-12-01 LAB — PSA: PSA: 0.21 ng/mL (ref 0.10–4.00)

## 2016-12-01 LAB — TSH: TSH: 1.64 u[IU]/mL (ref 0.35–4.50)

## 2016-12-08 ENCOUNTER — Ambulatory Visit (INDEPENDENT_AMBULATORY_CARE_PROVIDER_SITE_OTHER): Payer: 59 | Admitting: Family Medicine

## 2016-12-08 ENCOUNTER — Encounter: Payer: Self-pay | Admitting: Family Medicine

## 2016-12-08 VITALS — BP 98/60 | HR 85 | Temp 98.4°F | Ht 73.75 in | Wt 279.0 lb

## 2016-12-08 DIAGNOSIS — I481 Persistent atrial fibrillation: Secondary | ICD-10-CM | POA: Diagnosis not present

## 2016-12-08 DIAGNOSIS — E785 Hyperlipidemia, unspecified: Secondary | ICD-10-CM

## 2016-12-08 DIAGNOSIS — Z0000 Encounter for general adult medical examination without abnormal findings: Secondary | ICD-10-CM | POA: Diagnosis not present

## 2016-12-08 DIAGNOSIS — R739 Hyperglycemia, unspecified: Secondary | ICD-10-CM | POA: Diagnosis not present

## 2016-12-08 DIAGNOSIS — I4819 Other persistent atrial fibrillation: Secondary | ICD-10-CM

## 2016-12-08 MED ORDER — HYDROCODONE-HOMATROPINE 5-1.5 MG/5ML PO SYRP: 5.0000 mL | ORAL_SOLUTION | Freq: Four times a day (QID) | ORAL | 0 refills | Status: DC | PRN

## 2016-12-08 MED FILL — HYDROCODONE-HOMATROPINE SYR: 5-1.5 | 8 days supply | Qty: 120 | Fill #0

## 2016-12-08 NOTE — Progress Notes (Signed)
Phone: [phone]  Subjective:  Patient presents today for their annual physical. Chief complaint-noted.   See problem oriented charting- ROS- full  review of systems was completed and negative except for: nocturia 1-2x a night. No chest pain or shorntess of breath  The following were reviewed and entered/updated in epic: Past Medical History:  Diagnosis Date  . Atrial fibrillation ([place])   . Diverticulosis   . Hx of adenomatous colonic polyps   . Hyperlipidemia   . Hypertension   . Vitreous anomalies 2014   Tack down of vitreous tear   Patient Active Problem List   Diagnosis Date Noted  . NICM (nonischemic cardiomyopathy) ([place]) 04/02/2015    Priority: Medium  . Atrial fibrillation ([place]) 09/11/2014    Priority: Medium  . Hyperlipidemia 06/23/2007    Priority: Medium  . Hx of adenomatous colonic polyps     Priority: Low  . Obesity (BMI 30-39.9) 09/19/2013    Priority: Low  . H/O calcium pyrophosphate deposition disease (CPPD) 10/16/2009    Priority: Low  . Hyperglycemia 12/08/2016   Past Surgical History:  Procedure Laterality Date  . CARDIAC CATHETERIZATION N/A 04/30/2015   Procedure: Left Heart Cath and Coronary Angiography;  Surgeon: Jerline Pain, MD;  Location: [facility];  Service: Cardiovascular;  Laterality: N/A;  . CARDIOVERSION N/A 11/20/2014   Procedure: CARDIOVERSION;  Surgeon: Pixie Casino, MD;  [facility] ENDOSCOPY;  Service: Cardiovascular;  Laterality: N/A;  11:38 Etomidate given IV for unsuccessful synched cardioversion @ 150 joules from Afib, 11:39 repeated synched cardioversion @ 200 joules successfully from Afib to SR. 12 lead EKG ordered to confirm..  . COLONOSCOPY    . COLONOSCOPY N/A 12/24/2015   Procedure: COLONOSCOPY ( ANAPHALAXIS ALLERGY TO EGGS);  Surgeon: Gatha Mayer, MD;  Location: [facility];  Service: Endoscopy;  Laterality: N/A;  anaphalaxis allergy to eggs  . EYE SURGERY  2014    Family History  Problem Relation Age of  Onset  . Asthma Mother   . Alzheimer's disease Mother     diagnosed 24  . Brain cancer Father     not genetic  . Other Brother     gilbert disease  . Heart attack Maternal Grandmother   . Heart attack Maternal Uncle   . Colon cancer Neg Hx   . Esophageal cancer Neg Hx   . Pancreatic cancer Neg Hx   . Liver disease Neg Hx     Medications- reviewed and updated Current Outpatient Prescriptions  Medication Sig Dispense Refill  . apixaban (ELIQUIS) 5 MG TABS tablet Take 1 tablet (5 mg total) by mouth 2 (two) times daily. 60 tablet 11  . atorvastatin (LIPITOR) 10 MG tablet Take 1 tablet (10 mg total) by mouth daily. 90 tablet 3  . digoxin (LANOXIN) 0.25 MG tablet Take 0.5 tablets (125 mcg total) by mouth daily. 90 tablet 3  . HYDROcodone-homatropine (HYCODAN) 5-1.5 MG/5ML syrup Take 5 mLs by mouth every 6 (six) hours as needed for cough. 120 mL 0  . lisinopril (PRINIVIL,ZESTRIL) 5 MG tablet Take 1 tablet (5 mg total) by mouth daily. 90 tablet 3  . metoprolol (TOPROL-XL) 200 MG 24 hr tablet Take 1 tablet (200 mg total) by mouth daily. Take with or immediately following a meal. 90 tablet 3  . verapamil (CALAN-SR) 120 MG CR tablet Take 1 tablet (120 mg total) by mouth at bedtime. 90 tablet 3  . fluticasone (FLONASE) 50 MCG/ACT nasal spray INSTILL 2 SPRAYS INTO BOTH NOSTRILS DAILY (Patient  not taking: Reported on 12/08/2016) 16 g 6   No current facility-administered medications for this visit.     Allergies-reviewed and updated Allergies  Allergen Reactions  . Eggs Or Egg-Derived Products Anaphylaxis    REACTION: no vaccines    Social History   Social History  . Marital status: Married    Spouse name: N/A  . Number of children: N/A  . Years of education: N/A   Social History Main Topics  . Smoking status: Never Smoker  . Smokeless tobacco: Never Used  . Alcohol use 0.0 oz/week     Comment: 1 drink once a month max  . Drug use: No  . Sexual activity: Yes   Other Topics  Concern  . None   Social History Narrative   Married 1977. 2 children (son in doctorate program at [employer] nearing end and daughter with [employer] in [place]). No grandkids   [employer] [place]   [employer] for [employer].       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 3 dogs, 1 cat, 1 snake.      Hobbies: fishing on reservoirs in [place], family time, nature, hiking    Objective: BP 98/60 (BP Location: Left Arm, Patient Position: Sitting, Cuff Size: Large)   Pulse 85   Temp 98.4 F (36.9 C) (Oral)   Ht 6' 1.75" (1.873 m)   Wt 279 lb (126.6 kg)   SpO2 98%   BMI 36.06 kg/m  Gen: NAD, resting comfortably HEENT: Mucous membranes are moist. Oropharynx normal Neck: no thyromegaly CV: irregularly irregular no murmurs rubs or gallops Lungs: CTAB no crackles, wheeze, rhonchi Abdomen: soft/nontender/nondistended/normal bowel sounds. No rebound or guarding.  Ext: 1+ edema Skin: warm, dry Neuro: grossly normal, moves all extremities, PERRLA Rectal: normal tone, mild diffusely enlarged prostate, no masses or tenderness   Assessment/Plan:  61 y.o. male presenting for annual physical.  Health Maintenance counseling: 1. Anticipatory guidance: Patient counseled regarding regular dental exams -q6 months, eye exams - needs updated exam about every 2 years, wearing seatbelts.  2. Risk factor reduction:  Advised patient of need for regular exercise and diet rich and fruits and vegetables to reduce risk of heart attack and stroke. Exercise- was doing reasonable until got ill- every other day half hour walking- hoping to get back to this. Diet-wife is dietician- he states heknows he could cut down on portions but has healthy food choices. Not really a sweet tooth. .  Wt Readings from Last 3 Encounters:  12/08/16 279 lb (126.6 kg)  04/21/16 280 lb 3.2 oz (127.1 kg)  12/24/15 280 lb (127 kg)  3. Immunizations/screenings/ancillary studies.  Declines shot- prior had declined due to egg allergy- right now holding off as recovering from illness Immunization History  Administered Date(s) Administered  . Td 02/16/2000, 10/03/2008  4. Prostate cancer screening- low risk psa trend, rectal with BPH and notes nocturia 1-2x a night. Continue psa monitoring  Lab Results  Component Value Date   PSA 0.21 12/01/2016   PSA 0.14 11/12/2015   PSA 0.14 09/04/2014   5. Colon cancer screening - 2 adenomas in past- 2022 recall planned 6. Skin cancer screening- does not see dermatology. Should use sunscreen  Status of chronic or acute concerns  A fib- persistent. compliant with eliquis 5mg  for anticoaguation. Also on digoxin (had halved dose due to lower HR), metoprolol 200mg  24 hr, verapamil 120mg  CR.   Allergies- on flonase but not  with recent bronchitis.   Treated for bronchitis- lingering sinusitis. Will give cough medicine for nighttime. May need to treat in future again.   Hyperlipidemia Trial reduction to every other day with excellent LDL and increased diabetes risk  Hyperglycemia  controlled on atorvastatin 10mg  but has hyperglycemia with CBG of 114- we will trial every other day atorvastatin to reduce glycemic risk.   Meds ordered this encounter  Medications  . HYDROcodone-homatropine (HYCODAN) 5-1.5 MG/5ML syrup    Sig: Take 5 mLs by mouth every 6 (six) hours as needed for cough.    Dispense:  120 mL    Refill:  0    Return precautions advised.  Garret Reddish, MD

## 2016-12-08 NOTE — Progress Notes (Signed)
Pre visit review using our clinic review tool, if applicable. No additional management support is needed unless otherwise documented below in the visit note. 

## 2016-12-08 NOTE — Assessment & Plan Note (Signed)
controlled on atorvastatin 10mg  but has hyperglycemia with CBG of 114- we will trial every other day atorvastatin to reduce glycemic risk.

## 2016-12-08 NOTE — Assessment & Plan Note (Signed)
Trial reduction to every other day with excellent LDL and increased diabetes risk

## 2016-12-08 NOTE — Patient Instructions (Addendum)
Change atorvastatin 10mg  to every other day  Glad you are eating healthy foods- would try to decrease portions to help with weight loss  Hopefully can get back to exercising when feeling better  I would be willing to send in a longer round of antibiotic such as augmentin 10-14 days if you do not continue to improve from sinus perspective. Cough medicine given- do not drive for 8 hours after taking this  ______________________________________________________________________  Starting October 1st 2018, I will be transferring to our new location: [facility] (corner of [address] from [employer]) [place], [place] Phone: [phone]  I would love to have you remain my patient at this new location as long as it remains convenient for you. I am excited about the opportunity to have x-ray and sports medicine in the new building but will really miss the awesome staff and physicians at [facility]. Continue to schedule appointments at [facility] and we will automatically transfer them to the [place] location starting October 1st. We cannot yet schedule lab visits at new locatoin though.

## 2016-12-15 MED FILL — DIGOXIN 250 MCG TABLET: 250 | 90 days supply | Qty: 90 | Fill #0

## 2016-12-15 MED FILL — LISINOPRIL 5 MG TABLET: 5 | 90 days supply | Qty: 90 | Fill #1

## 2016-12-22 MED FILL — ATORVASTATIN 10 MG TABLET: 10 | 90 days supply | Qty: 90 | Fill #1

## 2016-12-23 ENCOUNTER — Telehealth: Payer: Self-pay | Admitting: Family Medicine

## 2016-12-23 ENCOUNTER — Other Ambulatory Visit: Payer: Self-pay

## 2016-12-23 MED ORDER — APIXABAN 5 MG PO TABS: 5.0000 mg | ORAL_TABLET | Freq: Two times a day (BID) | ORAL | 11 refills | Status: DC

## 2016-12-23 NOTE — Telephone Encounter (Signed)
Refill sent to the pharmacy as requested.

## 2016-12-23 NOTE — Telephone Encounter (Signed)
Pt need new Rx for ELIQUIS   Pharm:  [facility] outpt pharmary pt state that he will be out of the medication today.

## 2016-12-24 MED FILL — ELIQUIS 5 MG TABLET: 5 | 30 days supply | Qty: 60 | Fill #0

## 2017-01-05 ENCOUNTER — Encounter: Payer: 59 | Admitting: Family Medicine

## 2017-01-12 ENCOUNTER — Encounter: Payer: Self-pay | Admitting: Internal Medicine

## 2017-01-12 ENCOUNTER — Ambulatory Visit (INDEPENDENT_AMBULATORY_CARE_PROVIDER_SITE_OTHER): Payer: 59 | Admitting: Internal Medicine

## 2017-01-12 VITALS — BP 110/56 | HR 65 | Ht 76.0 in | Wt 277.0 lb

## 2017-01-12 DIAGNOSIS — I428 Other cardiomyopathies: Secondary | ICD-10-CM | POA: Diagnosis not present

## 2017-01-12 DIAGNOSIS — I4819 Other persistent atrial fibrillation: Secondary | ICD-10-CM

## 2017-01-12 DIAGNOSIS — I481 Persistent atrial fibrillation: Secondary | ICD-10-CM | POA: Diagnosis not present

## 2017-01-12 NOTE — Progress Notes (Signed)
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 He is a pleasant middle aged man who was found to have atrial fib and LV dysfunction over a year ago. In the interim he has undergone attempted DCCV but he did not maintain NSR. His heart failure is very well compensated and appears to be class 1 on maximal medical therapy. He does not feel palpitations. He has never had syncope.  When I saw him last, his rate was not well controlled and his dose of metoprolol was increased. He has done well in the interim. He denies chest pain or sob. Minimal edema when he is on his feet all day.  Allergies  Allergen Reactions  . Eggs Or Egg-Derived Products Anaphylaxis    REACTION: no vaccines     Current Outpatient Prescriptions  Medication Sig Dispense Refill  . apixaban (ELIQUIS) 5 MG TABS tablet Take 1 tablet (5 mg total) by mouth 2 (two) times daily. 60 tablet 11  . atorvastatin (LIPITOR) 10 MG tablet Take 10 mg by mouth every other day.    . digoxin (LANOXIN) 0.25 MG tablet Take 0.5 tablets (125 mcg total) by mouth daily. 90 tablet 3  . fluticasone (FLONASE) 50 MCG/ACT nasal spray INSTILL 2 SPRAYS INTO BOTH NOSTRILS DAILY 16 g 6  . lisinopril (PRINIVIL,ZESTRIL) 5 MG tablet Take 1 tablet (5 mg total) by mouth daily. 90 tablet 3  . metoprolol (TOPROL-XL) 200 MG 24 hr tablet Take 1 tablet (200 mg total) by mouth daily. Take with or immediately following a meal. 90 tablet 3  . verapamil (CALAN-SR) 120 MG CR tablet Take 1 tablet (120 mg total) by mouth at bedtime. 90 tablet 3   No current facility-administered medications for this visit.      Past Medical History:  Diagnosis Date  . Atrial fibrillation ([place])   . Diverticulosis   . Hx of adenomatous colonic polyps   . Hyperlipidemia   . Hypertension   . Vitreous anomalies 2014   Tack down of vitreous tear    ROS:   All systems reviewed and negative except as noted in the HPI.   Past Surgical History:    Procedure Laterality Date  . CARDIAC CATHETERIZATION N/A 04/30/2015   Procedure: Left Heart Cath and Coronary Angiography;  Surgeon: Jerline Pain, MD;  Location: [facility];  Service: Cardiovascular;  Laterality: N/A;  . CARDIOVERSION N/A 11/20/2014   Procedure: CARDIOVERSION;  Surgeon: Pixie Casino, MD;  Location: [facility] ENDOSCOPY;  Service: Cardiovascular;  Laterality: N/A;  11:38 Etomidate given IV for unsuccessful synched cardioversion @ 150 joules from Afib, 11:39 repeated synched cardioversion @ 200 joules successfully from Afib to SR. 12 lead EKG ordered to confirm..  . COLONOSCOPY    . COLONOSCOPY N/A 12/24/2015   Procedure: COLONOSCOPY ( ANAPHALAXIS ALLERGY TO EGGS);  Surgeon: Gatha Mayer, MD;  Location: [facility];  Service: Endoscopy;  Laterality: N/A;  anaphalaxis allergy to eggs  . EYE SURGERY  2014     Family History  Problem Relation Age of Onset  . Asthma Mother   . Alzheimer's disease Mother     diagnosed 37  . Brain cancer Father     not genetic  . Other Brother     gilbert disease  . Heart attack Maternal Grandmother   . Heart attack Maternal Uncle   . Colon cancer Neg Hx   . Esophageal cancer Neg Hx   . Pancreatic cancer Neg Hx   .  Liver disease Neg Hx      Social History   Social History  . Marital status: Married    Spouse name: N/A  . Number of children: N/A  . Years of education: N/A   Occupational History  . Not on file.   Social History Main Topics  . Smoking status: Never Smoker  . Smokeless tobacco: Never Used  . Alcohol use 0.0 oz/week     Comment: 1 drink once a month max  . Drug use: No  . Sexual activity: Yes   Other Topics Concern  . Not on file   Social History Narrative   Married 1977. 2 children (son in doctorate program at [employer] nearing end and daughter with [facility] in [place]). 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 3 dogs, 1 cat, 1 snake.      Hobbies: fishing on reservoirs in [place], family time, nature, hiking     BP (!) 110/56   Pulse 65   Ht 6\' 4"  (1.93 m)   Wt 277 lb (125.6 kg)   SpO2 97%   BMI 33.72 kg/m   Physical Exam:  Well appearing middle aged man, NAD HEENT: Unremarkable Neck:  7 cm JVD, no thyromegally Lymphatics:  No adenopathy Back:  No CVA tenderness Lungs:  Clear with no wheezes, rales, or rhonchi HEART:  Regular brady rhythm, no murmurs, no rubs, no clicks Abd:  soft, positive bowel sounds, no organomegally, no rebound, no guarding Ext:  2 plus pulses, no edema, no cyanosis, no clubbing Skin:  No rashes no nodules Neuro:  CN II through XII intact, motor grossly intact  EKG - atrial fib with a slow VR  Assess/Plan: 1. Atrial fib - I have recommended he stop his digoxin and continue his beta blocker and verapamil. He will call us if he has any worsening of his symptoms. 2. Non-ischemic CM - he remains class 1. He will continue his current meds except as noted above. No indication for ICD with non-ischemic CM and class 1 symptoms. 3. Obesity - I have asked him to lose weight.  4. Dyslipidemia - he will continue his atorvastatin.  Mikle Bosworth.D.

## 2017-01-12 NOTE — Patient Instructions (Addendum)
Your physician has recommended you make the following change in your medication:  1.) stop digoxin  Your physician wants you to follow-up in: 1 year with Dr. Lovena Le.  You will receive a reminder letter in the mail two months in advance. If you don't receive a letter, please call our office to schedule the follow-up appointment.

## 2017-01-19 MED FILL — ELIQUIS 5 MG TABLET: 5 | 30 days supply | Qty: 60 | Fill #1

## 2017-01-26 MED FILL — FLUTICASONE PROP 50 MCG SPR: 50 | 30 days supply | Qty: 16 | Fill #2

## 2017-01-26 MED FILL — VERAPAMIL ER 120 MG TABLET: 120 | 90 days supply | Qty: 90 | Fill #3

## 2017-01-26 MED FILL — METOPROLOL SUCC ER 200 MG T: 200 | 90 days supply | Qty: 90 | Fill #2

## 2017-02-16 MED FILL — ELIQUIS 5 MG TABLET: 5 | 30 days supply | Qty: 60 | Fill #2

## 2017-03-16 MED FILL — FLUTICASONE PROP 50 MCG SPR: 50 | 30 days supply | Qty: 16 | Fill #3

## 2017-03-16 MED FILL — ELIQUIS 5 MG TABLET: 5 | 30 days supply | Qty: 60 | Fill #3

## 2017-03-16 MED FILL — LISINOPRIL 5 MG TABLET: 5 | 90 days supply | Qty: 90 | Fill #2

## 2017-03-23 DIAGNOSIS — H5213 Myopia, bilateral: Secondary | ICD-10-CM | POA: Diagnosis not present

## 2017-03-23 DIAGNOSIS — H52203 Unspecified astigmatism, bilateral: Secondary | ICD-10-CM | POA: Diagnosis not present

## 2017-03-23 DIAGNOSIS — H524 Presbyopia: Secondary | ICD-10-CM | POA: Diagnosis not present

## 2017-04-14 MED FILL — FLUTICASONE PROP 50 MCG SPR: 50 | 30 days supply | Qty: 16 | Fill #4

## 2017-04-14 MED FILL — ELIQUIS 5 MG TABLET: 5 | 30 days supply | Qty: 60 | Fill #4

## 2017-04-25 ENCOUNTER — Other Ambulatory Visit: Payer: Self-pay | Admitting: Internal Medicine

## 2017-04-25 MED FILL — METOPROLOL SUCC ER 200 MG T: 200 | 90 days supply | Qty: 90 | Fill #0

## 2017-04-25 MED FILL — VERAPAMIL ER 120 MG TABLET: 120 | 90 days supply | Qty: 90 | Fill #0

## 2017-05-17 ENCOUNTER — Other Ambulatory Visit: Payer: Self-pay | Admitting: Internal Medicine

## 2017-05-17 MED FILL — ELIQUIS 5 MG TABLET: 5 | 30 days supply | Qty: 60 | Fill #5

## 2017-05-17 MED FILL — ATORVASTATIN 10 MG TABLET: 10 | 90 days supply | Qty: 45 | Fill #0

## 2017-05-25 DIAGNOSIS — H40003 Preglaucoma, unspecified, bilateral: Secondary | ICD-10-CM | POA: Diagnosis not present

## 2017-05-25 DIAGNOSIS — H40013 Open angle with borderline findings, low risk, bilateral: Secondary | ICD-10-CM | POA: Diagnosis not present

## 2017-06-01 MED FILL — FLUTICASONE PROP 50 MCG SPR: 50 | 30 days supply | Qty: 16 | Fill #5

## 2017-06-14 ENCOUNTER — Other Ambulatory Visit: Payer: Self-pay | Admitting: Internal Medicine

## 2017-06-14 MED FILL — LISINOPRIL 5 MG TABLET: 5 | 90 days supply | Qty: 90 | Fill #0

## 2017-06-22 MED FILL — ELIQUIS 5 MG TABLET: 5 | 30 days supply | Qty: 60 | Fill #6

## 2017-07-13 MED FILL — FLUTICASONE PROP 50 MCG SPR: 50 | 30 days supply | Qty: 16 | Fill #6

## 2017-07-20 MED FILL — ELIQUIS 5 MG TABLET: 5 | 30 days supply | Qty: 60 | Fill #7

## 2017-07-20 MED FILL — VERAPAMIL ER 120 MG TABLET: 120 | 90 days supply | Qty: 90 | Fill #1

## 2017-07-20 MED FILL — METOPROLOL SUCC ER 200 MG T: 200 | 90 days supply | Qty: 90 | Fill #1

## 2017-08-17 ENCOUNTER — Other Ambulatory Visit: Payer: Self-pay | Admitting: Family Medicine

## 2017-08-17 MED FILL — ELIQUIS 5 MG TABLET: 5 | 30 days supply | Qty: 60 | Fill #8

## 2017-08-17 MED FILL — FLUTICASONE PROP 50 MCG SPR: 50 | 30 days supply | Qty: 16 | Fill #0

## 2017-09-14 MED FILL — LISINOPRIL 5 MG TAB: 5 | 90 days supply | Qty: 90 | Fill #1

## 2017-09-14 MED FILL — ELIQUIS 5 MG TABLET: 5 | 30 days supply | Qty: 60 | Fill #9

## 2017-09-14 MED FILL — ATORVASTATIN 10 MG TABLET: 10 | 90 days supply | Qty: 45 | Fill #1

## 2017-09-28 MED FILL — FLUTICASONE PROP 50 MCG SPR: 50 | 30 days supply | Qty: 16 | Fill #1

## 2017-10-19 MED FILL — METOPROLOL SUCC ER 200 MG T: 200 | 90 days supply | Qty: 90 | Fill #2

## 2017-10-19 MED FILL — ELIQUIS 5 MG TABLET: 5 | 30 days supply | Qty: 60 | Fill #10

## 2017-10-26 MED FILL — VERAPAMIL ER 120 MG TABLET: 120 | 90 days supply | Qty: 90 | Fill #2

## 2017-11-10 MED FILL — FLUTICASONE PROP 50 MCG SPR: 50 | 30 days supply | Qty: 16 | Fill #2

## 2017-11-16 MED FILL — ELIQUIS 5 MG TABLET: 5 | 30 days supply | Qty: 60 | Fill #11

## 2017-12-07 ENCOUNTER — Other Ambulatory Visit: Payer: Self-pay | Admitting: Internal Medicine

## 2017-12-07 MED FILL — LISINOPRIL 5 MG TAB: 5 | 30 days supply | Qty: 30 | Fill #0

## 2017-12-07 MED FILL — ATORVASTATIN 10 MG TABLET: 10 | 90 days supply | Qty: 45 | Fill #2

## 2017-12-14 ENCOUNTER — Other Ambulatory Visit: Payer: Self-pay | Admitting: Family Medicine

## 2017-12-14 ENCOUNTER — Encounter: Payer: 59 | Admitting: Family Medicine

## 2017-12-14 MED FILL — FLUTICASONE PROP 50 MCG SPR: 50 | 30 days supply | Qty: 16 | Fill #3

## 2017-12-14 MED FILL — ELIQUIS 5 MG TABLET: 5 | 30 days supply | Qty: 60 | Fill #0

## 2018-01-10 ENCOUNTER — Other Ambulatory Visit: Payer: Self-pay | Admitting: Internal Medicine

## 2018-01-11 MED FILL — LISINOPRIL 5 MG TABLET: 5 | 30 days supply | Qty: 30 | Fill #0

## 2018-01-17 ENCOUNTER — Other Ambulatory Visit: Payer: Self-pay | Admitting: Internal Medicine

## 2018-01-17 MED FILL — ELIQUIS 5 MG TABLET: 5 | 30 days supply | Qty: 60 | Fill #1

## 2018-01-18 ENCOUNTER — Telehealth: Payer: Self-pay | Admitting: Family Medicine

## 2018-01-18 MED ORDER — METOPROLOL SUCCINATE ER 200 MG PO TB24: ORAL_TABLET | ORAL | 0 refills | Status: DC

## 2018-01-18 MED FILL — METOPROLOL SUCCINATE ER 200: 200 | 90 days supply | Qty: 90 | Fill #0

## 2018-01-18 NOTE — Telephone Encounter (Signed)
Patient called and made aware the prescription Metoprolol was sent by his cardiologist, Dr. Lovena Le on 01/17/18, it said it was printed, so it may have been faxed and didn't go through. I advised him to call that office to ask them to resend, he verbalized understanding.

## 2018-01-18 NOTE — Telephone Encounter (Signed)
See note.   Copied from [place] [id number]. Topic: Inquiry >> Jan 18, 2018  3:57 PM Pricilla Handler wrote: Reason for CRM: Patient called requesting a refill of Metoprolol (TOPROL-XL) 200 MG 24 hr tablet. Patient contacted the pharmacy. Patient only has 3 pills left. Patient's preferred pharmacy is [place], [place] - [address] [phone] (Phone)  [phone] (Fax). Patient can be reached at [phone].         Thank You!!!

## 2018-01-18 NOTE — Telephone Encounter (Signed)
Copied from [place] [id number]. Topic: Inquiry >> Jan 18, 2018  3:57 PM Pricilla Handler wrote: Reason for CRM: Patient called requesting a refill of Metoprolol (TOPROL-XL) 200 MG 24 hr tablet. Patient contacted the pharmacy. Patient only has 3 pills left. Patient's preferred pharmacy is [place], [place] - [address] [phone] (Phone)  [phone] (Fax). Patient can be reached at [phone].         Thank You!!!

## 2018-01-18 NOTE — Addendum Note (Signed)
Addended by: Juventino Slovak on: 01/18/2018 05:36 PM   Modules accepted: Orders

## 2018-01-19 NOTE — Telephone Encounter (Signed)
This was filled today by Evans Lance, MD.

## 2018-01-25 MED FILL — FLUTICASONE PROP 50 MCG SPR: 50 | 30 days supply | Qty: 16 | Fill #4

## 2018-02-01 ENCOUNTER — Other Ambulatory Visit: Payer: Self-pay | Admitting: Internal Medicine

## 2018-02-01 MED FILL — VERAPAMIL ER 120 MG TABLET: 120 | 90 days supply | Qty: 90 | Fill #0

## 2018-02-03 ENCOUNTER — Other Ambulatory Visit: Payer: Self-pay | Admitting: Internal Medicine

## 2018-02-03 MED ORDER — LISINOPRIL 5 MG PO TABS: 5.0000 mg | ORAL_TABLET | Freq: Every day | ORAL | 0 refills | Status: DC

## 2018-02-06 MED FILL — LISINOPRIL 5 MG TABLET: 5 | 30 days supply | Qty: 30 | Fill #0

## 2018-02-15 MED FILL — ELIQUIS 5 MG TABLET: 5 | 30 days supply | Qty: 60 | Fill #2

## 2018-03-01 ENCOUNTER — Ambulatory Visit (INDEPENDENT_AMBULATORY_CARE_PROVIDER_SITE_OTHER): Payer: 59 | Admitting: Family Medicine

## 2018-03-01 ENCOUNTER — Telehealth: Payer: Self-pay

## 2018-03-01 ENCOUNTER — Encounter: Payer: Self-pay | Admitting: Family Medicine

## 2018-03-01 ENCOUNTER — Telehealth: Payer: Self-pay | Admitting: *Deleted

## 2018-03-01 ENCOUNTER — Other Ambulatory Visit: Payer: Self-pay | Admitting: Family Medicine

## 2018-03-01 ENCOUNTER — Other Ambulatory Visit: Payer: Self-pay

## 2018-03-01 VITALS — BP 108/78 | HR 111 | Temp 98.2°F | Ht 76.0 in | Wt 282.4 lb

## 2018-03-01 DIAGNOSIS — D72829 Elevated white blood cell count, unspecified: Secondary | ICD-10-CM

## 2018-03-01 DIAGNOSIS — I481 Persistent atrial fibrillation: Secondary | ICD-10-CM

## 2018-03-01 DIAGNOSIS — Z0001 Encounter for general adult medical examination with abnormal findings: Secondary | ICD-10-CM | POA: Diagnosis not present

## 2018-03-01 DIAGNOSIS — R000 Tachycardia, unspecified: Secondary | ICD-10-CM | POA: Diagnosis not present

## 2018-03-01 DIAGNOSIS — I428 Other cardiomyopathies: Secondary | ICD-10-CM

## 2018-03-01 DIAGNOSIS — Z125 Encounter for screening for malignant neoplasm of prostate: Secondary | ICD-10-CM | POA: Diagnosis not present

## 2018-03-01 DIAGNOSIS — E785 Hyperlipidemia, unspecified: Secondary | ICD-10-CM

## 2018-03-01 DIAGNOSIS — R739 Hyperglycemia, unspecified: Secondary | ICD-10-CM

## 2018-03-01 DIAGNOSIS — I4819 Other persistent atrial fibrillation: Secondary | ICD-10-CM

## 2018-03-01 LAB — LIPID PANEL
CHOL/HDL RATIO: 4
Cholesterol: 119 mg/dL (ref 0–200)
HDL: 33.1 mg/dL — AB (ref >39.00)
LDL CALC: 68 mg/dL (ref 0–99)
NonHDL: 85.82
TRIGLYCERIDES: 90 mg/dL (ref 0.0–149.0)
VLDL: 18 mg/dL (ref 0.0–40.0)

## 2018-03-01 LAB — POC URINALSYSI DIPSTICK (AUTOMATED)
Bilirubin, UA: NEGATIVE
Blood, UA: NEGATIVE
Glucose, UA: NEGATIVE
KETONES UA: NEGATIVE
Leukocytes, UA: NEGATIVE
NITRITE UA: NEGATIVE
PH UA: 6 (ref 5.0–8.0)
PROTEIN UA: NEGATIVE
Spec Grav, UA: 1.02 (ref 1.010–1.025)
UROBILINOGEN UA: 0.2 U/dL

## 2018-03-01 LAB — CBC
HCT: 38.2 % — ABNORMAL LOW (ref 39.0–52.0)
HEMOGLOBIN: 12.1 g/dL — AB (ref 13.0–17.0)
MCHC: 31.8 g/dL (ref 30.0–36.0)
MCV: 90.1 fl (ref 78.0–100.0)
Platelets: 572 10*3/uL — ABNORMAL HIGH (ref 150.0–400.0)
RBC: 4.23 Mil/uL (ref 4.22–5.81)
RDW: 19 % — AB (ref 11.5–15.5)
WBC: 208 10*3/uL (ref 4.0–10.5)

## 2018-03-01 LAB — PSA: PSA: 0.13 ng/mL (ref 0.10–4.00)

## 2018-03-01 LAB — COMPREHENSIVE METABOLIC PANEL
ALT: 21 U/L (ref 0–53)
AST: 25 U/L (ref 0–37)
Albumin: 4.1 g/dL (ref 3.5–5.2)
Alkaline Phosphatase: 112 U/L (ref 39–117)
BILIRUBIN TOTAL: 1 mg/dL (ref 0.2–1.2)
BUN: 25 mg/dL — ABNORMAL HIGH (ref 6–23)
CALCIUM: 9.7 mg/dL (ref 8.4–10.5)
CO2: 29 meq/L (ref 19–32)
Chloride: 102 mEq/L (ref 96–112)
Creatinine, Ser: 1.22 mg/dL (ref 0.40–1.50)
GFR: 63.69 mL/min (ref >60.00)
GLUCOSE: 86 mg/dL (ref 70–99)
Potassium: 4.3 mEq/L (ref 3.5–5.1)
Sodium: 143 mEq/L (ref 135–145)
Total Protein: 6.8 g/dL (ref 6.0–8.3)

## 2018-03-01 LAB — TSH: TSH: 2.84 u[IU]/mL (ref 0.35–4.50)

## 2018-03-01 LAB — HEMOGLOBIN A1C: Hgb A1c MFr Bld: 6.7 % — ABNORMAL HIGH (ref 4.6–6.5)

## 2018-03-01 MED ORDER — FUROSEMIDE 20 MG PO TABS: 20.0000 mg | ORAL_TABLET | Freq: Every day | ORAL | 3 refills | Status: DC

## 2018-03-01 MED ORDER — LISINOPRIL 5 MG PO TABS: 5.0000 mg | ORAL_TABLET | Freq: Every day | ORAL | 11 refills | Status: DC

## 2018-03-01 MED FILL — FUROSEMIDE 20 MG TABS: 20 | 30 days supply | Qty: 30 | Fill #0

## 2018-03-01 NOTE — Progress Notes (Signed)
Stat lab report of WBC > 208K. Added smear. Unable to add differential. Made appointment for patient to see PCP tomorrow morning. Referral added for Heme/Onc. Will call to make STAT appointment now. Will notify PCP and hopefully, all labs will be available in the morning.  Briscoe Deutscher DO

## 2018-03-01 NOTE — Assessment & Plan Note (Signed)
Cardiomyopathy- echo showed EF 30-35% in 02/2015- on lisinopril 5mg  and metoprolol 200mg  XR. Has visit with Dr. Lovena Le scheduled in 2 weeks. Over last 6 months- noted some edema in his legs. Also getting some skin changes related to this with dry skin and even tearing at times. Does have some shortness of breath with exertion- about halfway through cutting grass for an hour has to take a break due to this . - echo - we discussed updating but he is so close to his visit with Dr. Lovena Le that he will discuss with him.  - we also discussed with shortness of breath and increased edema possible trial of lasix- once again he wants to chat with Dr. Lovena Le initially but after further thought he agrees to lasix 20mg  daily. He is on lisinopril so hopefully potassium will be ok- I will see if cardiology can repeat in 2 weeks- if not he can come back here - will update Dr. Lovena Le

## 2018-03-01 NOTE — Progress Notes (Signed)
Thanks so much Dr. Juleen China. I will await full labs- wonder about lab error- likely repeat tomorrow

## 2018-03-01 NOTE — Addendum Note (Signed)
Addended by: Kayren Eaves T on: 03/01/2018 11:06 AM   Modules accepted: Orders

## 2018-03-01 NOTE — Telephone Encounter (Signed)
[facility] called with critical result on pt. WBC 208,000.  Verbally informed Dr. Juleen China of result since Dr. Yong Channel was out of the office.   Per Dr. Juleen China, ordered pathologist smear review to [facility].

## 2018-03-01 NOTE — Patient Instructions (Addendum)
Please stop by lab before you go  To Dr. Lovena Le  "Patient with nonischemic cardiomyopathy with last EF of 35%. Has 2+ edema, recent weight gain, shortness of breath over last 6 months. I was starting him on lasix 20mg  daily. He is going to see you in 2 weeks. Would you mind updating a bmet when you see him? Also we considered updating echo but he wants to talk with you. "  Keeping up with dentist and eye doctor right?

## 2018-03-01 NOTE — Addendum Note (Signed)
Addended by: Frutoso Chase A on: 03/01/2018 03:41 PM   Modules accepted: Orders

## 2018-03-01 NOTE — Assessment & Plan Note (Signed)
A fib-persistent- on verapamil and metoprolol for rate control. Also on eliquis BID at 5mg . At rest he has noted HR around 65. No changes today in meds- he reports some anxiety in physician offices.

## 2018-03-01 NOTE — Progress Notes (Signed)
Phone: [phone]  Subjective:  Patient presents today for their annual physical. Chief complaint-noted.   See problem oriented charting- ROS- full  review of systems was completed and negative except for: edema, runny nose, cough, shortness of breat, leg swelling, knee pain, joint swelling, skin changes lower legs, food allergy to eggs  The following were reviewed and entered/updated in epic: Past Medical History:  Diagnosis Date  . Atrial fibrillation ([place])   . Diverticulosis   . Hx of adenomatous colonic polyps   . Hyperlipidemia   . Hypertension   . Vitreous anomalies 2014   Tack down of vitreous tear   Patient Active Problem List   Diagnosis Date Noted  . NICM (nonischemic cardiomyopathy) ([place]) 04/02/2015    Priority: Medium  . Atrial fibrillation ([place]) 09/11/2014    Priority: Medium  . Hyperlipidemia 06/23/2007    Priority: Medium  . Hx of adenomatous colonic polyps     Priority: Low  . Obesity (BMI 30-39.9) 09/19/2013    Priority: Low  . H/O calcium pyrophosphate deposition disease (CPPD) 10/16/2009    Priority: Low  . Hyperglycemia 12/08/2016   Past Surgical History:  Procedure Laterality Date  . CARDIAC CATHETERIZATION N/A 04/30/2015   Procedure: Left Heart Cath and Coronary Angiography;  Surgeon: Jerline Pain, MD;  Location: [facility];  Service: Cardiovascular;  Laterality: N/A;  . CARDIOVERSION N/A 11/20/2014   Procedure: CARDIOVERSION;  Surgeon: Pixie Casino, MD;  Location: [facility] ENDOSCOPY;  Service: Cardiovascular;  Laterality: N/A;  11:38 Etomidate given IV for unsuccessful synched cardioversion @ 150 joules from Afib, 11:39 repeated synched cardioversion @ 200 joules successfully from Afib to SR. 12 lead EKG ordered to confirm..  . COLONOSCOPY    . COLONOSCOPY N/A 12/24/2015   Procedure: COLONOSCOPY ( ANAPHALAXIS ALLERGY TO EGGS);  Surgeon: Gatha Mayer, MD;  Location: [facility];  Service: Endoscopy;  Laterality: N/A;  anaphalaxis allergy to  eggs  . EYE SURGERY  2014    Family History  Problem Relation Age of Onset  . Asthma Mother   . Alzheimer's disease Mother        diagnosed 13  . Brain cancer Father        not genetic  . Other Brother        gilbert disease  . Heart attack Maternal Grandmother   . Heart attack Maternal Uncle   . Colon cancer Neg Hx   . Esophageal cancer Neg Hx   . Pancreatic cancer Neg Hx   . Liver disease Neg Hx     Medications- reviewed and updated Current Outpatient Medications  Medication Sig Dispense Refill  . atorvastatin (LIPITOR) 10 MG tablet Take 1 tablet by mouth every other day. 45 tablet 2  . ELIQUIS 5 MG TABS tablet TAKE 1 TABLET BY MOUTH 2 TIMES DAILY. 60 tablet 11  . fluticasone (FLONASE) 50 MCG/ACT nasal spray INSTILL 2 SPRAYS INTO BOTH NOSTRILS DAILY 16 g 6  . furosemide (LASIX) 20 MG tablet Take 1 tablet (20 mg total) by mouth daily. 30 tablet 3  . lisinopril (PRINIVIL,ZESTRIL) 5 MG tablet Take 1 tablet (5 mg total) by mouth daily. Please keep upcoming appt for future refills. Thank you 30 tablet 11  . metoprolol (TOPROL-XL) 200 MG 24 hr tablet Take 1 tablet by mouth daily with or immediately following a meal. Please keep upcoming appt 5/29 at 9:45 am for additional refills thanks. 90 tablet 0  . verapamil (CALAN-SR) 120 MG CR tablet Take 1 tablet (120  mg total) by mouth at bedtime. Please keep 5/29 appointment for additional refills thanks. 90 tablet 0   No current facility-administered medications for this visit.     Allergies-reviewed and updated Allergies  Allergen Reactions  . Eggs Or Egg-Derived Products Anaphylaxis    REACTION: no vaccines    Social History   Social History Narrative   Married 1977. 2 children (son in doctorate program at [employer] nearing end and daughter with [employer] in [place]). No grandkids   [employer] [place]   [employer] for [employer].       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 3 dogs, 1 cat, 1 snake.      Hobbies: fishing on reservoirs in [place], family time, nature, hiking    Objective: BP 108/78 (BP Location: Left Arm, Patient Position: Sitting, Cuff Size: Normal)   Pulse (!) 111   Temp 98.2 F (36.8 C) (Oral)   Ht 6\' 4"  (1.93 m)   Wt 282 lb 6.4 oz (128.1 kg)   SpO2 95%   BMI 34.37 kg/m  Gen: NAD, resting comfortably HEENT: Mucous membranes are moist. Oropharynx normal Neck: no thyromegaly CV: RRR no murmurs rubs or gallops Lungs: CTAB no crackles, wheeze, rhonchi Abdomen: soft/nontender/nondistended/normal bowel sounds. No rebound or guarding.  Ext: 2+ edema with venous stasis leg changes- erythema and dryness Skin: warm, dry Neuro: grossly normal, moves all extremities, PERRLA Rectal: normal tone, diffusely enlarged prostate, no masses or tenderness  Assessment/Plan:  62 y.o. male presenting for annual physical.  Health Maintenance counseling: 1. Anticipatory guidance: Patient counseled regarding regular dental exams -q6 months, eye exams -yearly, wearing seatbelts.  2. Risk factor reduction:  Advised patient of need for regular exercise and diet rich and fruits and vegetables to reduce risk of heart attack and stroke. Exercise- has really dropped off- we discussed importance, at least he is mowing the lawn- he is slightly hesitant to start on exercise utnil cardiac evaluation. Diet-Wife is dietician- he continues to struggle with weight loss. He knows he needs to cut portions and cut down on sweet tooth Wt Readings from Last 3 Encounters:  03/01/18 282 lb 6.4 oz (128.1 kg)  01/12/17 277 lb (125.6 kg)  12/08/16 279 lb (126.6 kg)  3. Immunizations/screenings/ancillary studies-discussed shingrix. We discussed due for TDap in December- he wants to wait for next CPE Immunization History  Administered Date(s) Administered  . Td 02/16/2000, 10/03/2008  4. Prostate cancer screening- low risk PSA trend. With low psa still some BPH on exam and ntoes  nocturia 1-2x a night.   Lab Results  Component Value Date   PSA 0.21 12/01/2016   PSA 0.14 11/12/2015   PSA 0.14 09/04/2014   5. Colon cancer screening - 2017 colonoscopy with 5 year repeat due to adenomas 6. Skin cancer screening- no dermatologist. He uses protective clothing. Denies worrisome, changing, or new skin lesions.  7. Never smoker  Status of chronic or acute concerns   Dealing with allergies- post nasal drip, runny nose, cough  During February various joint pains- seemed to be shifting at that time. Better starting in march  NICM (nonischemic cardiomyopathy) ([place]) Cardiomyopathy- echo showed EF 30-35% in 02/2015- on lisinopril 5mg  and metoprolol 200mg  XR. Has visit with Dr. Lovena Le scheduled in 2 weeks. Over last 6 months- noted some edema in his legs. Also getting some skin changes related to this with dry skin and even tearing at times. Does have some  shortness of breath with exertion- about halfway through cutting grass for an hour has to take a break due to this . - echo - we discussed updating but he is so close to his visit with Dr. Lovena Le that he will discuss with him.  - we also discussed with shortness of breath and increased edema possible trial of lasix- once again he wants to chat with Dr. Lovena Le initially but after further thought he agrees to lasix 20mg  daily. He is on lisinopril so hopefully potassium will be ok- I will see if cardiology can repeat in 2 weeks- if not he can come back here - will update Dr. Lovena Le  Atrial fibrillation [facility]) A fib-persistent- on verapamil and metoprolol for rate control. Also on eliquis BID at 5mg . At rest he has noted HR around 65. No changes today in meds- he reports some anxiety in physician offices.   Hyperlipidemia Hyperlipidemia- remains on atorvastatin 10mg  every other day (reduced last year to reduce risk of progression to diabetes)- update lipids  Hyperglycemia Hyperglycemia- update cbg and a1c today   Future  Appointments  Date Time Provider [place]  03/15/2018  9:45 AM Evans Lance, MD [facility]   No follow-ups on file.  Lab/Order associations: Preventative health care - Plan: CBC, Comprehensive metabolic panel, Lipid panel, PSA, Hemoglobin A1c, TSH  Persistent atrial fibrillation (HCC)  Hyperlipidemia, unspecified hyperlipidemia type - Plan: CBC, Comprehensive metabolic panel, Lipid panel, POCT Urinalysis Dipstick (Automated)  NICM (nonischemic cardiomyopathy) (HCC)  Screening for prostate cancer - Plan: PSA  Hyperglycemia - Plan: Hemoglobin A1c  Tachycardia - Plan: TSH  Meds ordered this encounter  Medications  . lisinopril (PRINIVIL,ZESTRIL) 5 MG tablet    Sig: Take 1 tablet (5 mg total) by mouth daily. Please keep upcoming appt for future refills. Thank you    Dispense:  30 tablet    Refill:  11  . furosemide (LASIX) 20 MG tablet    Sig: Take 1 tablet (20 mg total) by mouth daily.    Dispense:  30 tablet    Refill:  3    Return precautions advised.  Garret Reddish, MD

## 2018-03-01 NOTE — Assessment & Plan Note (Signed)
Hyperlipidemia- remains on atorvastatin 10mg  every other day (reduced last year to reduce risk of progression to diabetes)- update lipids

## 2018-03-01 NOTE — Telephone Encounter (Signed)
After Dr Juleen China reviewed patients labs I called patient and informed him of his White Cell Count. I asked him to schedule an appointment with Dr Yong Channel for tomorrow. He has work obligations but will make a few calls and then call back to schedule. Please schedule him in a Same Day slot for tomorrow.

## 2018-03-01 NOTE — Assessment & Plan Note (Signed)
Hyperglycemia- update cbg and a1c today

## 2018-03-01 NOTE — Addendum Note (Signed)
Addended by: Frutoso Chase A on: 03/01/2018 03:34 PM   Modules accepted: Orders

## 2018-03-01 NOTE — Progress Notes (Signed)
See you tomorrow Luckily it looks like our team has been able to get you into the hematology/oncology clinic- with Dr. Burr Medico- I want to thank Dr. Juleen China for ordering this stat when a critical lab came into clinic. Your platelets are slightly high. Your are slightly anemic.   I am forwarding this to Dr. Burr Medico to keep her in the loop. We will repeat a CBC tomorrow and add differential tomorrow as well as get a pathologist smear review in preparation for meeting with her.   Your a1c suggests you have progressed to diabetes even though your fasting sugar is ok- since there is some concern about your blood- I want to hold off on diagnosing this as diabetes until things have been evaluated from that perspective and potentially repeat a1c months down the road.   Urine normal Kidney and liver largely normal- other than appearing to be on dehydrated side (the confusing thing here is I actually want to dehydrate you more with the lasix) Lipids look great Thyroid normal psa low risk for prostate cancer

## 2018-03-02 ENCOUNTER — Ambulatory Visit (INDEPENDENT_AMBULATORY_CARE_PROVIDER_SITE_OTHER): Payer: 59 | Admitting: Family Medicine

## 2018-03-02 ENCOUNTER — Encounter: Payer: Self-pay | Admitting: Family Medicine

## 2018-03-02 VITALS — BP 100/66 | HR 101 | Temp 98.3°F | Ht 76.0 in | Wt 284.2 lb

## 2018-03-02 DIAGNOSIS — D72829 Elevated white blood cell count, unspecified: Secondary | ICD-10-CM

## 2018-03-02 DIAGNOSIS — I428 Other cardiomyopathies: Secondary | ICD-10-CM

## 2018-03-02 LAB — PATHOLOGIST SMEAR REVIEW

## 2018-03-02 MED FILL — LISINOPRIL 5 MG TABLET: 5 | 30 days supply | Qty: 30 | Fill #0

## 2018-03-02 NOTE — Patient Instructions (Signed)
Please stop by lab before you leave

## 2018-03-02 NOTE — Progress Notes (Signed)
Pathologist

## 2018-03-02 NOTE — Progress Notes (Signed)
Subjective:  [name] Aguilar is a 62 y.o. year old very pleasant male patient who presents for/with See problem oriented charting ROS- admits to fatigue and shortness of breath. No chest pain reported. No increased edema. Some drainage from back of right leg without surrounding expanding erythema or warmth. No fever. No night sweats   Past Medical History-  Patient Active Problem List   Diagnosis Date Noted  . NICM (nonischemic cardiomyopathy) ([place]) 04/02/2015    Priority: Medium  . Atrial fibrillation ([place]) 09/11/2014    Priority: Medium  . Hyperlipidemia 06/23/2007    Priority: Medium  . Hx of adenomatous colonic polyps     Priority: Low  . Obesity (BMI 30-39.9) 09/19/2013    Priority: Low  . H/O calcium pyrophosphate deposition disease (CPPD) 10/16/2009    Priority: Low  . Leukocytosis 03/01/2018  . Hyperglycemia 12/08/2016    Medications- reviewed and updated Current Outpatient Medications  Medication Sig Dispense Refill  . atorvastatin (LIPITOR) 10 MG tablet Take 1 tablet by mouth every other day. 45 tablet 2  . ELIQUIS 5 MG TABS tablet TAKE 1 TABLET BY MOUTH 2 TIMES DAILY. 60 tablet 11  . fluticasone (FLONASE) 50 MCG/ACT nasal spray INSTILL 2 SPRAYS INTO BOTH NOSTRILS DAILY 16 g 6  . furosemide (LASIX) 20 MG tablet Take 1 tablet (20 mg total) by mouth daily. 30 tablet 3  . lisinopril (PRINIVIL,ZESTRIL) 5 MG tablet Take 1 tablet (5 mg total) by mouth daily. Please keep upcoming appt for future refills. Thank you 30 tablet 11  . metoprolol (TOPROL-XL) 200 MG 24 hr tablet Take 1 tablet by mouth daily with or immediately following a meal. Please keep upcoming appt 5/29 at 9:45 am for additional refills thanks. 90 tablet 0  . verapamil (CALAN-SR) 120 MG CR tablet Take 1 tablet (120 mg total) by mouth at bedtime. Please keep 5/29 appointment for additional refills thanks. 90 tablet 0   No current facility-administered medications for this visit.     Objective: BP 100/66 (BP  Location: Left Arm, Patient Position: Sitting, Cuff Size: Large)   Pulse (!) 101   Temp 98.3 F (36.8 C) (Oral)   Ht 6\' 4"  (1.93 m)   Wt 284 lb 3.2 oz (128.9 kg)   SpO2 94%   BMI 34.59 kg/m  Gen: NAD, resting comfortably No inguinal, axillary, cervical, supraclavicular lymphadenopathy Ext: 2+ edema- drainage on back of right right leg  Assessment/Plan:  Leukocytosis S: I was very up front with [name] Aguilar that I was worried about leukemia based on labs from yesterday. He has felt run down and short of breath over last few months. Denies lymph node enlargement. Denies night sweats. No fever or chills. He denies new pains. Denies worsening redness around drainage from back of right leg (which has started since his edema has progressed) A/P: We reviewed importance of hematology oncology follow up which is already scheduled for the 28th with Dr. Burr Medico. . I was honest I had never seen a WBC level that high- my hope is that this was lab error (though also has anemia and high platelets so this may be real and indicative of potential leukemia)- so will recheck CBC with diff today and add pathologist smear review.   NICM (nonischemic cardiomyopathy) ([place]) S: patient started lasix this am A/P: see note yesterday- hope is that diuresis will help with his edema and has cardiology follow up  On 29th  We also reviewed all of his labs from yesterday as he had  not had a chance to see them on my chart.  Future Appointments  Date Time Provider [place]  03/14/2018  2:30 PM Truitt Merle, MD [facility] None  03/15/2018  9:45 AM Evans Lance, MD [facility]   Lab/Order associations: Leukocytosis, unspecified type - Plan: CBC (INCLUDES DIFF/PLT) WITH PATHOLOGIST REVIEW, CANCELED: CBC w/Diff, CANCELED: Pathologist smear review, CANCELED: CBC w/Diff  Return precautions advised.  Garret Reddish, MD

## 2018-03-02 NOTE — Assessment & Plan Note (Signed)
S: patient started lasix this am A/P: see note yesterday- hope is that diuresis will help with his edema and has cardiology follow up  On 29th

## 2018-03-02 NOTE — Telephone Encounter (Signed)
FYI addressed yesterday afternoon

## 2018-03-02 NOTE — Addendum Note (Signed)
Addended by: Kayren Eaves T on: 03/02/2018 04:03 PM   Modules accepted: Orders

## 2018-03-03 ENCOUNTER — Telehealth: Payer: Self-pay | Admitting: Hematology

## 2018-03-03 ENCOUNTER — Encounter: Payer: Self-pay | Admitting: Hematology

## 2018-03-03 ENCOUNTER — Other Ambulatory Visit: Payer: Self-pay | Admitting: Hematology

## 2018-03-03 ENCOUNTER — Inpatient Hospital Stay: Payer: 59

## 2018-03-03 ENCOUNTER — Ambulatory Visit: Payer: 59

## 2018-03-03 ENCOUNTER — Inpatient Hospital Stay: Payer: 59 | Attending: Hematology | Admitting: Hematology

## 2018-03-03 DIAGNOSIS — Z7901 Long term (current) use of anticoagulants: Secondary | ICD-10-CM | POA: Insufficient documentation

## 2018-03-03 DIAGNOSIS — D72829 Elevated white blood cell count, unspecified: Secondary | ICD-10-CM | POA: Diagnosis not present

## 2018-03-03 DIAGNOSIS — C9210 Chronic myeloid leukemia, BCR/ABL-positive, not having achieved remission: Secondary | ICD-10-CM

## 2018-03-03 DIAGNOSIS — I4891 Unspecified atrial fibrillation: Secondary | ICD-10-CM | POA: Insufficient documentation

## 2018-03-03 DIAGNOSIS — I110 Hypertensive heart disease with heart failure: Secondary | ICD-10-CM | POA: Insufficient documentation

## 2018-03-03 DIAGNOSIS — I509 Heart failure, unspecified: Secondary | ICD-10-CM | POA: Diagnosis not present

## 2018-03-03 DIAGNOSIS — E883 Tumor lysis syndrome: Secondary | ICD-10-CM | POA: Diagnosis not present

## 2018-03-03 DIAGNOSIS — R0602 Shortness of breath: Secondary | ICD-10-CM | POA: Insufficient documentation

## 2018-03-03 LAB — CBC WITH DIFFERENTIAL (CANCER CENTER ONLY)
BAND NEUTROPHILS: 22 %
BASOS PCT: 1 %
Basophils Absolute: 2.1 10*3/uL — ABNORMAL HIGH (ref 0.0–0.1)
Blasts: 1 %
EOS ABS: 4.2 10*3/uL — AB (ref 0.0–0.5)
EOS PCT: 2 %
HCT: 35.7 % — ABNORMAL LOW (ref 38.4–49.9)
Hemoglobin: 11.2 g/dL — ABNORMAL LOW (ref 13.0–17.1)
LYMPHS ABS: 4.2 10*3/uL — AB (ref 0.9–3.3)
LYMPHS PCT: 2 %
MCH: 27.8 pg (ref 27.2–33.4)
MCHC: 31.4 g/dL — AB (ref 32.0–36.0)
MCV: 88.4 fL (ref 79.3–98.0)
MYELOCYTES: 10 %
Metamyelocytes Relative: 14 %
Monocytes Absolute: 2.1 10*3/uL — ABNORMAL HIGH (ref 0.1–0.9)
Monocytes Relative: 1 %
NEUTROS PCT: 44 %
NRBC: 1 /100{WBCs} — AB
Neutro Abs: 197 10*3/uL — ABNORMAL HIGH (ref 1.5–6.5)
OTHER: 0 %
PLATELETS: 549 10*3/uL — AB (ref 140–400)
Promyelocytes Relative: 3 %
RBC: 4.04 MIL/uL — ABNORMAL LOW (ref 4.20–5.82)
RDW: 18.1 % — ABNORMAL HIGH (ref 11.0–14.6)
WBC Count: 211.8 10*3/uL (ref 4.0–10.3)

## 2018-03-03 LAB — CBC WITH DIFFERENTIAL/PLATELET
BASOS ABS: 21307 {cells}/uL — AB (ref 0–200)
Basophils Relative: 9.2 %
EOS PCT: 0 %
Eosinophils Absolute: 0 cells/uL — ABNORMAL LOW (ref 15–500)
HCT: 34 % — ABNORMAL LOW (ref 38.5–50.0)
HEMOGLOBIN: 10.5 g/dL — AB (ref 13.2–17.1)
LYMPHS ABS: 4169 {cells}/uL — AB (ref 850–3900)
MCH: 28 pg (ref 27.0–33.0)
MCHC: 30.9 g/dL — AB (ref 32.0–36.0)
MCV: 90.7 fL (ref 80.0–100.0)
MPV: 11.5 fL (ref 7.5–12.5)
Monocytes Relative: 2.6 %
NEUTROS ABS: 200102 {cells}/uL — AB (ref 1500–7800)
NEUTROS PCT: 86.4 %
Platelets: 576 10*3/uL — ABNORMAL HIGH (ref 140–400)
RBC: 3.75 10*6/uL — ABNORMAL LOW (ref 4.20–5.80)
RDW: 16.5 % — ABNORMAL HIGH (ref 11.0–15.0)
Total Lymphocyte: 1.8 %
WBC mixed population: 6022 cells/uL — ABNORMAL HIGH (ref 200–950)
WBC: 231.6 10*3/uL — ABNORMAL HIGH (ref 3.8–10.8)

## 2018-03-03 LAB — LACTATE DEHYDROGENASE: LDH: 767 U/L — ABNORMAL HIGH (ref 125–245)

## 2018-03-03 LAB — SAVE SMEAR

## 2018-03-03 LAB — URIC ACID: Uric Acid, Serum: 9.9 mg/dL — ABNORMAL HIGH (ref 2.6–7.4)

## 2018-03-03 MED ORDER — HYDROXYUREA 500 MG PO CAPS: 500.0000 mg | ORAL_CAPSULE | Freq: Every day | ORAL | 0 refills | Status: DC

## 2018-03-03 MED ORDER — ALLOPURINOL 300 MG PO TABS: 300.0000 mg | ORAL_TABLET | Freq: Every day | ORAL | 0 refills | Status: DC

## 2018-03-03 MED FILL — ALLOPURINOL 300 MG TABS: 300 | 60 days supply | Qty: 60 | Fill #0

## 2018-03-03 MED FILL — HYDROXYUREA 500 MG CAPSULE: 500 | 40 days supply | Qty: 40 | Fill #0

## 2018-03-03 NOTE — Telephone Encounter (Signed)
Tc to the pt to move his appt to see Dr. Burr Medico today. Unable to reach the pt and lft the pt a vm with appt date and time for today.

## 2018-03-03 NOTE — Progress Notes (Signed)
[place]  [phone] Fax:([phone]) [place] consult Note   Patient Care Team: Marin Olp, MD as PCP - General (Family Medicine)   Date of Service:  03/03/2018   Referring physician: Marin Olp, MD   CHIEF COMPLAINTS/PURPOSE OF CONSULTATION:  Severe leukocytosis  HISTORY OF PRESENTING ILLNESS:  03/03/18  [name] Aguilar 62 y.o. male is a here because of Leukocytosis. The patient was referred by his PCP Dr. Yong Channel. The patient presents to the clinic today by himself.  He has been under treatment for Afib with medication for 2 years now. In 11-09/2017 he started getting pitting edema in his legs. He planned to see his PCP sooner but finally saw him 2 days ago. His cardiologist is Dr. Janit Pagan and saw him last in 12/2017. He plans to see him back this month. Pt notes swelling with erythema and dry skin on B/l LE. He notes increased SOB with exertion. This SOB has occurred for the past 2 months.   Socially he is married with kids and works as a Animal nutritionist. He notes he had to renew his insurance for this year and had blood workup at that time early this year/late last year with no abnormal blood counts. He notes the best way to reach him is his Work phone then his home phone.  In the past the patient was diagnosed with significant comorbidities of... Pt notes he has is on medication for HTN but notes this is prophylactic given his weak heart. His past ECHO showed in 2016 showed EF at 30-35%. He denies history of blood clots. He had a cardiac cath but this was clear for blockage. His father passed form glial blastoma. His last colonoscopy was 12/24/2015 by Dr. Carlean Purl. His recent PSA was normal according to the patient. He denies history of cancer.   On review of symptoms, pt notes dry, scaly, red skin of bilateral lower extremity with old "weepiness" from skin that is now dried up. This causes itching in the skin at these locations. Pt  notes SOB upon exertion. He denies having a fever. He denies any weight loss lately.  He denies feeling any lumps, bumps or lymph nodes.   He was found to have abnormal CBC from 02/2018 He denies recent chest pain on exertion, shortness of breath on minimal exertion, pre-syncopal episodes, or palpitations. He had not noticed any recent bleeding such as epistaxis, hematuria or hematochezia The patient denies over the counter NSAID ingestion. He is on Eliquis. His last colonoscopy was 12/2015 He had no prior history or diagnosis of cancer. His age appropriate screening programs are up-to-date. He denies any pica and eats a variety of diet.   MEDICAL HISTORY:  Past Medical History:  Diagnosis Date  . Atrial fibrillation ([place])   . Diverticulosis   . Hx of adenomatous colonic polyps   . Hyperlipidemia   . Hypertension   . Vitreous anomalies 2014   Tack down of vitreous tear    SURGICAL HISTORY: Past Surgical History:  Procedure Laterality Date  . CARDIAC CATHETERIZATION N/A 04/30/2015   Procedure: Left Heart Cath and Coronary Angiography;  Surgeon: Jerline Pain, MD;  Location: [facility];  Service: Cardiovascular;  Laterality: N/A;  . CARDIOVERSION N/A 11/20/2014   Procedure: CARDIOVERSION;  Surgeon: Pixie Casino, MD;  Location: [facility];  Service: Cardiovascular;  Laterality: N/A;  11:38 Etomidate given IV for unsuccessful synched cardioversion @ 150 joules from Afib, 11:39 repeated synched cardioversion @  200 joules successfully from Afib to SR. 12 lead EKG ordered to confirm..  . COLONOSCOPY    . COLONOSCOPY N/A 12/24/2015   Procedure: COLONOSCOPY ( ANAPHALAXIS ALLERGY TO EGGS);  Surgeon: Gatha Mayer, MD;  Location: [facility];  Service: Endoscopy;  Laterality: N/A;  anaphalaxis allergy to eggs  . EYE SURGERY  2014    SOCIAL HISTORY: Social History   Socioeconomic History  . Marital status: Married    Spouse name: Not on file  . Number of children: Not on file    . Years of education: Not on file  . Highest education level: Not on file  Occupational History  . Not on file  Social Needs  . Financial resource strain: Not on file  . Food insecurity:    Worry: Not on file    Inability: Not on file  . Transportation needs:    Medical: Not on file    Non-medical: Not on file  Tobacco Use  . Smoking status: Never Smoker  . Smokeless tobacco: Never Used  Substance and Sexual Activity  . Alcohol use: Yes    Alcohol/week: 0.0 oz    Comment: 1 drink once a month max  . Drug use: No  . Sexual activity: Yes  Lifestyle  . Physical activity:    Days per week: Not on file    Minutes per session: Not on file  . Stress: Not on file  Relationships  . Social connections:    Talks on phone: Not on file    Gets together: Not on file    Attends religious service: Not on file    Active member of club or organization: Not on file    Attends meetings of clubs or organizations: Not on file    Relationship status: Not on file  . Intimate partner violence:    Fear of current or ex partner: Not on file    Emotionally abused: Not on file    Physically abused: Not on file    Forced sexual activity: Not on file  Other Topics Concern  . Not on file  Social History Narrative   Married 1977. 2 children (son in doctorate program at [employer] nearing end and daughter with [employer] in [place]). No grandkids   [employer] [place]   [employer] for [employer].       Veterinarian in town. [facility] hospital (been there for 5 years, [place] 27 years before that). 3 dogs, 1 cat, 1 snake.      Hobbies: fishing on reservoirs in [place], family time, nature, hiking    FAMILY HISTORY: Family History  Problem Relation Age of Onset  . Asthma Mother   . Alzheimer's disease Mother        diagnosed 59  . Brain cancer Father        not genetic  . Other Brother        gilbert disease  . Heart attack Maternal Grandmother   . Heart attack Maternal Uncle   . Colon  cancer Neg Hx   . Esophageal cancer Neg Hx   . Pancreatic cancer Neg Hx   . Liver disease Neg Hx     ALLERGIES:  is allergic to eggs or egg-derived products.  MEDICATIONS:  Current Outpatient Medications  Medication Sig Dispense Refill  . atorvastatin (LIPITOR) 10 MG tablet Take 1 tablet by mouth every other day. 45 tablet 2  . ELIQUIS 5 MG TABS tablet TAKE 1 TABLET BY MOUTH 2 TIMES DAILY.  60 tablet 11  . fluticasone (FLONASE) 50 MCG/ACT nasal spray INSTILL 2 SPRAYS INTO BOTH NOSTRILS DAILY 16 g 6  . furosemide (LASIX) 20 MG tablet Take 1 tablet (20 mg total) by mouth daily. 30 tablet 3  . lisinopril (PRINIVIL,ZESTRIL) 5 MG tablet Take 1 tablet (5 mg total) by mouth daily. Please keep upcoming appt for future refills. Thank you 30 tablet 11  . metoprolol (TOPROL-XL) 200 MG 24 hr tablet Take 1 tablet by mouth daily with or immediately following a meal. Please keep upcoming appt 5/29 at 9:45 am for additional refills thanks. 90 tablet 0  . verapamil (CALAN-SR) 120 MG CR tablet Take 1 tablet (120 mg total) by mouth at bedtime. Please keep 5/29 appointment for additional refills thanks. 90 tablet 0   No current facility-administered medications for this visit.     REVIEW OF SYSTEMS:   Constitutional: Denies fevers, chills or abnormal night sweats Eyes: Denies blurriness of vision, double vision or watery eyes Ears, nose, mouth, throat, and face: Denies mucositis or sore throat Respiratory: Denies cough, dyspnea or wheezes (+) SOB upon moderate exertion  Cardiovascular: Denies palpitation, chest discomfort (+) swelling of B/L lower extremity swelling Gastrointestinal:  Denies nausea, heartburn or change in bowel habits Skin: Denies abnormal skin rashes (+)  Dry, scaly, red skin of B/l LE Lymphatics: Denies new lymphadenopathy or easy bruising Neurological:Denies numbness, tingling or new weaknesses Behavioral/Psych: Mood is stable, no new changes  All other systems were reviewed with the  patient and are negative.  PHYSICAL EXAMINATION: ECOG PERFORMANCE STATUS: 1 - Symptomatic but completely ambulatory  Vitals:   03/03/18 1439  BP: 117/75  Pulse: (!) 108  Resp: 18  Temp: 99.1 F (37.3 C)  SpO2: 95%   Filed Weights   03/03/18 1439  Weight: 283 lb 12.8 oz (128.7 kg)    GENERAL:alert, no distress and comfortable SKIN: skin color, texture, turgor are normal, no rashes or significant lesions (+) Dry, scaly skin with erythema of B/l LE EYES: normal, conjunctiva are pink and non-injected, sclera clear OROPHARYNX:no exudate, no erythema and lips, buccal mucosa, and tongue normal  NECK: supple, thyroid normal size, non-tender, without nodularity LYMPH:  no palpable lymphadenopathy in the cervical, axillary or inguinal LUNGS: clear to auscultation and percussion with normal breathing effort HEART: regular rate & rhythm and no murmurs (+) B/l lower extremity edema ABDOMEN:abdomen soft, non-tender and normal bowel sounds Musculoskeletal:no cyanosis of digits and no clubbing  PSYCH: alert & oriented x 3 with fluent speech NEURO: no focal motor/sensory deficits  LABORATORY DATA:  I have reviewed the data as listed CBC Latest Ref Rng & Units 03/03/2018 03/01/2018 12/01/2016  WBC 4.0 - 10.3 K/uL 211.8(HH) 208.0 Repeated and verified X2.(HH) 6.4  Hemoglobin 13.0 - 17.1 g/dL 11.2(L) 12.1(L) 14.4  Hematocrit 38.4 - 49.9 % 35.7(L) 38.2(L) 43.9  Platelets 140 - 400 K/uL 549(H) 572.0(H) 226.0    CMP Latest Ref Rng & Units 03/01/2018 12/01/2016 11/12/2015  Glucose 70 - 99 mg/dL 86 114(H) 90  BUN 6 - 23 mg/dL 25(H) 20 18  Creatinine 0.40 - 1.50 mg/dL 1.22 0.97 1.08  Sodium 135 - 145 mEq/L 143 141 141  Potassium 3.5 - 5.1 mEq/L 4.3 5.1 4.7  Chloride 96 - 112 mEq/L 102 106 105  CO2 19 - 32 mEq/L '29 31 31  '$ Calcium 8.4 - 10.5 mg/dL 9.7 8.8 8.7  Total Protein 6.0 - 8.3 g/dL 6.8 6.0 5.9(L)  Total Bilirubin 0.2 - 1.2 mg/dL 1.0 0.6 0.9  Alkaline Phos 39 - 117 U/L 112 42 43  AST 0 - 37  U/L '25 17 21  '$ ALT 0 - 53 U/L '21 25 27     '$ RADIOGRAPHIC STUDIES: I have personally reviewed the radiological images as listed and agreed with the findings in the report. No results found.  ASSESSMENT & PLAN:  [name] Aguilar is a 62 y.o. Caucasian male with a history of Atrial Fibrillation, Diverticulosis and Hyperlipidemia.   1. Chronic Myelogenous Leukocytosis, Chronic phase   -His initial lab from his PCP on 03/01/18 showed WBC of 208K.  -Repeat CBC today (03/03/18) showed WBC at 211.8, with predominant neutrophil with significant left shift, 14% metamyelocytes, 10% myelocytes, 3% promyelocytes, and occasional blasts (<1%).  I reviewed his peripheral blood smear myself, and I discussed with pathologist Dr. Gari Crown who reviewed his smear also.  The peripheral blood smear finding is most consistent with CML. -We reviewed the natural course of CML  and prognosis.  Although it is not curable, but very treatable.  We have excellent targeted therapy medications for disease control.  -No hepatomegaly or splenomegaly upon initial exam.  -I recommend a bone marrow biopsy early next week to complete his definitive diagnosis. I reveiwed the procedure. He agreed. -He will proceed with Blood Marrow Biopsy on Monday. He will stop Eliquis Sunday morning and restart on Monday evening. I advised him not to eat before procedure.  -I ordered BCR/ABL FISH test today to confirm the diagnosis  -If it's confirmed CML, I will recommend Gleevec or desatinib, due to his cardiac history  -He has severe leokocytosis, I explained the amount of swelling and his SOB could be partially related to his leukostasis. While preparing his CML medication, I will start him on moderate dose hydrea '500mg'$  BID today (03/03/18) for cytoreduction, and reduce leukostasis. I plan to stop hydrea after he starts TKI   -His labs also shows elevated uric acid at 9.9 which can potentially effect his kidney function. I encouraged him to drink plenty  of water as well as prescribe allopurinol '300mg'$  BID today (03/03/18) -Will watch his labs closely, next Monday and Friday, I will titrate his Hydrea dose if needed. -F/u on 5/29  2. Atrial Fibrillation and ischemic congestive heart failure with EF 25-30% 02/2015 -On treatment with Eliquis, Lipitor, Lasix, lisinopril, metoprolol and Verapamil.  -Followed by Cardiologist Dr. Janit Pagan and PCP -I will repeat his echo before we start TKI     PLAN:  -Prescribe Allopurinol '300mg'$  BID and Hydrea '500mg'$  BID to start today  -Lab and bone marrow biopsy on 5/20, and lab on 5/24 -He will start holding Eliquis on May 19 morning, and restart in the evening of May 20 - Lab and f/u on 5/29  -will contact his cardiologist Dr. Arnette Felts to repeat his echo     All questions were answered. The patient knows to call the clinic with any problems, questions or concerns. I spent 50 minutes counseling the patient face to face. The total time spent in the appointment was 60 minutes and more than 50% was on counseling.     Truitt Merle, MD 03/03/2018    This document serves as a record of services personally performed by Truitt Merle, MD. It was created on her behalf by Joslyn Devon, a trained medical scribe. The creation of this record is based on the scribe's personal observations and the provider's statements to them.    I have reviewed the above documentation for accuracy and completeness, and I  agree with the above.

## 2018-03-04 ENCOUNTER — Encounter: Payer: Self-pay | Admitting: Hematology

## 2018-03-04 DIAGNOSIS — C9210 Chronic myeloid leukemia, BCR/ABL-positive, not having achieved remission: Secondary | ICD-10-CM | POA: Insufficient documentation

## 2018-03-04 NOTE — Progress Notes (Signed)
White blood cells confirmed to be high. Slightly more anemic on this check. Mainly neutrophils. Plan doesn't change though- glad you are already set up with Dr. Burr Medico and the bone marrow biopsy early next week.

## 2018-03-06 ENCOUNTER — Inpatient Hospital Stay: Payer: 59

## 2018-03-06 ENCOUNTER — Inpatient Hospital Stay (HOSPITAL_BASED_OUTPATIENT_CLINIC_OR_DEPARTMENT_OTHER): Payer: 59 | Admitting: Adult Health

## 2018-03-06 VITALS — BP 117/62 | HR 96 | Temp 98.4°F | Resp 17

## 2018-03-06 DIAGNOSIS — Z7901 Long term (current) use of anticoagulants: Secondary | ICD-10-CM | POA: Diagnosis not present

## 2018-03-06 DIAGNOSIS — I509 Heart failure, unspecified: Secondary | ICD-10-CM | POA: Diagnosis not present

## 2018-03-06 DIAGNOSIS — D72829 Elevated white blood cell count, unspecified: Secondary | ICD-10-CM | POA: Diagnosis not present

## 2018-03-06 DIAGNOSIS — I4891 Unspecified atrial fibrillation: Secondary | ICD-10-CM | POA: Diagnosis not present

## 2018-03-06 DIAGNOSIS — C9210 Chronic myeloid leukemia, BCR/ABL-positive, not having achieved remission: Secondary | ICD-10-CM | POA: Diagnosis not present

## 2018-03-06 DIAGNOSIS — I110 Hypertensive heart disease with heart failure: Secondary | ICD-10-CM | POA: Diagnosis not present

## 2018-03-06 DIAGNOSIS — D509 Iron deficiency anemia, unspecified: Secondary | ICD-10-CM | POA: Diagnosis not present

## 2018-03-06 DIAGNOSIS — R0602 Shortness of breath: Secondary | ICD-10-CM | POA: Diagnosis not present

## 2018-03-06 DIAGNOSIS — D4989 Neoplasm of unspecified behavior of other specified sites: Secondary | ICD-10-CM | POA: Diagnosis not present

## 2018-03-06 DIAGNOSIS — E883 Tumor lysis syndrome: Secondary | ICD-10-CM | POA: Diagnosis not present

## 2018-03-06 LAB — CBC WITH DIFFERENTIAL (CANCER CENTER ONLY)
BAND NEUTROPHILS: 26 %
BASOS ABS: 2.2 10*3/uL — AB (ref 0.0–0.1)
BASOS PCT: 1 %
BLASTS: 1 %
EOS ABS: 4.4 10*3/uL — AB (ref 0.0–0.5)
Eosinophils Relative: 2 %
HCT: 36.5 % — ABNORMAL LOW (ref 38.4–49.9)
Hemoglobin: 11.6 g/dL — ABNORMAL LOW (ref 13.0–17.1)
Lymphocytes Relative: 2 %
Lymphs Abs: 4.4 10*3/uL — ABNORMAL HIGH (ref 0.9–3.3)
MCH: 28.4 pg (ref 27.2–33.4)
MCHC: 31.9 g/dL — ABNORMAL LOW (ref 32.0–36.0)
MCV: 89 fL (ref 79.3–98.0)
METAMYELOCYTES PCT: 21 %
MONO ABS: 2.2 10*3/uL — AB (ref 0.1–0.9)
MONOS PCT: 1 %
Myelocytes: 14 %
NEUTROS ABS: 205.4 10*3/uL — AB (ref 1.5–6.5)
Neutrophils Relative %: 27 %
Other: 0 %
PLATELETS: 638 10*3/uL — AB (ref 140–400)
Promyelocytes Relative: 5 %
RBC: 4.1 MIL/uL — ABNORMAL LOW (ref 4.20–5.82)
RDW: 17.9 % — AB (ref 11.0–14.6)
WBC Count: 220.9 10*3/uL (ref 4.0–10.3)
nRBC: 1 /100 WBC — ABNORMAL HIGH

## 2018-03-06 NOTE — Patient Instructions (Addendum)
[facility] Discharge Instructions for Post Bone Marrow Procedure  Today you had a bone marrow biopsy and aspirate of the right hip   Please keep the pressure dressing in place for at least 24 hours.  Have someone check your dressing periodically for bleeding.  If needed you can reapply a pressure dressing to the site.  Take pain medication as directed by doctor.  IF BLEEDING REOCCURS THAT SHOULD BE REPORTED IMMEDIATELY. Call the [facility] at [phone] if during business hours. Or report to the Emergency Room.   I have been informed and understand all the instructions given to me. I know to contact the clinic, my physician, or go to the Emergency Department if any problems should occur. I do not have any questions at this time, but understand that I may call the clinic during office hours at [phone]  should I have any questions or need assistance in obtaining follow up care.

## 2018-03-06 NOTE — Progress Notes (Signed)
INDICATION: CML  Verified patient held his Apixiban x 3 doses  Bone Marrow Biopsy and Aspiration Procedure Note   Informed consent was obtained and potential risks including bleeding, infection and pain were reviewed with the patient.  The patient's name, date of birth, identification, consent and allergies were verified prior to the start of procedure and time out was performed.  The right posterior iliac crest was chosen as the site of biopsy.  The skin was prepped with ChloraPrep.   10 cc of 1% lidocaine was used to provide local anaesthesia.   7 cc of bone marrow aspirate was obtained followed by 1cm biopsy.  Pressure was applied to the biopsy site and bandage was placed over the biopsy site. Patient was made to lie on the back for 30 mins prior to discharge.  The procedure was tolerated well. COMPLICATIONS: None BLOOD LOSS: none The patient was discharged home in stable condition with a follow up to review results.  Patient was provided with post bone marrow biopsy instructions and instructed to call if there was any bleeding or worsening pain.  Specimens sent for flow cytometry, cytogenetics and additional studies.  Signed Scot Dock, NP

## 2018-03-07 ENCOUNTER — Telehealth: Payer: Self-pay | Admitting: Adult Health

## 2018-03-07 ENCOUNTER — Telehealth: Payer: Self-pay

## 2018-03-07 ENCOUNTER — Telehealth: Payer: Self-pay | Admitting: Hematology

## 2018-03-07 DIAGNOSIS — I428 Other cardiomyopathies: Secondary | ICD-10-CM

## 2018-03-07 NOTE — Telephone Encounter (Signed)
-----   Message from Truitt Merle, MD sent at 03/07/2018 12:27 PM EDT ----- Malachy Mood,  Please call pt and confirm if he has been taking hydrea 500mg  bid which I prescribed on 5/17. If he has been taking over the weekend, then increase to 1000mg  (2 tab) bid, due to his persistent high WBC yesterday. Please remind him his lab appointment this Friday, and encourage him to drink more water.   My LOS ON 5/17 requested lab and f/u on 5/29, it has not been scheduled, please f/u  Thanks   yan

## 2018-03-07 NOTE — Telephone Encounter (Signed)
Received message from Dr. Leamon Arnt Pt have ECHO prior to initiating chemo. Notified Pt he would receive phone call to schedule ECHO. Pt indicates understanding.   Order entered.

## 2018-03-07 NOTE — Telephone Encounter (Signed)
Per 5/20 no los 

## 2018-03-07 NOTE — Telephone Encounter (Signed)
Called patient left voice message per Dr. Burr Medico inquired about whether he was taking the Hydrea 500 mg bid prescribed on 5/17.  If you have been taking it over the weekend increase it to 1000 mg (2 tabs) twice daily due to persistent high WBC.  Reminded has a lab appointment on Friday 5/24 and increased water intake.

## 2018-03-07 NOTE — Telephone Encounter (Signed)
Appointment scheduled (lab) awaiting response from [place] regarding f/u appt on 5/29 per 5/17 los.  Patient notified

## 2018-03-08 ENCOUNTER — Telehealth: Payer: Self-pay | Admitting: Hematology

## 2018-03-08 ENCOUNTER — Telehealth: Payer: Self-pay | Admitting: Pharmacy Technician

## 2018-03-08 ENCOUNTER — Telehealth: Payer: Self-pay | Admitting: Pharmacist

## 2018-03-08 ENCOUNTER — Other Ambulatory Visit: Payer: Self-pay | Admitting: Hematology

## 2018-03-08 DIAGNOSIS — C9210 Chronic myeloid leukemia, BCR/ABL-positive, not having achieved remission: Secondary | ICD-10-CM

## 2018-03-08 LAB — PATHOLOGIST SMEAR REVIEW

## 2018-03-08 MED ORDER — IMATINIB MESYLATE 400 MG PO TABS: 400.0000 mg | ORAL_TABLET | Freq: Every day | ORAL | 2 refills | Status: DC

## 2018-03-08 MED ORDER — IMATINIB MESYLATE 400 MG PO TABS
400.0000 mg | ORAL_TABLET | Freq: Every day | ORAL | 2 refills | Status: DC
Start: 2018-03-08 — End: 2018-06-07

## 2018-03-08 MED FILL — FLUTICASONE PROP 50 MCG SPR: 50 | 30 days supply | Qty: 16 | Fill #5

## 2018-03-08 NOTE — Telephone Encounter (Signed)
Spoke to patient regarding upcoming appointment updates.

## 2018-03-08 NOTE — Telephone Encounter (Signed)
Oral Oncology Pharmacist Encounter  Received new prescription for Gleevec (imatinib) for the treatment of newly-diagnosed chronic myeloid leukemia in chronic phase, planned duration until disease progression or unacceptable toxicity.  Labs from Epic assessed 03/01/18 SCr=1.22, est CrCl > 100 mL/min (wt=128.7kg)  Patient noted to have extensive cardiac history including atrial fibrillation and nonischemic cardiomyopathy ECHO completed 03/07/18, results not read into chart at this time Previous ECHO on 03/05/15 showed extremely depressed LVEF at 22-48%, systolic function noted to be moderately to severely reduced in LV  Last EKG on 01/12/17 shows QTc 392 msec  Severe heart failure and left ventricular dysfunction have been reported with Gleevec use Patient will be closely monitored for increase in cardiac dysfunction. We will follow-up ECHO reading from yesterday  Patient will be counseled to alert the office with increase in lower extremity edema or shortness of breath after starting on therapy with Gleevec.  Patient at risk for tumor lysis syndrome due to tumor burden Uric acid and LDH are elevated Patient has been started on hydroxyurea and allopurinol  Current medication list in Epic reviewed, DDIs with Gleevec identified:  Gleevec and Eliquis: category C interaction: Gleevec is a moderate inhibitor of CYP3A4, leading to possible decreased metabolism and increased systemic exposure to Eliquis. Gleevec also carries a risk of hemorrhage with its use. This risk applies mostly to patients with gastrointestinal stromal tumors, however patient will be extensively counseled on risk for bleeding.  Gleevec and verapamil : category C interaction: also due to CYP3A4 inhibition from [place]. Patient will be counseled to monitor BP at [facility] initiation.  Gleevec and metoprolol: category C interaction: Gleevec is also an inhibitor of CYP2D6 leading to possible decreased metabolism and increased  systemic exposure to metoprolol. HR will be closely monitored, patient fairly tachycardic on most readings..  Prescription has been e-scribed to the [facility] for benefits analysis and approval. Insurance authorization is being submitted as urgent.  Oral Oncology Clinic will continue to follow for insurance authorization, copayment issues, initial counseling and start date.  Johny Drilling, PharmD, BCPS, BCOP  03/08/2018 3:05 PM Oral Oncology Clinic [phone]

## 2018-03-08 NOTE — Telephone Encounter (Signed)
Oral Oncology Patient Advocate Encounter  Received notification from [employer] that prior authorization for [place] is required.  PA submitted via phone.  Supporting documentation and lab results have been faxed to [phone].  The request was marked as urgent.    Status is pending  Oral Oncology Clinic will continue to follow.  Fabio Asa. Melynda Keller, [place] Patient [place] [phone] 03/08/2018 3:43 PM

## 2018-03-09 NOTE — Telephone Encounter (Signed)
Oral Chemotherapy Pharmacist Encounter   I spoke with patient for overview of [place].   Counseled patient on administration, dosing, side effects, monitoring, drug-food interactions, safe handling, storage, and disposal.  Patient will take Gleevec 400mg  tablets, 1 tablet (400mg ) by mouth once daily with a meal and a large glass of water.  Patient knows food may decrease stomach irritation and to maintain hydration while on treatment with Gleevec.  Patient counseled to avoid grapefruit and grapefruit juice.  Gleevec start date: TBD, likely week of 03/13/18  Adverse effects include but are not limited to: nausea, vomiting, diarrhea, fatigue, muscle cramps, lower extremity edema, rash, decreased blood counts, GI bleeding, and cardiac dysfunction.  Patient will call office if he experiences nausea to request anti-emetic prescription be sent to local pharmacy.   Patient will obtain anti diarrheal and alert the office of 4 or more loose stools above baseline.  Reviewed with patient importance of keeping a medication schedule and plan for any missed doses.  Mr. Johannes voiced understanding and appreciation.   All questions answered. Medication reconciliation performed and medication/allergy list updated.  Patient counseled he will stop hydroxyurea when he starts [place]. Patient confirmed he is currently taking allopurinol and hydroxyurea as directed by Dr. Burr Medico. Lab check tomorrow 03/10/18 for continued hydroxyurea dosing.  Will follow up with patient regarding insurance and pharmacy once authorization is obtained.   Patient scheduled for ECHO on 03/14/18 and [facility] follow-up on 03/15/18 Patient likely to start [place] after ECHO assessment.  Patient knows to call the office with questions or concerns. Oral Oncology Clinic will continue to follow.  Thank you,  Johny Drilling, PharmD, BCPS, BCOP  03/09/2018   4:03 PM Oral Oncology Clinic [phone]

## 2018-03-09 NOTE — Telephone Encounter (Signed)
Oral Chemotherapy Pharmacist Encounter   Attempted to reach patient to provide update and offer for initial counseling on oral medication: [place].  No answer. Left VM for patient to call back with any questions or issues.   Insurance authorization is still pending. We will have more information about copayment after authorization is obtained. I will be out of the office tomorrow (03/10/18) and the cancer center is closed Monday 03/13/18 in observance of Memorial Day. We will follow-up with patient early next week to provide more information and offer for initial counseling.  Thank you,  Johny Drilling, PharmD, BCPS, BCOP  03/09/2018   3:44 PM Oral Oncology Clinic [phone]

## 2018-03-10 ENCOUNTER — Inpatient Hospital Stay: Payer: 59

## 2018-03-10 ENCOUNTER — Other Ambulatory Visit: Payer: Self-pay

## 2018-03-10 DIAGNOSIS — I4891 Unspecified atrial fibrillation: Secondary | ICD-10-CM | POA: Diagnosis not present

## 2018-03-10 DIAGNOSIS — I110 Hypertensive heart disease with heart failure: Secondary | ICD-10-CM | POA: Diagnosis not present

## 2018-03-10 DIAGNOSIS — C9210 Chronic myeloid leukemia, BCR/ABL-positive, not having achieved remission: Secondary | ICD-10-CM

## 2018-03-10 DIAGNOSIS — I509 Heart failure, unspecified: Secondary | ICD-10-CM | POA: Diagnosis not present

## 2018-03-10 DIAGNOSIS — D72829 Elevated white blood cell count, unspecified: Secondary | ICD-10-CM | POA: Diagnosis not present

## 2018-03-10 DIAGNOSIS — R0602 Shortness of breath: Secondary | ICD-10-CM | POA: Diagnosis not present

## 2018-03-10 DIAGNOSIS — E883 Tumor lysis syndrome: Secondary | ICD-10-CM | POA: Diagnosis not present

## 2018-03-10 DIAGNOSIS — Z7901 Long term (current) use of anticoagulants: Secondary | ICD-10-CM | POA: Diagnosis not present

## 2018-03-10 LAB — CBC WITH DIFFERENTIAL (CANCER CENTER ONLY)
BASOS ABS: 4.3 10*3/uL — AB (ref 0.0–0.1)
BLASTS: 2 %
Band Neutrophils: 17 %
Basophils Relative: 2 %
Eosinophils Absolute: 2.1 10*3/uL — ABNORMAL HIGH (ref 0.0–0.5)
Eosinophils Relative: 1 %
HEMATOCRIT: 34.1 % — AB (ref 38.4–49.9)
HEMOGLOBIN: 11.2 g/dL — AB (ref 13.0–17.1)
LYMPHS ABS: 4.3 10*3/uL — AB (ref 0.9–3.3)
Lymphocytes Relative: 2 %
MCH: 28.9 pg (ref 27.2–33.4)
MCHC: 32.7 g/dL (ref 32.0–36.0)
MCV: 88.3 fL (ref 79.3–98.0)
MYELOCYTES: 16 %
Metamyelocytes Relative: 10 %
Monocytes Absolute: 0 10*3/uL — ABNORMAL LOW (ref 0.1–0.9)
Monocytes Relative: 0 %
Neutro Abs: 198.4 10*3/uL — ABNORMAL HIGH (ref 1.5–6.5)
Neutrophils Relative %: 45 %
Other: 0 %
PROMYELOCYTES RELATIVE: 5 %
Platelet Count: 579 10*3/uL — ABNORMAL HIGH (ref 140–400)
RBC: 3.86 MIL/uL — AB (ref 4.20–5.82)
RDW: 17.9 % — ABNORMAL HIGH (ref 11.0–14.6)
WBC: 213.3 10*3/uL — AB (ref 4.0–10.3)
nRBC: 0 /100 WBC

## 2018-03-10 LAB — CMP (CANCER CENTER ONLY)
ALT: 27 U/L (ref 0–55)
AST: 35 U/L — AB (ref 5–34)
Albumin: 4 g/dL (ref 3.5–5.0)
Alkaline Phosphatase: 136 U/L (ref 40–150)
Anion gap: 11 (ref 3–11)
BILIRUBIN TOTAL: 0.9 mg/dL (ref 0.2–1.2)
BUN: 25 mg/dL (ref 7–26)
CALCIUM: 10.1 mg/dL (ref 8.4–10.4)
CO2: 24 mmol/L (ref 22–29)
CREATININE: 1.37 mg/dL — AB (ref 0.70–1.30)
Chloride: 104 mmol/L (ref 98–109)
GFR, EST NON AFRICAN AMERICAN: 53 mL/min — AB (ref >60)
Glucose, Bld: 102 mg/dL (ref 70–140)
Potassium: 4.9 mmol/L (ref 3.5–5.1)
Sodium: 139 mmol/L (ref 136–145)
TOTAL PROTEIN: 7.2 g/dL (ref 6.4–8.3)

## 2018-03-10 LAB — LACTATE DEHYDROGENASE: LDH: 933 U/L — ABNORMAL HIGH (ref 125–245)

## 2018-03-10 LAB — PHOSPHORUS: PHOSPHORUS: 5.3 mg/dL — AB (ref 2.5–4.6)

## 2018-03-10 LAB — MAGNESIUM: MAGNESIUM: 2.2 mg/dL (ref 1.7–2.4)

## 2018-03-10 LAB — URIC ACID: Uric Acid, Serum: 6.1 mg/dL (ref 2.6–7.4)

## 2018-03-10 MED ORDER — HYDROXYUREA 500 MG PO CAPS: 2000.0000 mg | ORAL_CAPSULE | Freq: Two times a day (BID) | ORAL | 0 refills | Status: AC

## 2018-03-10 MED FILL — HYDROXYUREA 500 MG CAPSULE: 500 | 7 days supply | Qty: 60 | Fill #0

## 2018-03-10 NOTE — Telephone Encounter (Signed)
Spoke with patient per Dr. Burr Medico instructed to take 2000 mg (4 - 500 mg tabs) twice daily, patient verbalized an understanding.

## 2018-03-14 ENCOUNTER — Ambulatory Visit (HOSPITAL_COMMUNITY): Payer: 59 | Attending: Cardiology

## 2018-03-14 ENCOUNTER — Other Ambulatory Visit: Payer: Self-pay

## 2018-03-14 ENCOUNTER — Encounter: Payer: 59 | Admitting: Hematology

## 2018-03-14 DIAGNOSIS — I422 Other hypertrophic cardiomyopathy: Secondary | ICD-10-CM | POA: Insufficient documentation

## 2018-03-14 DIAGNOSIS — Z6834 Body mass index (BMI) 34.0-34.9, adult: Secondary | ICD-10-CM | POA: Insufficient documentation

## 2018-03-14 DIAGNOSIS — I071 Rheumatic tricuspid insufficiency: Secondary | ICD-10-CM | POA: Insufficient documentation

## 2018-03-14 DIAGNOSIS — I517 Cardiomegaly: Secondary | ICD-10-CM | POA: Diagnosis not present

## 2018-03-14 DIAGNOSIS — E785 Hyperlipidemia, unspecified: Secondary | ICD-10-CM | POA: Insufficient documentation

## 2018-03-14 DIAGNOSIS — I272 Pulmonary hypertension, unspecified: Secondary | ICD-10-CM | POA: Diagnosis not present

## 2018-03-14 DIAGNOSIS — E669 Obesity, unspecified: Secondary | ICD-10-CM | POA: Insufficient documentation

## 2018-03-14 DIAGNOSIS — I4891 Unspecified atrial fibrillation: Secondary | ICD-10-CM | POA: Insufficient documentation

## 2018-03-14 DIAGNOSIS — I428 Other cardiomyopathies: Secondary | ICD-10-CM | POA: Insufficient documentation

## 2018-03-14 MED ORDER — PERFLUTREN LIPID MICROSPHERE
1.0000 mL | INTRAVENOUS | Status: AC | PRN
Start: 2018-03-14 — End: 2018-03-14
  Administered 2018-03-14: 2 mL via INTRAVENOUS

## 2018-03-14 MED FILL — IMATINIB MESYLATE 400 MG TA: 400 | 30 days supply | Qty: 30 | Fill #0

## 2018-03-14 NOTE — Progress Notes (Signed)
[facility]  [phone] Fax:(336) [phone]  Clinic Follow Up Note   Patient Care Team: Marin Olp, MD as PCP - General (Family Medicine) Evans Lance, MD as Consulting Physician (Cardiology)   Date of Service:  03/15/2018    CHIEF COMPLAINTS:  F/u for Chronic Myelogenous Leukocytosis, Chronic phase    HISTORY OF PRESENTING ILLNESS:  03/03/18  [name] Aguilar 62 y.o. male is a here because of Leukocytosis. The patient was referred by his PCP Dr. Yong Channel. The patient presents to the clinic today by himself.  He has been under treatment for Afib with medication for 2 years now. In 11-09/2017 he started getting pitting edema in his legs. He planned to see his PCP sooner but finally saw him 2 days ago. His cardiologist is Dr. Janit Pagan and saw him last in 12/2017. He plans to see him back this month. Pt notes swelling with erythema and dry skin on B/l LE. He notes increased SOB with exertion. This SOB has occurred for the past 2 months.   Socially he is married with kids and works as a Animal nutritionist. He notes he had to renew his insurance for this year and had blood workup at that time early this year/late last year with no abnormal blood counts. He notes the best way to reach him is his Work phone then his home phone.  In the past the patient was diagnosed with significant comorbidities of... Pt notes he has is on medication for HTN but notes this is prophylactic given his weak heart. His past ECHO showed in 2016 showed EF at 30-35%. He denies history of blood clots. He had a cardiac cath but this was clear for blockage. His father passed form glial blastoma. His last colonoscopy was 12/24/2015 by Dr. Carlean Purl. His recent PSA was normal according to the patient. He denies history of cancer.   On review of symptoms, pt notes dry, scaly, red skin of bilateral lower extremity with old "weepiness" from skin that is now dried up. This causes itching in the skin  at these locations. Pt notes SOB upon exertion. He denies having a fever. He denies any weight loss lately.  He denies feeling any lumps, bumps or lymph nodes.   He was found to have abnormal CBC from 02/2018 He denies recent chest pain on exertion, shortness of breath on minimal exertion, pre-syncopal episodes, or palpitations. He had not noticed any recent bleeding such as epistaxis, hematuria or hematochezia The patient denies over the counter NSAID ingestion. He is on Eliquis. His last colonoscopy was 12/2015 He had no prior history or diagnosis of cancer. His age appropriate screening programs are up-to-date. He denies any pica and eats a variety of diet.   CURRENT THERAPY  hydrea 547m BID and allopurinol 3043mBID starting on 03/03/18. Increased Hydrea to 100061mID on 03/07/18 and 2000m65md on 03/10/18, will stop on 03/20/18. Start Gleevec 400mg70mly on 03/15/18.     INTERVAL HISTORY  [name] Aguilar for a follow up of severe leukocytosis and discuss workup. He presents to the clinic today noting he has been tolerating Hydrea. He was contact by [facility]. His Copay would be $130 a month. He notes his insurance may start to reduce his copay as he based on his deductible. He notes his happiness his ECHO results show improved heart function.     On review of symptoms, pt notes his SOB is stable, he has  been coughing more and his appetite has increased.      MEDICAL HISTORY:  Past Medical History:  Diagnosis Date  . Atrial fibrillation ([place])   . Diverticulosis   . Hx of adenomatous colonic polyps   . Hyperlipidemia   . Hypertension   . Vitreous anomalies 2014   Tack down of vitreous tear    SURGICAL HISTORY: Past Surgical History:  Procedure Laterality Date  . CARDIAC CATHETERIZATION N/A 04/30/2015   Procedure: Left Heart Cath and Coronary Angiography;  Surgeon: Jerline Pain, MD;  Location: [facility];  Service: Cardiovascular;   Laterality: N/A;  . CARDIOVERSION N/A 11/20/2014   Procedure: CARDIOVERSION;  Surgeon: Pixie Casino, MD;  Location: [facility] ENDOSCOPY;  Service: Cardiovascular;  Laterality: N/A;  11:38 Etomidate given IV for unsuccessful synched cardioversion @ 150 joules from Afib, 11:39 repeated synched cardioversion @ 200 joules successfully from Afib to SR. 12 lead EKG ordered to confirm..  . COLONOSCOPY    . COLONOSCOPY N/A 12/24/2015   Procedure: COLONOSCOPY ( ANAPHALAXIS ALLERGY TO EGGS);  Surgeon: Gatha Mayer, MD;  Location: [facility] ENDOSCOPY;  Service: Endoscopy;  Laterality: N/A;  anaphalaxis allergy to eggs  . EYE SURGERY  2014    SOCIAL HISTORY: Social History   Socioeconomic History  . Marital status: Married    Spouse name: Not on file  . Number of children: Not on file  . Years of education: Not on file  . Highest education level: Not on file  Occupational History  . Not on file  Social Needs  . Financial resource strain: Not on file  . Food insecurity:    Worry: Not on file    Inability: Not on file  . Transportation needs:    Medical: Not on file    Non-medical: Not on file  Tobacco Use  . Smoking status: Never Smoker  . Smokeless tobacco: Never Used  Substance and Sexual Activity  . Alcohol use: Yes    Alcohol/week: 0.0 oz    Comment: 1 drink once a month max  . Drug use: No  . Sexual activity: Yes  Lifestyle  . Physical activity:    Days per week: Not on file    Minutes per session: Not on file  . Stress: Not on file  Relationships  . Social connections:    Talks on phone: Not on file    Gets together: Not on file    Attends religious service: Not on file    Active member of club or organization: Not on file    Attends meetings of clubs or organizations: Not on file    Relationship status: Not on file  . Intimate partner violence:    Fear of current or ex partner: Not on file    Emotionally abused: Not on file    Physically abused: Not on file    Forced sexual  activity: Not on file  Other Topics Concern  . Not on file  Social History Narrative   Married 1977. 2 children (son in doctorate program at [employer] nearing end and daughter with [facility] in [place]). No grandkids   [employer] [place]   [employer] for [employer].       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 3 dogs, 1 cat, 1 snake.      Hobbies: fishing on reservoirs in [place], family time, nature, hiking    FAMILY HISTORY: Family History  Problem  Relation Age of Onset  . Asthma Mother   . Alzheimer's disease Mother        diagnosed 84  . Brain cancer Father        not genetic  . Other Brother        gilbert disease  . Heart attack Maternal Grandmother   . Heart attack Maternal Uncle   . Colon cancer Neg Hx   . Esophageal cancer Neg Hx   . Pancreatic cancer Neg Hx   . Liver disease Neg Hx     ALLERGIES:  is allergic to eggs or egg-derived products.  MEDICATIONS:  Current Outpatient Medications  Medication Sig Dispense Refill  . allopurinol (ZYLOPRIM) 300 MG tablet Take 1 tablet (300 mg total) by mouth daily. 60 tablet 0  . ELIQUIS 5 MG TABS tablet TAKE 1 TABLET BY MOUTH 2 TIMES DAILY. 60 tablet 11  . fluticasone (FLONASE) 50 MCG/ACT nasal spray INSTILL 2 SPRAYS INTO BOTH NOSTRILS DAILY 16 g 6  . hydroxyurea (HYDREA) 500 MG capsule Take 4 capsules (2,000 mg total) by mouth 2 (two) times daily for 15 days. May take with food to minimize GI side effects. 60 capsule 0  . imatinib (GLEEVEC) 400 MG tablet Take 1 tablet (400 mg total) by mouth daily. Take with meals and large glass of water. Caution:Chemotherapy. 30 tablet 2  . lisinopril (PRINIVIL,ZESTRIL) 5 MG tablet Take 1 tablet (5 mg total) by mouth daily. Please keep upcoming appt for future refills. Thank you 30 tablet 11  . atorvastatin (LIPITOR) 10 MG tablet Take 1 tablet by mouth every other day. 45 tablet 3  . furosemide (LASIX) 80 MG tablet Take 1 tablet (80  mg total) by mouth daily. 90 tablet 3  . metoprolol (TOPROL XL) 200 MG 24 hr tablet Take 1/2 tab in the morning and 1 tab at night. 135 tablet 3   No current facility-administered medications for this visit.     REVIEW OF SYSTEMS:   Constitutional: Denies fevers, chills or abnormal night sweats (+) increase in appetite  Eyes: Denies blurriness of vision, double vision or watery eyes Ears, nose, mouth, throat, and face: Denies mucositis or sore throat Respiratory: Denies dyspnea or wheezes (+) SOB upon moderate exertion  (+) moderate cough  Cardiovascular: Denies palpitation, chest discomfort (+) swelling of B/L lower extremity swelling Gastrointestinal:  Denies nausea, heartburn or change in bowel habits Skin: Denies abnormal skin rashes (+)  Dry, scaly, red skin of B/l LE Lymphatics: Denies new lymphadenopathy or easy bruising Neurological:Denies numbness, tingling or new weaknesses Behavioral/Psych: Mood is stable, no new changes  All other systems were reviewed with the patient and are negative.  PHYSICAL EXAMINATION: ECOG PERFORMANCE STATUS: 1 - Symptomatic but completely ambulatory  Vitals:   03/15/18 0753  BP: 102/72  Pulse: 100  Resp: 18  Temp: 98.9 F (37.2 C)  SpO2: 100%   Filed Weights   03/15/18 0753  Weight: 286 lb (129.7 kg)    GENERAL:alert, no distress and comfortable SKIN: skin color, texture, turgor are normal, no rashes or significant lesions (+) Dry, scaly skin with erythema of B/l LE EYES: normal, conjunctiva are pink and non-injected, sclera clear OROPHARYNX:no exudate, no erythema and lips, buccal mucosa, and tongue normal  NECK: supple, thyroid normal size, non-tender, without nodularity LYMPH:  no palpable lymphadenopathy in the cervical, axillary or inguinal LUNGS: clear to auscultation and percussion with normal breathing effort HEART: regular rate & rhythm and no murmurs (+) B/l lower extremity edema ABDOMEN:abdomen  soft, non-tender and normal  bowel sounds Musculoskeletal:no cyanosis of digits and no clubbing, (+) b/l LE edema with skin erythema at low right leg, no ulcers  PSYCH: alert & oriented x 3 with fluent speech NEURO: no focal motor/sensory deficits  LABORATORY DATA:  I have reviewed the data as listed CBC Latest Ref Rng & Units 03/15/2018 03/10/2018 03/06/2018  WBC 4.0 - 10.3 K/uL 134.5(HH) 213.3(HH) 220.9(HH)  Hemoglobin 13.0 - 17.1 g/dL 11.7(L) 11.2(L) 11.6(L)  Hematocrit 38.4 - 49.9 % 36.1(L) 34.1(L) 36.5(L)  Platelets 140 - 400 K/uL 498(H) 579(H) 638(H)    CMP Latest Ref Rng & Units 03/15/2018 03/10/2018 03/01/2018  Glucose 70 - 140 mg/dL 121 102 86  BUN 7 - 26 mg/dL 30(H) 25 25(H)  Creatinine 0.70 - 1.30 mg/dL 1.24 1.37(H) 1.22  Sodium 136 - 145 mmol/L 139 139 143  Potassium 3.5 - 5.1 mmol/L 5.0 4.9 4.3  Chloride 98 - 109 mmol/L 104 104 102  CO2 22 - 29 mmol/L '25 24 29  ' Calcium 8.4 - 10.4 mg/dL 9.3 10.1 9.7  Total Protein 6.4 - 8.3 g/dL 6.4 7.2 6.8  Total Bilirubin 0.2 - 1.2 mg/dL 0.9 0.9 1.0  Alkaline Phos 40 - 150 U/L 110 136 112  AST 5 - 34 U/L 35(H) 35(H) 25  ALT 0 - 55 U/L '24 27 21     ' PATHOLOGY  Diagnosis Bone Marrow, Aspirate,Biopsy, and Clot - HYPERCELLULAR BONE MARROW WITH MYELOPROLIFERATIVE NEOPLASM. - SEE COMMENT. PERIPHERAL BLOOD: - NORMOCYTIC-HYPOCHROMIC ANEMIA. - MARKED LEUKOCYTOSIS. - THROMBOCYTOSIS. Diagnosis Note The morphologic features favor chronic myeloid leukemia. Correlation with cytogenetic and FISH studies is recommended. (BNS:ecj 03-10-2018) Microscopic LAB DATA: CBC performed on 03/06/2018 shows: WBC 220.9 K/ul Neutrophils 32% HB 11.6 g/dl Lymphocytes 4% HCT 36.5 % Monocytes 1% MCV 89.0 fL Eosinophils 1% 1 of 3 FINAL for [name] Aguilar, [name] Aguilar CASE [phone]) Microscopic(continued) RDW 17.9 % Basophils 1% PLT 638 K/ul Band Neutrophils 22% Metamyelotcytes 3% Myelocytes 27% Promyelocytes 8% Blasts 1%  Bone Marrow count performed on 500 cells shows: Blasts: 1% Myeloid  97% Promyelocyts: 6% Myelocytes: 25% Erythroid 3% Metamyelocyts: 3% Bands: 22% Lymphocytes: 0% Neutrophils: 34% Eosinophils: 3% Plasma Cells: 0% Basophils: 3% Monocytes: 0% M:E ratio: 32.3   ECHO 03/14/18  Study Conclusions - Procedure narrative: Transthoracic echocardiography. Image   quality was poor. The study was technically difficult.   Intravenous contrast (Definity) was administered to opacify the   LV. - Left ventricle: The cavity size was mildly dilated. Wall   thickness was normal. Systolic function was normal. The estimated   ejection fraction was in the range of 55% to 60%. Wall motion was   normal; there were no regional wall motion abnormalities. - Left atrium: The atrium was mildly dilated. - Right ventricle: The cavity size was mildly dilated. - Right atrium: The atrium was mildly dilated. - Pulmonary arteries: Systolic pressure was moderately increased.   PA peak pressure: 49 mm Hg (S). Impressions: - Definity used; normal LV systolic function; mild LVE; mild   LAE/RAE and RVE; mild TR with moderate pulmonary hypertension.    RADIOGRAPHIC STUDIES: I have personally reviewed the radiological images as listed and agreed with the findings in the report. No results found.  ASSESSMENT & PLAN:  [name] Aguilar is a 62 y.o. Caucasian male with a history of Atrial Fibrillation, Diverticulosis and Hyperlipidemia.   1. Chronic Myelogenous Leukocytosis, Chronic phase   -His initial lab from his PCP on 03/01/18 showed WBC of 208K.  -Repeat CBC on 03/03/18 showed  WBC at 211.8, with predominant neutrophil with significant left shift, 14% metamyelocytes, 10% myelocytes, 3% promyelocytes, and occasional blasts (<1%).  I reviewed his peripheral blood smear myself, and I discussed with pathologist Dr. Gari Crown who reviewed his smear also.  The peripheral blood smear finding is most consistent with CML. -We reviewed the natural course of CML  and prognosis.  Although it is not  curable, it's still very treatable. We have excellent targeted therapy medications for disease control.  -No hepatomegaly or splenomegaly upon initial exam.  -his bone marrow biopsy confirmed CML chronic phase, his BCR/ABL FISH was positive  -I have started him on hydrea, dose increase gradually over the last week, his WBC came down to 134K today.  -From previous labs it appears his was exhibiting tumor lysis syndrome. His Uric Acid is now normal at 5.8. His WBC has reduced to 134. His HG has remained stable at 11.7.  -I discussed CML treatment options. I recommend Gleevec which has the least cardiac toxicity and pleural effusion issues comparing to second generation TKI. Potential side effects were discussed with pt, we will watch for diarrhea, SOB, leg swelling and pancytopenia. He agrees to start.  Will monitor his response with regular BCR/ABL PCR every 3 month.  -He will start Gleevec 437m daily today and stop Hydrea in 5 days.  -Continue Allopurinol for now  -F/u in 4 weeks with lab in 2 weeks    2. Atrial Fibrillation and ischemic congestive heart failure with EF 25-30% 02/2015 -On treatment with Eliquis, Lipitor, Lasix, lisinopril, metoprolol and Verapamil.  -Followed by Cardiologist Dr. GJanit Paganand PCP --ECHO from 03/14/18 shows his EF has improved to 55-60%    PLAN:  -Stop hydrea in 5 days and start GSt. Charlesat 4047mdaily today. -Continue Allopurinol 30052mID  -Lab in 2 weeks -Lab and f/u in 4 weeks   All questions were answered. The patient knows to call the clinic with any problems, questions or concerns. I spent 20 minutes counseling the patient face to face. The total time spent in the appointment was 30 minutes and more than 50% was on counseling.     YanTruitt MerleD 03/15/2018    I, AmoJoslyn Devonm acting as scribe for YanTruitt MerleD.    I have reviewed the above documentation for accuracy and completeness, and I agree with the above.

## 2018-03-15 ENCOUNTER — Encounter: Payer: Self-pay | Admitting: Hematology

## 2018-03-15 ENCOUNTER — Encounter: Payer: Self-pay | Admitting: Internal Medicine

## 2018-03-15 ENCOUNTER — Telehealth: Payer: Self-pay | Admitting: Hematology

## 2018-03-15 ENCOUNTER — Ambulatory Visit (INDEPENDENT_AMBULATORY_CARE_PROVIDER_SITE_OTHER): Payer: 59 | Admitting: Internal Medicine

## 2018-03-15 ENCOUNTER — Inpatient Hospital Stay (HOSPITAL_BASED_OUTPATIENT_CLINIC_OR_DEPARTMENT_OTHER): Payer: 59 | Admitting: Hematology

## 2018-03-15 ENCOUNTER — Inpatient Hospital Stay: Payer: 59

## 2018-03-15 VITALS — BP 102/72 | HR 100 | Temp 98.9°F | Resp 18 | Ht 76.0 in | Wt 286.0 lb

## 2018-03-15 VITALS — BP 110/82 | HR 109 | Ht 76.0 in | Wt 284.6 lb

## 2018-03-15 DIAGNOSIS — E883 Tumor lysis syndrome: Secondary | ICD-10-CM

## 2018-03-15 DIAGNOSIS — C9210 Chronic myeloid leukemia, BCR/ABL-positive, not having achieved remission: Secondary | ICD-10-CM

## 2018-03-15 DIAGNOSIS — I4891 Unspecified atrial fibrillation: Secondary | ICD-10-CM | POA: Diagnosis not present

## 2018-03-15 DIAGNOSIS — I509 Heart failure, unspecified: Secondary | ICD-10-CM | POA: Diagnosis not present

## 2018-03-15 DIAGNOSIS — I428 Other cardiomyopathies: Secondary | ICD-10-CM

## 2018-03-15 DIAGNOSIS — R0602 Shortness of breath: Secondary | ICD-10-CM

## 2018-03-15 DIAGNOSIS — I4819 Other persistent atrial fibrillation: Secondary | ICD-10-CM

## 2018-03-15 DIAGNOSIS — Z7901 Long term (current) use of anticoagulants: Secondary | ICD-10-CM

## 2018-03-15 DIAGNOSIS — D72829 Elevated white blood cell count, unspecified: Secondary | ICD-10-CM

## 2018-03-15 DIAGNOSIS — I481 Persistent atrial fibrillation: Secondary | ICD-10-CM | POA: Diagnosis not present

## 2018-03-15 DIAGNOSIS — I110 Hypertensive heart disease with heart failure: Secondary | ICD-10-CM | POA: Diagnosis not present

## 2018-03-15 LAB — URIC ACID: Uric Acid, Serum: 5.8 mg/dL (ref 2.6–7.4)

## 2018-03-15 LAB — MAGNESIUM: Magnesium: 2.3 mg/dL (ref 1.7–2.4)

## 2018-03-15 LAB — CMP (CANCER CENTER ONLY)
ALT: 24 U/L (ref 0–55)
AST: 35 U/L — AB (ref 5–34)
Albumin: 3.5 g/dL (ref 3.5–5.0)
Alkaline Phosphatase: 110 U/L (ref 40–150)
Anion gap: 10 (ref 3–11)
BILIRUBIN TOTAL: 0.9 mg/dL (ref 0.2–1.2)
BUN: 30 mg/dL — AB (ref 7–26)
CHLORIDE: 104 mmol/L (ref 98–109)
CO2: 25 mmol/L (ref 22–29)
CREATININE: 1.24 mg/dL (ref 0.70–1.30)
Calcium: 9.3 mg/dL (ref 8.4–10.4)
GFR, Est AFR Am: >60 mL/min (ref >60)
GLUCOSE: 121 mg/dL (ref 70–140)
Potassium: 5 mmol/L (ref 3.5–5.1)
Sodium: 139 mmol/L (ref 136–145)
TOTAL PROTEIN: 6.4 g/dL (ref 6.4–8.3)

## 2018-03-15 LAB — CBC WITH DIFFERENTIAL (CANCER CENTER ONLY)
Band Neutrophils: 13 %
Basophils Absolute: 1.3 10*3/uL — ABNORMAL HIGH (ref 0.0–0.1)
Basophils Relative: 1 %
Blasts: 1 %
EOS ABS: 1.3 10*3/uL — AB (ref 0.0–0.5)
Eosinophils Relative: 1 %
HCT: 36.1 % — ABNORMAL LOW (ref 38.4–49.9)
Hemoglobin: 11.7 g/dL — ABNORMAL LOW (ref 13.0–17.1)
LYMPHS PCT: 1 %
Lymphs Abs: 1.3 10*3/uL (ref 0.9–3.3)
MCH: 29.2 pg (ref 27.2–33.4)
MCHC: 32.4 g/dL (ref 32.0–36.0)
MCV: 90 fL (ref 79.3–98.0)
MONOS PCT: 2 %
Metamyelocytes Relative: 13 %
Monocytes Absolute: 2.7 10*3/uL — ABNORMAL HIGH (ref 0.1–0.9)
Myelocytes: 7 %
NEUTROS PCT: 61 %
NRBC: 3 /100{WBCs} — AB
Neutro Abs: 126.4 10*3/uL — ABNORMAL HIGH (ref 1.5–6.5)
OTHER: 0 %
PLATELETS: 498 10*3/uL — AB (ref 140–400)
Promyelocytes Relative: 0 %
RBC: 4.01 MIL/uL — AB (ref 4.20–5.82)
RDW: 18.1 % — ABNORMAL HIGH (ref 11.0–14.6)
WBC: 134.5 10*3/uL — AB (ref 4.0–10.3)

## 2018-03-15 LAB — PHOSPHORUS: Phosphorus: 4.9 mg/dL — ABNORMAL HIGH (ref 2.5–4.6)

## 2018-03-15 MED ORDER — ATORVASTATIN CALCIUM 10 MG PO TABS: ORAL_TABLET | ORAL | 3 refills | Status: DC

## 2018-03-15 MED ORDER — METOPROLOL SUCCINATE ER 200 MG PO TB24: ORAL_TABLET | ORAL | 3 refills | Status: DC

## 2018-03-15 MED ORDER — FUROSEMIDE 80 MG PO TABS: 80.0000 mg | ORAL_TABLET | Freq: Every day | ORAL | 3 refills | Status: DC

## 2018-03-15 MED FILL — ATORVASTATIN 10 MG TABLET: 10 | 90 days supply | Qty: 45 | Fill #0

## 2018-03-15 MED FILL — FUROSEMIDE 80 MG TAB: 80 | 90 days supply | Qty: 90 | Fill #0

## 2018-03-15 NOTE — Telephone Encounter (Signed)
Oral Oncology Patient Advocate Encounter  Prior Authorization for [facility] has been approved.    PA# [id number] Effective dates: 03/10/2018 through 03/10/2019  Oral Oncology Clinic will continue to follow.   Fabio Asa. Melynda Keller, [facility] Patient [place] [phone] 03/15/2018 11:01 AM

## 2018-03-15 NOTE — Progress Notes (Signed)
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 Over the past 6 months, his CHF symptoms have worsened, especially his right sided symptoms. He underwent repeat echo yesteday and his EF was read as 50% with elevated PA pressures. The patient has had 3+ edema. There is some dietary indiscretion. He denies non-compliance. He does not have palpitations. He was recently diagnosed with CML. Allergies  Allergen Reactions  . Eggs Or Egg-Derived Products Anaphylaxis    REACTION: no vaccines     Current Outpatient Medications  Medication Sig Dispense Refill  . allopurinol (ZYLOPRIM) 300 MG tablet Take 1 tablet (300 mg total) by mouth daily. 60 tablet 0  . atorvastatin (LIPITOR) 10 MG tablet Take 1 tablet by mouth every other day. 45 tablet 2  . ELIQUIS 5 MG TABS tablet TAKE 1 TABLET BY MOUTH 2 TIMES DAILY. 60 tablet 11  . fluticasone (FLONASE) 50 MCG/ACT nasal spray INSTILL 2 SPRAYS INTO BOTH NOSTRILS DAILY 16 g 6  . furosemide (LASIX) 20 MG tablet Take 1 tablet (20 mg total) by mouth daily. 30 tablet 3  . hydroxyurea (HYDREA) 500 MG capsule Take 4 capsules (2,000 mg total) by mouth 2 (two) times daily for 15 days. May take with food to minimize GI side effects. 60 capsule 0  . imatinib (GLEEVEC) 400 MG tablet Take 1 tablet (400 mg total) by mouth daily. Take with meals and large glass of water. Caution:Chemotherapy. 30 tablet 2  . lisinopril (PRINIVIL,ZESTRIL) 5 MG tablet Take 1 tablet (5 mg total) by mouth daily. Please keep upcoming appt for future refills. Thank you 30 tablet 11   No current facility-administered medications for this visit.      Past Medical History:  Diagnosis Date  . Atrial fibrillation ([place])   . Diverticulosis   . Hx of adenomatous colonic polyps   . Hyperlipidemia   . Hypertension   . Vitreous anomalies 2014   Tack down of vitreous tear     ROS:   All systems reviewed and negative except as noted in the HPI.   Past Surgical History:  Procedure Laterality Date  . CARDIAC CATHETERIZATION N/A 04/30/2015   Procedure: Left Heart Cath and Coronary Angiography;  Surgeon: Jerline Pain, MD;  Location: [facility] CV LAB;  Service: Cardiovascular;  Laterality: N/A;  . CARDIOVERSION N/A 11/20/2014   Procedure: CARDIOVERSION;  Surgeon: Pixie Casino, MD;  Location: [facility] ENDOSCOPY;  Service: Cardiovascular;  Laterality: N/A;  11:38 Etomidate given IV for unsuccessful synched cardioversion @ 150 joules from Afib, 11:39 repeated synched cardioversion @ 200 joules successfully from Afib to SR. 12 lead EKG ordered to confirm..  . COLONOSCOPY    . COLONOSCOPY N/A 12/24/2015   Procedure: COLONOSCOPY ( ANAPHALAXIS ALLERGY TO EGGS);  Surgeon: Gatha Mayer, MD;  Location: [facility] ENDOSCOPY;  Service: Endoscopy;  Laterality: N/A;  anaphalaxis allergy to eggs  . EYE SURGERY  2014     Family History  Problem Relation Age of Onset  . Asthma Mother   . Alzheimer's disease Mother        diagnosed 92  . Brain cancer Father        not genetic  . Other Brother        gilbert disease  . Heart attack Maternal Grandmother   . Heart attack Maternal Uncle   .  Colon cancer Neg Hx   . Esophageal cancer Neg Hx   . Pancreatic cancer Neg Hx   . Liver disease Neg Hx      Social History   Socioeconomic History  . Marital status: Married    Spouse name: Not on file  . Number of children: Not on file  . Years of education: Not on file  . Highest education level: Not on file  Occupational History  . Not on file  Social Needs  . Financial resource strain: Not on file  . Food insecurity:    Worry: Not on file    Inability: Not on file  . Transportation needs:    Medical: Not on file    Non-medical: Not on file  Tobacco Use  . Smoking status: Never Smoker  . Smokeless tobacco: Never Used  Substance and Sexual Activity  . Alcohol use: Yes     Alcohol/week: 0.0 oz    Comment: 1 drink once a month max  . Drug use: No  . Sexual activity: Yes  Lifestyle  . Physical activity:    Days per week: Not on file    Minutes per session: Not on file  . Stress: Not on file  Relationships  . Social connections:    Talks on phone: Not on file    Gets together: Not on file    Attends religious service: Not on file    Active member of club or organization: Not on file    Attends meetings of clubs or organizations: Not on file    Relationship status: Not on file  . Intimate partner violence:    Fear of current or ex partner: Not on file    Emotionally abused: Not on file    Physically abused: Not on file    Forced sexual activity: Not on file  Other Topics Concern  . Not on file  Social History Narrative   Married 1977. 2 children (son in doctorate program at [employer] nearing end and daughter with [facility] in [place]). 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 3 dogs, 1 cat, 1 snake.      Hobbies: fishing on reservoirs in [place], family time, nature, hiking     BP 110/82   Pulse (!) 109   Ht 6\' 4"  (1.93 m)   Wt 284 lb 9.6 oz (129.1 kg)   SpO2 95%   BMI 34.64 kg/m   Physical Exam:  stable appearing 62 yo man, NAD HEENT: Unremarkable Neck:  7 cm JVD, no thyromegally Lymphatics:  No adenopathy Back:  No CVA tenderness Lungs:  Clear with minimal basilar rales HEART:  IRegular rate rhythm, no murmurs, no rubs, no clicks Abd:  soft, positive bowel sounds, no organomegally, no rebound, no guarding Ext:  2 plus pulses, 3+ edema, no cyanosis, no clubbing Skin:  No rashes no nodules Neuro:  CN II through XII intact, motor grossly intact  EKG - atrial fib with a RVR  2D echo - preserved LV function with PA 49  Assess/Plan: 1. Chronic systolic heart failure - his recent EF is improved. He will  continue his ACE. I will increase his lasix to 80 mg daily. He is strongly encouraged to reduce his salt intake. 2. Atrial fib - his rates are not well controlled. I have asked him to uptitrate  his beta blocker and stop verapamil due to the edema. Might consider digoxin 3. Obesity - he is encouraged to lose weight. 4. CML - this is a new diagnosis and he is about to start chemo. 5. Pulmonary HTN - I suspect this is a consequence of left sided symptoms. We will follow.   Mikle Bosworth.D.

## 2018-03-15 NOTE — Telephone Encounter (Signed)
Scheduled appt per 5/29 los - pt aware not print out wanted - my chart active.

## 2018-03-15 NOTE — Patient Instructions (Addendum)
Medication Instructions:  Your physician has recommended you make the following change in your medication: 1.  Stop taking verapamil. 2.  Increase your furosemide from 20 mg daily to 80 mg daily.  You may take 4 tablets daily until you pick up your new medication. 3.  Increase your Toprol XL 200 mg.  Take 1/2 tablet in the am and 1 tablet in the evening.  Labwork: Have lab work sent to [place].  Testing/Procedures: None ordered.  Follow-Up: Your physician wants you to follow-up in: 7-8 weeks with Dr. Lovena Le.      Any Other Special Instructions Will Be Listed Below (If Applicable).  If you need a refill on your cardiac medications before your next appointment, please call your pharmacy.

## 2018-03-17 ENCOUNTER — Encounter (HOSPITAL_COMMUNITY): Payer: Self-pay | Admitting: Hematology

## 2018-03-17 ENCOUNTER — Ambulatory Visit: Payer: 59 | Admitting: Nurse Practitioner

## 2018-03-17 LAB — TISSUE HYBRIDIZATION (BONE MARROW)-NCBH

## 2018-03-17 LAB — CHROMOSOME ANALYSIS, BONE MARROW

## 2018-03-22 MED FILL — ELIQUIS 5 MG TABLET: 5 | 30 days supply | Qty: 60 | Fill #3

## 2018-03-24 MED FILL — Perflutren Lipid Microsphere IV Susp 6.52 MG/ML: INTRAVENOUS | Qty: 2 | Status: AC

## 2018-03-28 LAB — BCR/ABL

## 2018-03-29 ENCOUNTER — Other Ambulatory Visit: Payer: Self-pay | Admitting: Hematology

## 2018-03-29 ENCOUNTER — Inpatient Hospital Stay: Payer: 59 | Attending: Hematology

## 2018-03-29 ENCOUNTER — Telehealth: Payer: Self-pay

## 2018-03-29 DIAGNOSIS — I110 Hypertensive heart disease with heart failure: Secondary | ICD-10-CM | POA: Insufficient documentation

## 2018-03-29 DIAGNOSIS — I509 Heart failure, unspecified: Secondary | ICD-10-CM | POA: Diagnosis not present

## 2018-03-29 DIAGNOSIS — C9210 Chronic myeloid leukemia, BCR/ABL-positive, not having achieved remission: Secondary | ICD-10-CM | POA: Insufficient documentation

## 2018-03-29 DIAGNOSIS — I4891 Unspecified atrial fibrillation: Secondary | ICD-10-CM | POA: Insufficient documentation

## 2018-03-29 LAB — CBC WITH DIFFERENTIAL (CANCER CENTER ONLY)
BASOS ABS: 0.1 10*3/uL (ref 0.0–0.1)
BASOS PCT: 1 %
Eosinophils Absolute: 0.1 10*3/uL (ref 0.0–0.5)
Eosinophils Relative: 1 %
HEMATOCRIT: 33.3 % — AB (ref 38.4–49.9)
Hemoglobin: 10.9 g/dL — ABNORMAL LOW (ref 13.0–17.1)
Lymphocytes Relative: 9 %
Lymphs Abs: 0.4 10*3/uL — ABNORMAL LOW (ref 0.9–3.3)
MCH: 28.9 pg (ref 27.2–33.4)
MCHC: 32.9 g/dL (ref 32.0–36.0)
MCV: 88 fL (ref 79.3–98.0)
Monocytes Absolute: 1 10*3/uL — ABNORMAL HIGH (ref 0.1–0.9)
Monocytes Relative: 20 %
NEUTROS ABS: 3.4 10*3/uL (ref 1.5–6.5)
NEUTROS PCT: 69 %
Platelet Count: 291 10*3/uL (ref 140–400)
RBC: 3.78 MIL/uL — ABNORMAL LOW (ref 4.20–5.82)
RDW: 18.8 % — AB (ref 11.0–14.6)
WBC Count: 5 10*3/uL (ref 4.0–10.3)

## 2018-03-29 LAB — CMP (CANCER CENTER ONLY)
ALBUMIN: 3.5 g/dL (ref 3.5–5.0)
ALK PHOS: 115 U/L (ref 40–150)
ALT: 25 U/L (ref 0–55)
AST: 22 U/L (ref 5–34)
Anion gap: 9 (ref 3–11)
BILIRUBIN TOTAL: 0.9 mg/dL (ref 0.2–1.2)
BUN: 26 mg/dL (ref 7–26)
CO2: 28 mmol/L (ref 22–29)
CREATININE: 1.11 mg/dL (ref 0.70–1.30)
Calcium: 8.9 mg/dL (ref 8.4–10.4)
Chloride: 102 mmol/L (ref 98–109)
GFR, Est AFR Am: >60 mL/min (ref >60)
GLUCOSE: 104 mg/dL (ref 70–140)
Potassium: 4.4 mmol/L (ref 3.5–5.1)
Sodium: 139 mmol/L (ref 136–145)
TOTAL PROTEIN: 6 g/dL — AB (ref 6.4–8.3)

## 2018-03-29 LAB — URIC ACID: Uric Acid, Serum: 5.9 mg/dL (ref 2.6–7.4)

## 2018-03-29 LAB — LACTATE DEHYDROGENASE: LDH: 233 U/L (ref 125–245)

## 2018-03-29 LAB — MAGNESIUM: Magnesium: 2.2 mg/dL (ref 1.7–2.4)

## 2018-03-29 LAB — PHOSPHORUS: Phosphorus: 3.2 mg/dL (ref 2.5–4.6)

## 2018-03-29 NOTE — Telephone Encounter (Signed)
Spoke with patient per Dr. Burr Medico lab results show he is having a great response so far, WBC normalized and thrombocytosis has also resolved.  Has mild anemia possible related to Hydrea and Gleevec, recommended he take a multivitamin with minerals.  Will follow up in 2 weeks, Patient already has an appointment.

## 2018-03-29 NOTE — Telephone Encounter (Signed)
-----   Message from Truitt Merle, MD sent at 03/29/2018  8:19 AM EDT ----- Please let patient know the lab results, he has had great response so far, WBC normalized, his thrombocytosis also resolved.  He has developed mild anemia, possibly related to Hydrea and Gleevec, I recommend him to take multivitamins with minerals.  I will see him back in 2 weeks.  Truitt Merle  03/29/2018

## 2018-03-30 ENCOUNTER — Other Ambulatory Visit: Payer: Self-pay | Admitting: Hematology

## 2018-04-05 DIAGNOSIS — H40013 Open angle with borderline findings, low risk, bilateral: Secondary | ICD-10-CM | POA: Diagnosis not present

## 2018-04-05 DIAGNOSIS — H3563 Retinal hemorrhage, bilateral: Secondary | ICD-10-CM | POA: Diagnosis not present

## 2018-04-05 DIAGNOSIS — H3581 Retinal edema: Secondary | ICD-10-CM | POA: Diagnosis not present

## 2018-04-05 DIAGNOSIS — H35372 Puckering of macula, left eye: Secondary | ICD-10-CM | POA: Diagnosis not present

## 2018-04-05 DIAGNOSIS — H524 Presbyopia: Secondary | ICD-10-CM | POA: Diagnosis not present

## 2018-04-05 MED FILL — METOPROLOL SUCCINATE ER 200: 200 | 90 days supply | Qty: 135 | Fill #0

## 2018-04-12 ENCOUNTER — Encounter: Payer: Self-pay | Admitting: Hematology

## 2018-04-12 ENCOUNTER — Telehealth: Payer: Self-pay

## 2018-04-12 ENCOUNTER — Inpatient Hospital Stay (HOSPITAL_BASED_OUTPATIENT_CLINIC_OR_DEPARTMENT_OTHER): Payer: 59 | Admitting: Hematology

## 2018-04-12 ENCOUNTER — Inpatient Hospital Stay: Payer: 59

## 2018-04-12 VITALS — BP 101/63 | HR 104 | Temp 98.4°F | Resp 24 | Ht 76.0 in | Wt 271.5 lb

## 2018-04-12 DIAGNOSIS — C9210 Chronic myeloid leukemia, BCR/ABL-positive, not having achieved remission: Secondary | ICD-10-CM

## 2018-04-12 DIAGNOSIS — I509 Heart failure, unspecified: Secondary | ICD-10-CM | POA: Diagnosis not present

## 2018-04-12 DIAGNOSIS — I4891 Unspecified atrial fibrillation: Secondary | ICD-10-CM | POA: Diagnosis not present

## 2018-04-12 DIAGNOSIS — I110 Hypertensive heart disease with heart failure: Secondary | ICD-10-CM | POA: Diagnosis not present

## 2018-04-12 DIAGNOSIS — E669 Obesity, unspecified: Secondary | ICD-10-CM

## 2018-04-12 LAB — CMP (CANCER CENTER ONLY)
ALT: 25 U/L (ref 0–44)
ANION GAP: 9 (ref 5–15)
AST: 20 U/L (ref 15–41)
Albumin: 3.5 g/dL (ref 3.5–5.0)
Alkaline Phosphatase: 154 U/L — ABNORMAL HIGH (ref 38–126)
BUN: 19 mg/dL (ref 8–23)
CHLORIDE: 104 mmol/L (ref 98–111)
CO2: 27 mmol/L (ref 22–32)
Calcium: 8.8 mg/dL — ABNORMAL LOW (ref 8.9–10.3)
Creatinine: 1.28 mg/dL — ABNORMAL HIGH (ref 0.61–1.24)
GFR, Estimated: 58 mL/min — ABNORMAL LOW (ref >60)
Glucose, Bld: 111 mg/dL — ABNORMAL HIGH (ref 70–99)
Potassium: 4.7 mmol/L (ref 3.5–5.1)
SODIUM: 140 mmol/L (ref 135–145)
Total Bilirubin: 1 mg/dL (ref 0.3–1.2)
Total Protein: 6.3 g/dL — ABNORMAL LOW (ref 6.5–8.1)

## 2018-04-12 LAB — CBC WITH DIFFERENTIAL (CANCER CENTER ONLY)
Basophils Absolute: 0 10*3/uL (ref 0.0–0.1)
Basophils Relative: 1 %
Eosinophils Absolute: 0 10*3/uL (ref 0.0–0.5)
Eosinophils Relative: 1 %
HEMATOCRIT: 33.1 % — AB (ref 38.4–49.9)
HEMOGLOBIN: 10.9 g/dL — AB (ref 13.0–17.1)
LYMPHS ABS: 0.4 10*3/uL — AB (ref 0.9–3.3)
Lymphocytes Relative: 8 %
MCH: 29.1 pg (ref 27.2–33.4)
MCHC: 32.9 g/dL (ref 32.0–36.0)
MCV: 88.2 fL (ref 79.3–98.0)
MONOS PCT: 4 %
Monocytes Absolute: 0.2 10*3/uL (ref 0.1–0.9)
NEUTROS ABS: 5 10*3/uL (ref 1.5–6.5)
NEUTROS PCT: 86 %
Platelet Count: 226 10*3/uL (ref 140–400)
RBC: 3.75 MIL/uL — AB (ref 4.20–5.82)
RDW: 20.1 % — ABNORMAL HIGH (ref 11.0–14.6)
WBC: 5.8 10*3/uL (ref 4.0–10.3)

## 2018-04-12 MED FILL — LISINOPRIL 5 MG TABLET: 5 | 30 days supply | Qty: 30 | Fill #1

## 2018-04-12 MED FILL — IMATINIB MESYLATE 400 MG TA: 400 | 30 days supply | Qty: 30 | Fill #1

## 2018-04-12 MED FILL — FLUTICASONE PROP 50 MCG SPR: 50 | 30 days supply | Qty: 16 | Fill #6

## 2018-04-12 NOTE — Telephone Encounter (Signed)
Printed avs and calender of upcoming appointment. Per 6/6 los 

## 2018-04-12 NOTE — Progress Notes (Signed)
[facility]  [phone] [phone]  Clinic Follow Up Note   Patient Care Team: Marin Olp, MD as PCP - General (Family Medicine) Evans Lance, MD as Consulting Physician (Cardiology)   Date of Service:  04/12/2018    CHIEF COMPLAINTS:  F/u for CML, Chronic phase    HISTORY OF PRESENTING ILLNESS:  03/03/18  [name] Aguilar 62 y.o. male is a here because of Leukocytosis. The patient was referred by his PCP Dr. Yong Channel. The patient presents to the clinic today by himself.  He has been under treatment for Afib with medication for 2 years now. In 11-09/2017 he started getting pitting edema in his legs. He planned to see his PCP sooner but finally saw him 2 days ago. His cardiologist is Dr. Janit Pagan and saw him last in 12/2017. He plans to see him back this month. Pt notes swelling with erythema and dry skin on B/l LE. He notes increased SOB with exertion. This SOB has occurred for the past 2 months.   Socially he is married with kids and works as a Animal nutritionist. He notes he had to renew his insurance for this year and had blood workup at that time early this year/late last year with no abnormal blood counts. He notes the best way to reach him is his Work phone then his home phone.  In the past the patient was diagnosed with significant comorbidities of... Pt notes he has is on medication for HTN but notes this is prophylactic given his weak heart. His past ECHO showed in 2016 showed EF at 30-35%. He denies history of blood clots. He had a cardiac cath but this was clear for blockage. His father passed form glial blastoma. His last colonoscopy was 12/24/2015 by Dr. Carlean Purl. His recent PSA was normal according to the patient. He denies history of cancer.   On review of symptoms, pt notes dry, scaly, red skin of bilateral lower extremity with old "weepiness" from skin that is now dried up. This causes itching in the skin at these locations. Pt notes  SOB upon exertion. He denies having a fever. He denies any weight loss lately.  He denies feeling any lumps, bumps or lymph nodes.   He was found to have abnormal CBC from 02/2018 He denies recent chest pain on exertion, shortness of breath on minimal exertion, pre-syncopal episodes, or palpitations. He had not noticed any recent bleeding such as epistaxis, hematuria or hematochezia The patient denies over the counter NSAID ingestion. He is on Eliquis. His last colonoscopy was 12/2015 He had no prior history or diagnosis of cancer. His age appropriate screening programs are up-to-date. He denies any pica and eats a variety of diet.   CURRENT THERAPY  hydrea 583m BID and allopurinol 3098mBID starting on 03/03/18. Increased Hydrea to 100014mID on 03/07/18 and 2000m21md on 03/10/18, stopped on 03/20/18. Start Gleevec 400mg42mly on 03/15/18.     INTERVAL HISTORY  [name] for a follow up.  He has been on GleevPlentywooda months, tolerating very well.  He denies any significant gastric discomfort, his bowel movement was initially slightly loose, normal now.  He denies any NSAID significant side effects.  He saw his cardiologist to 3 weeks ago and he is Lasix was increased.  His leg swelling and dyspnea have much improved lately, his energy level also improved.  The open wound in the posterior right lower extremity has improved.  He has  good appetite, weight is stable.  No other complaints.   MEDICAL HISTORY:  Past Medical History:  Diagnosis Date  . Atrial fibrillation ([place])   . Diverticulosis   . Hx of adenomatous colonic polyps   . Hyperlipidemia   . Hypertension   . Vitreous anomalies 2014   Tack down of vitreous tear    SURGICAL HISTORY: Past Surgical History:  Procedure Laterality Date  . CARDIAC CATHETERIZATION N/A 04/30/2015   Procedure: Left Heart Cath and Coronary Angiography;  Surgeon: Jerline Pain, MD;  Location: [place] [facility];  Service: Cardiovascular;   Laterality: N/A;  . CARDIOVERSION N/A 11/20/2014   Procedure: CARDIOVERSION;  Surgeon: Pixie Casino, MD;  Location: [facility];  Service: Cardiovascular;  Laterality: N/A;  11:38 Etomidate given IV for unsuccessful synched cardioversion @ 150 joules from Afib, 11:39 repeated synched cardioversion @ 200 joules successfully from Afib to SR. 12 lead EKG ordered to confirm..  . COLONOSCOPY    . COLONOSCOPY N/A 12/24/2015   Procedure: COLONOSCOPY ( ANAPHALAXIS ALLERGY TO EGGS);  Surgeon: Gatha Mayer, MD;  Location: [facility];  Service: Endoscopy;  Laterality: N/A;  anaphalaxis allergy to eggs  . EYE SURGERY  2014    SOCIAL HISTORY: Social History   Socioeconomic History  . Marital status: Married    Spouse name: Not on file  . Number of children: Not on file  . Years of education: Not on file  . Highest education level: Not on file  Occupational History  . Not on file  Social Needs  . Financial resource strain: Not on file  . Food insecurity:    Worry: Not on file    Inability: Not on file  . Transportation needs:    Medical: Not on file    Non-medical: Not on file  Tobacco Use  . Smoking status: Never Smoker  . Smokeless tobacco: Never Used  Substance and Sexual Activity  . Alcohol use: Yes    Alcohol/week: 0.0 oz    Comment: 1 drink once a month max  . Drug use: No  . Sexual activity: Yes  Lifestyle  . Physical activity:    Days per week: Not on file    Minutes per session: Not on file  . Stress: Not on file  Relationships  . Social connections:    Talks on phone: Not on file    Gets together: Not on file    Attends religious service: Not on file    Active member of club or organization: Not on file    Attends meetings of clubs or organizations: Not on file    Relationship status: Not on file  . Intimate partner violence:    Fear of current or ex partner: Not on file    Emotionally abused: Not on file    Physically abused: Not on file    Forced sexual  activity: Not on file  Other Topics Concern  . Not on file  Social History Narrative   Married 1977. 2 children (son in doctorate program at [employer] nearing end and daughter with [facility] in [place]). No grandkids   [employer]   [employer] for [employer].       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 3 dogs, 1 cat, 1 snake.      Hobbies: fishing on reservoirs in [place], family time, nature, hiking    FAMILY HISTORY: Family History  Problem Relation Age  of Onset  . Asthma Mother   . Alzheimer's disease Mother        diagnosed 23  . Brain cancer Father        not genetic  . Other Brother        gilbert disease  . Heart attack Maternal Grandmother   . Heart attack Maternal Uncle   . Colon cancer Neg Hx   . Esophageal cancer Neg Hx   . Pancreatic cancer Neg Hx   . Liver disease Neg Hx     ALLERGIES:  is allergic to eggs or egg-derived products.  MEDICATIONS:  Current Outpatient Medications  Medication Sig Dispense Refill  . allopurinol (ZYLOPRIM) 300 MG tablet Take 1 tablet (300 mg total) by mouth daily. 60 tablet 0  . atorvastatin (LIPITOR) 10 MG tablet Take 1 tablet by mouth every other day. 45 tablet 3  . ELIQUIS 5 MG TABS tablet TAKE 1 TABLET BY MOUTH 2 TIMES DAILY. 60 tablet 11  . fluticasone (FLONASE) 50 MCG/ACT nasal spray INSTILL 2 SPRAYS INTO BOTH NOSTRILS DAILY 16 g 6  . furosemide (LASIX) 80 MG tablet Take 1 tablet (80 mg total) by mouth daily. 90 tablet 3  . imatinib (GLEEVEC) 400 MG tablet Take 1 tablet (400 mg total) by mouth daily. Take with meals and large glass of water. Caution:Chemotherapy. 30 tablet 2  . lisinopril (PRINIVIL,ZESTRIL) 5 MG tablet Take 1 tablet (5 mg total) by mouth daily. Please keep upcoming appt for future refills. Thank you 30 tablet 11  . metoprolol (TOPROL XL) 200 MG 24 hr tablet Take 1/2 tab in the morning and 1 tab at night. 135 tablet 3   No current  facility-administered medications for this visit.     REVIEW OF SYSTEMS:   Constitutional: Denies fevers, chills or abnormal night sweats  Eyes: Denies blurriness of vision, double vision or watery eyes Ears, nose, mouth, throat, and face: Denies mucositis or sore throat Respiratory: Denies dyspnea or wheezes (+) SOB upon heavy exertion, overall improved Cardiovascular: Denies palpitation, chest discomfort (+) swelling of B/L lower extremity swelling Gastrointestinal:  Denies nausea, heartburn or change in bowel habits Skin: Denies abnormal skin rashes (+) wound in right LE Lymphatics: Denies new lymphadenopathy or easy bruising Neurological:Denies numbness, tingling or new weaknesses Behavioral/Psych: Mood is stable, no new changes  All other systems were reviewed with the patient and are negative.  PHYSICAL EXAMINATION: ECOG PERFORMANCE STATUS: 1 - Symptomatic but completely ambulatory  Vitals:   04/12/18 0906 04/12/18 0925  BP: 101/63   Pulse: (!) 127 (!) 104  Resp: (!) 24   Temp: 98.4 F (36.9 C)   SpO2: 100%    Filed Weights   04/12/18 0906  Weight: 271 lb 8 oz (123.2 kg)    GENERAL:alert, no distress and comfortable SKIN: skin color, texture, turgor are normal, no rashes or significant lesions (+) Dry, scaly skin with erythema of B/l LE, and healing wound in posterior of his right lower extremity EYES: normal, conjunctiva are pink and non-injected, sclera clear OROPHARYNX:no exudate, no erythema and lips, buccal mucosa, and tongue normal  NECK: supple, thyroid normal size, non-tender, without nodularity LYMPH:  no palpable lymphadenopathy in the cervical, axillary or inguinal LUNGS: clear to auscultation and percussion with normal breathing effort HEART: regular rate & rhythm and no murmurs (+) B/l lower extremity edema ABDOMEN:abdomen soft, non-tender and normal bowel sounds Musculoskeletal:no cyanosis of digits and no clubbing, (+) b/l LE edema with skin erythema at  low right leg, (+)  healing wound (see picture below)  PSYCH: alert & oriented x 3 with fluent speech NEURO: no focal motor/sensory deficits     LABORATORY DATA:  I have reviewed the data as listed CBC Latest Ref Rng & Units 04/12/2018 03/29/2018 03/15/2018  WBC 4.0 - 10.3 K/uL 5.8 5.0 134.5(HH)  Hemoglobin 13.0 - 17.1 g/dL 10.9(L) 10.9(L) 11.7(L)  Hematocrit 38.4 - 49.9 % 33.1(L) 33.3(L) 36.1(L)  Platelets 140 - 400 K/uL 226 291 498(H)    CMP Latest Ref Rng & Units 04/12/2018 03/29/2018 03/15/2018  Glucose 70 - 99 mg/dL 111(H) 104 121  BUN 8 - 23 mg/dL 19 26 30(H)  Creatinine 0.61 - 1.24 mg/dL 1.28(H) 1.11 1.24  Sodium 135 - 145 mmol/L 140 139 139  Potassium 3.5 - 5.1 mmol/L 4.7 4.4 5.0  Chloride 98 - 111 mmol/L 104 102 104  CO2 22 - 32 mmol/L '27 28 25  ' Calcium 8.9 - 10.3 mg/dL 8.8(L) 8.9 9.3  Total Protein 6.5 - 8.1 g/dL 6.3(L) 6.0(L) 6.4  Total Bilirubin 0.3 - 1.2 mg/dL 1.0 0.9 0.9  Alkaline Phos 38 - 126 U/L 154(H) 115 110  AST 15 - 41 U/L 20 22 35(H)  ALT 0 - 44 U/L '25 25 24     ' PATHOLOGY  Diagnosis Bone Marrow, Aspirate,Biopsy, and Clot - HYPERCELLULAR BONE MARROW WITH MYELOPROLIFERATIVE NEOPLASM. - SEE COMMENT. PERIPHERAL BLOOD: - NORMOCYTIC-HYPOCHROMIC ANEMIA. - MARKED LEUKOCYTOSIS. - THROMBOCYTOSIS. Diagnosis Note The morphologic features favor chronic myeloid leukemia. Correlation with cytogenetic and FISH studies is recommended. (BNS:ecj March 27, 2018) Microscopic LAB DATA: CBC performed on 03/06/2018 shows: WBC 220.9 K/ul Neutrophils 32% HB 11.6 g/dl Lymphocytes 4% HCT 36.5 % Monocytes 1% MCV 89.0 fL Eosinophils 1% 1 of 3 FINAL for SEMAJE, KINKER CASE [id number]) Microscopic(continued) RDW 17.9 % Basophils 1% PLT 638 K/ul Band Neutrophils 22% Metamyelotcytes 3% Myelocytes 27% Promyelocytes 8% Blasts 1%  Bone Marrow count performed on 500 cells shows: Blasts: 1% Myeloid 97% Promyelocyts: 6% Myelocytes: 25% Erythroid 3% Metamyelocyts: 3% Bands: 22%  Lymphocytes: 0% Neutrophils: 34% Eosinophils: 3% Plasma Cells: 0% Basophils: 3% Monocytes: 0% M:E ratio: 32.3   ECHO 03/14/18  Study Conclusions - Procedure narrative: Transthoracic echocardiography. Image   quality was poor. The study was technically difficult.   Intravenous contrast (Definity) was administered to opacify the   LV. - Left ventricle: The cavity size was mildly dilated. Wall   thickness was normal. Systolic function was normal. The estimated   ejection fraction was in the range of 55% to 60%. Wall motion was   normal; there were no regional wall motion abnormalities. - Left atrium: The atrium was mildly dilated. - Right ventricle: The cavity size was mildly dilated. - Right atrium: The atrium was mildly dilated. - Pulmonary arteries: Systolic pressure was moderately increased.   PA peak pressure: 49 mm Hg (S). Impressions: - Definity used; normal LV systolic function; mild LVE; mild   LAE/RAE and RVE; mild TR with moderate pulmonary hypertension.    RADIOGRAPHIC STUDIES: I have personally reviewed the radiological images as listed and agreed with the findings in the report. No results found.  ASSESSMENT & PLAN:  [name] Aguilar is a 62 y.o. Caucasian male with a history of Atrial Fibrillation, Diverticulosis and Hyperlipidemia.   1. Chronic Myelogenous Leukocytosis, Chronic phase   -His initial lab from his PCP on 03/01/18 showed WBC of 208K.  -Repeat CBC on 03/03/18 showed WBC at 211.8, with predominant neutrophil with significant left shift, 14% metamyelocytes, 10% myelocytes, 3% promyelocytes, and occasional blasts (<  1%).  I reviewed his peripheral blood smear myself, and I discussed with pathologist Dr. Gari Crown who reviewed his smear also.  The peripheral blood smear finding is most consistent with CML. -We reviewed the natural course of CML  and prognosis.  Although it is not curable, it's still very treatable. We have excellent targeted therapy medications for  disease control.  -No hepatomegaly or splenomegaly upon initial exam.  -his bone marrow biopsy confirmed CML chronic phase, his BCR/ABL FISH was positive  -He has started to [facility] at the end of May 2019, tolerating very well.   -He has achieved complete hematological response within the first month.  -Lab reviewed, white WBC 5.8, hemoglobin 10.9, mild anemia is likely related to [place].  No tumor lysis, will stop allopurinol -He has mild intermittent increased creatinine, likely related to Lasix, will continue monitoring.  - Will monitor his response with regular BCR/ABL PCR every 3 month.  -Lab and follow-up in 2 months  2. Atrial Fibrillation and ischemic congestive heart failure with EF 25-30% 02/2015 -On treatment with Eliquis, Lipitor, Lasix, lisinopril, metoprolol and Verapamil.  -Followed by Cardiologist Dr. Janit Pagan and PCP --ECHO from 03/14/18 shows his EF has improved to 55-60%  3. LE edema and wound -Secondary to CHF and leukocytosis -Much improved with diuretics and CML treatment -Superficial right lower extremity wound is healing well.  PLAN:  -continue Gleevec at 433m daily  -stop Allopurinol 3032mBID  -Plan to follow-up in 2 months -He will see his cardiologist in the next 3 to 4 weeks.  All questions were answered. The patient knows to call the clinic with any problems, questions or concerns. I spent 20 minutes counseling the patient face to face. The total time spent in the appointment was 30 minutes and more than 50% was on counseling.     YaTruitt MerleMD 04/12/2018    I, AmJoslyn Devonam acting as scribe for YaTruitt MerleMD.    I have reviewed the above documentation for accuracy and completeness, and I agree with the above.

## 2018-04-19 MED FILL — ELIQUIS 5 MG TABLET: 5 | 30 days supply | Qty: 60 | Fill #4

## 2018-04-26 ENCOUNTER — Encounter (INDEPENDENT_AMBULATORY_CARE_PROVIDER_SITE_OTHER): Payer: 59 | Admitting: Ophthalmology

## 2018-04-26 DIAGNOSIS — H33301 Unspecified retinal break, right eye: Secondary | ICD-10-CM

## 2018-04-26 DIAGNOSIS — H3563 Retinal hemorrhage, bilateral: Secondary | ICD-10-CM | POA: Diagnosis not present

## 2018-04-26 DIAGNOSIS — I10 Essential (primary) hypertension: Secondary | ICD-10-CM | POA: Diagnosis not present

## 2018-04-26 DIAGNOSIS — H2513 Age-related nuclear cataract, bilateral: Secondary | ICD-10-CM

## 2018-04-26 DIAGNOSIS — H43813 Vitreous degeneration, bilateral: Secondary | ICD-10-CM

## 2018-04-26 DIAGNOSIS — H35033 Hypertensive retinopathy, bilateral: Secondary | ICD-10-CM | POA: Diagnosis not present

## 2018-05-03 ENCOUNTER — Encounter: Payer: Self-pay | Admitting: Internal Medicine

## 2018-05-03 ENCOUNTER — Ambulatory Visit (INDEPENDENT_AMBULATORY_CARE_PROVIDER_SITE_OTHER): Payer: 59 | Admitting: Internal Medicine

## 2018-05-03 VITALS — BP 122/56 | HR 96 | Ht 76.0 in | Wt 263.0 lb

## 2018-05-03 DIAGNOSIS — C9210 Chronic myeloid leukemia, BCR/ABL-positive, not having achieved remission: Secondary | ICD-10-CM | POA: Diagnosis not present

## 2018-05-03 DIAGNOSIS — I4819 Other persistent atrial fibrillation: Secondary | ICD-10-CM

## 2018-05-03 DIAGNOSIS — I428 Other cardiomyopathies: Secondary | ICD-10-CM | POA: Diagnosis not present

## 2018-05-03 DIAGNOSIS — I481 Persistent atrial fibrillation: Secondary | ICD-10-CM

## 2018-05-03 NOTE — Progress Notes (Signed)
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 When I saw him last he was volume overloaded. He had not had syncope. He feels much better after adjustment of his meds. He has tried to reduce his sodium intake but admits it is a bit difficult. His weight is down 20 lbs since his last visit.  Allergies  Allergen Reactions  . Eggs Or Egg-Derived Products Anaphylaxis    REACTION: no vaccines     Current Outpatient Medications  Medication Sig Dispense Refill  . atorvastatin (LIPITOR) 10 MG tablet Take 1 tablet by mouth every other day. 45 tablet 3  . ELIQUIS 5 MG TABS tablet TAKE 1 TABLET BY MOUTH 2 TIMES DAILY. 60 tablet 11  . fluticasone (FLONASE) 50 MCG/ACT nasal spray INSTILL 2 SPRAYS INTO BOTH NOSTRILS DAILY 16 g 6  . furosemide (LASIX) 80 MG tablet Take 1 tablet (80 mg total) by mouth daily. 90 tablet 3  . imatinib (GLEEVEC) 400 MG tablet Take 1 tablet (400 mg total) by mouth daily. Take with meals and large glass of water. Caution:Chemotherapy. 30 tablet 2  . lisinopril (PRINIVIL,ZESTRIL) 5 MG tablet Take 1 tablet (5 mg total) by mouth daily. Please keep upcoming appt for future refills. Thank you 30 tablet 11  . metoprolol (TOPROL XL) 200 MG 24 hr tablet Take 1/2 tab in the morning and 1 tab at night. 135 tablet 3   No current facility-administered medications for this visit.      Past Medical History:  Diagnosis Date  . Atrial fibrillation ([place])   . Diverticulosis   . Hx of adenomatous colonic polyps   . Hyperlipidemia   . Hypertension   . Vitreous anomalies 2014   Tack down of vitreous tear    ROS:   All systems reviewed and negative except as noted in the HPI.   Past Surgical History:  Procedure Laterality Date  . CARDIAC CATHETERIZATION N/A 04/30/2015   Procedure: Left Heart Cath and Coronary Angiography;  Surgeon: Jerline Pain, MD;  Location: [place] [facility];  Service: Cardiovascular;  Laterality: N/A;   . CARDIOVERSION N/A 11/20/2014   Procedure: CARDIOVERSION;  Surgeon: Pixie Casino, MD;  Location: [facility] ENDOSCOPY;  Service: Cardiovascular;  Laterality: N/A;  11:38 Etomidate given IV for unsuccessful synched cardioversion @ 150 joules from Afib, 11:39 repeated synched cardioversion @ 200 joules successfully from Afib to SR. 12 lead EKG ordered to confirm..  . COLONOSCOPY    . COLONOSCOPY N/A 12/24/2015   Procedure: COLONOSCOPY ( ANAPHALAXIS ALLERGY TO EGGS);  Surgeon: Gatha Mayer, MD;  Location: [facility];  Service: Endoscopy;  Laterality: N/A;  anaphalaxis allergy to eggs  . EYE SURGERY  2014     Family History  Problem Relation Age of Onset  . Asthma Mother   . Alzheimer's disease Mother        diagnosed 37  . Brain cancer Father        not genetic  . Other Brother        gilbert disease  . Heart attack Maternal Grandmother   . Heart attack Maternal Uncle   . Colon cancer Neg Hx   . Esophageal cancer Neg Hx   . Pancreatic cancer Neg Hx   . Liver disease Neg Hx      Social History   Socioeconomic History  . Marital status: Married    Spouse name: Not on file  . Number of children:  Not on file  . Years of education: Not on file  . Highest education level: Not on file  Occupational History  . Not on file  Social Needs  . Financial resource strain: Not on file  . Food insecurity:    Worry: Not on file    Inability: Not on file  . Transportation needs:    Medical: Not on file    Non-medical: Not on file  Tobacco Use  . Smoking status: Never Smoker  . Smokeless tobacco: Never Used  Substance and Sexual Activity  . Alcohol use: Yes    Alcohol/week: 0.0 oz    Comment: 1 drink once a month max  . Drug use: No  . Sexual activity: Yes  Lifestyle  . Physical activity:    Days per week: Not on file    Minutes per session: Not on file  . Stress: Not on file  Relationships  . Social connections:    Talks on phone: Not on file    Gets together: Not on file     Attends religious service: Not on file    Active member of club or organization: Not on file    Attends meetings of clubs or organizations: Not on file    Relationship status: Not on file  . Intimate partner violence:    Fear of current or ex partner: Not on file    Emotionally abused: Not on file    Physically abused: Not on file    Forced sexual activity: Not on file  Other Topics Concern  . Not on file  Social History Narrative   Married 1977. 2 children (son in doctorate program at [employer] nearing end and daughter with [facility] in [place]). 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 3 dogs, 1 cat, 1 snake.      Hobbies: fishing on reservoirs in [place], family time, nature, hiking     BP (!) 122/56   Pulse 96   Ht 6\' 4"  (1.93 m)   Wt 263 lb (119.3 kg)   BMI 32.01 kg/m   Physical Exam:  Well appearing 62 yo man, NAD HEENT: Unremarkable Neck:  7 cm JVD, no thyromegally Lymphatics:  No adenopathy Back:  No CVA tenderness Lungs:  Clear with no wheezes HEART:  IRegular rate rhythm, no murmurs, no rubs, no clicks Abd:  soft, positive bowel sounds, no organomegally, no rebound, no guarding Ext:  2 plus pulses, no edema, no cyanosis, no clubbing Skin:  No rashes no nodules Neuro:  CN II through XII intact, motor grossly intact  EKG - atrial fib with a controlled VR  Assess/Plan: 1. Atrial fib - his rates are better and he is tolerating high dose beta blocker therapy. 2. Chronic systolic heart failure - his EF nearly normalized. He will continue his current meds.  3. Obesity - he has lost 20 lbs. He is encouraged to lose more.  Mikle [email or web address].D.

## 2018-05-03 NOTE — Patient Instructions (Addendum)

## 2018-05-10 MED FILL — LISINOPRIL 5 MG TABLET: 5 | 90 days supply | Qty: 90 | Fill #2

## 2018-05-10 MED FILL — IMATINIB MESYLATE 400 MG TA: 400 | 30 days supply | Qty: 30 | Fill #2

## 2018-05-17 MED FILL — ELIQUIS 5 MG TABLET: 5 | 30 days supply | Qty: 60 | Fill #5

## 2018-05-24 ENCOUNTER — Other Ambulatory Visit: Payer: Self-pay | Admitting: Family Medicine

## 2018-05-24 MED FILL — FLUTICASONE PROP 50 MCG SPR: 50 | 30 days supply | Qty: 16 | Fill #0

## 2018-05-31 LAB — BCR ABL1 FISH (GENPATH)

## 2018-06-03 NOTE — Progress Notes (Signed)
[facility]  [phone] [phone]) [phone]  Clinic Follow Up Note   Patient Care Team: Marin Olp, MD as PCP - General (Family Medicine) Evans Lance, MD as Consulting Physician (Cardiology)   Date of Service:  06/07/2018    CHIEF COMPLAINTS:  F/u for CML, Chronic phase    HISTORY OF PRESENTING ILLNESS:  03/03/18  [name] Aguilar 62 y.o. male is a here because of Leukocytosis. The patient was referred by his PCP Dr. Yong Channel. The patient presents to the clinic today by himself.  He has been under treatment for Afib with medication for 2 years now. In 11-09/2017 he started getting pitting edema in his legs. He planned to see his PCP sooner but finally saw him 2 days ago. His cardiologist is Dr. Janit Pagan and saw him last in 12/2017. He plans to see him back this month. Pt notes swelling with erythema and dry skin on B/l LE. He notes increased SOB with exertion. This SOB has occurred for the past 2 months.   Socially he is married with kids and works as a Animal nutritionist. He notes he had to renew his insurance for this year and had blood workup at that time early this year/late last year with no abnormal blood counts. He notes the best way to reach him is his Work phone then his home phone.  In the past the patient was diagnosed with significant comorbidities of... Pt notes he has is on medication for HTN but notes this is prophylactic given his weak heart. His past ECHO showed in 2016 showed EF at 30-35%. He denies history of blood clots. He had a cardiac cath but this was clear for blockage. His father passed form glial blastoma. His last colonoscopy was 12/24/2015 by Dr. Carlean Purl. His recent PSA was normal according to the patient. He denies history of cancer.   On review of symptoms, pt notes dry, scaly, red skin of bilateral lower extremity with old "weepiness" from skin that is now dried up. This causes itching in the skin at these locations. Pt notes  SOB upon exertion. He denies having a fever. He denies any weight loss lately.  He denies feeling any lumps, bumps or lymph nodes.   He was found to have abnormal CBC from 02/2018 He denies recent chest pain on exertion, shortness of breath on minimal exertion, pre-syncopal episodes, or palpitations. He had not noticed any recent bleeding such as epistaxis, hematuria or hematochezia The patient denies over the counter NSAID ingestion. He is on Eliquis. His last colonoscopy was 12/2015 He had no prior history or diagnosis of cancer. His age appropriate screening programs are up-to-date. He denies any pica and eats a variety of diet.  RESPONSE EVALUATION:  Complete hematological response: <1 month  CURRENT THERAPY  hydrea 583m BID and allopurinol 3080mBID starting on 03/03/18. Increased Hydrea to 100044mID on 03/07/18 and 2000m67md on 03/10/18, stopped on 03/20/18. Start Gleevec 400mg52mly on 03/15/18.    INTERVAL HISTORY  [name] Aguilar for a follow up.  He is here alone. He is doing well and has no new symptoms. He is tolerating Gleevec very well. He is lasix and denies worsening leg swelling. He states that he has seen his ophthalmologist and was found to have aneurysms that are due to his CML as he was told by his ophthalmologist.     MEDICAL HISTORY:  Past Medical History:  Diagnosis Date  . Atrial fibrillation (HCC) [place]  Diverticulosis   . Hx of adenomatous colonic polyps   . Hyperlipidemia   . Hypertension   . Vitreous anomalies 2014   Tack down of vitreous tear    SURGICAL HISTORY: Past Surgical History:  Procedure Laterality Date  . CARDIAC CATHETERIZATION N/A 04/30/2015   Procedure: Left Heart Cath and Coronary Angiography;  Surgeon: Jerline Pain, MD;  Location: [facility];  Service: Cardiovascular;  Laterality: N/A;  . CARDIOVERSION N/A 11/20/2014   Procedure: CARDIOVERSION;  Surgeon: Pixie Casino, MD;  Location: [facility] END[facility]COPY;  Service:  Cardiovascular;  Laterality: N/A;  11:38 Etomidate given IV for unsuccessful synched cardioversion @ 150 joules from Afib, 11:39 repeated synched cardioversion @ 200 joules successfully from Afib to SR. 12 lead EKG ordered to confirm..  . COLONOSCOPY    . COLONOSCOPY N/A 12/24/2015   Procedure: COLONOSCOPY ( ANAPHALAXIS ALLERGY TO EGGS);  Surgeon: Gatha Mayer, MD;  Location: [facility];  Service: Endoscopy;  Laterality: N/A;  anaphalaxis allergy to eggs  . EYE SURGERY  2014    SOCIAL HISTORY: Social History   Socioeconomic History  . Marital status: Married    Spouse name: Not on file  . Number of children: Not on file  . Years of education: Not on file  . Highest education level: Not on file  Occupational History  . Not on file  Social Needs  . Financial resource strain: Not on file  . Food insecurity:    Worry: Not on file    Inability: Not on file  . Transportation needs:    Medical: Not on file    Non-medical: Not on file  Tobacco Use  . Smoking status: Never Smoker  . Smokeless tobacco: Never Used  Substance and Sexual Activity  . Alcohol use: Yes    Alcohol/week: 0.0 standard drinks    Comment: 1 drink once a month max  . Drug use: No  . Sexual activity: Yes  Lifestyle  . Physical activity:    Days per week: Not on file    Minutes per session: Not on file  . Stress: Not on file  Relationships  . Social connections:    Talks on phone: Not on file    Gets together: Not on file    Attends religious service: Not on file    Active member of club or organization: Not on file    Attends meetings of clubs or organizations: Not on file    Relationship status: Not on file  . Intimate partner violence:    Fear of current or ex partner: Not on file    Emotionally abused: Not on file    Physically abused: Not on file    Forced sexual activity: Not on file  Other Topics Concern  . Not on file  Social History Narrative   Married 1977. 2 children (son in doctorate  program at [employer] nearing end and daughter with [employer] in [place]). No grandkids   [employer]   [employer] for [employer].       Veterinarian in town. [facility] (been there for 5 years, [place] 27 years before that). 3 dogs, 1 cat, 1 snake.      Hobbies: fishing on [place] in [place], family time, nature, hiking    FAMILY HISTORY: Family History  Problem Relation Age of Onset  . Asthma Mother   . Alzheimer's disease Mother        diagnosed 67  . Brain cancer Father  not genetic  . Other Brother        gilbert disease  . Heart attack Maternal Grandmother   . Heart attack Maternal Uncle   . Colon cancer Neg Hx   . Esophageal cancer Neg Hx   . Pancreatic cancer Neg Hx   . Liver disease Neg Hx     ALLERGIES:  is allergic to eggs or egg-derived products.  MEDICATIONS:  Current Outpatient Medications  Medication Sig Dispense Refill  . atorvastatin (LIPITOR) 10 MG tablet Take 1 tablet by mouth every other day. 45 tablet 3  . ELIQUIS 5 MG TABS tablet TAKE 1 TABLET BY MOUTH 2 TIMES DAILY. 60 tablet 11  . fluticasone (FLONASE) 50 MCG/ACT nasal spray INSTILL 2 SPRAYS INTO BOTH NOSTRILS DAILY 16 g 6  . furosemide (LASIX) 80 MG tablet Take 1 tablet (80 mg total) by mouth daily. 90 tablet 3  . imatinib (GLEEVEC) 400 MG tablet Take 1 tablet (400 mg total) by mouth daily. Take with meals and large glass of water. Caution:Chemotherapy. 30 tablet 3  . lisinopril (PRINIVIL,ZESTRIL) 5 MG tablet Take 1 tablet (5 mg total) by mouth daily. Please keep upcoming appt for future refills. Thank you 30 tablet 11  . metoprolol (TOPROL XL) 200 MG 24 hr tablet Take 1/2 tab in the morning and 1 tab at night. 135 tablet 3   No current facility-administered medications for this visit.     REVIEW OF SYSTEMS:   Constitutional: Denies fevers, chills or abnormal night sweats  Eyes: Denies blurriness of vision, double vision or watery eyes Ears, nose, mouth, throat, and face:  Denies mucositis or sore throat Respiratory: Denies dyspnea or wheezes Cardiovascular: Denies palpitation, chest discomfort (+) swelling of B/L lower extremity swelling, stable Gastrointestinal:  Denies nausea, heartburn or change in bowel habits Skin: Denies abnormal skin rashes  Lymphatics: Denies new lymphadenopathy or easy bruising Neurological:Denies numbness, tingling or new weaknesses Behavioral/Psych: Mood is stable, no new changes  All other systems were reviewed with the patient and are negative.  PHYSICAL EXAMINATION: ECOG PERFORMANCE STATUS: 1 - Symptomatic but completely ambulatory  Vitals:   06/07/18 0958  BP: 113/70  Pulse: 67  Resp: 17  Temp: 98.4 F (36.9 C)  SpO2: 97%   Filed Weights   06/07/18 0958  Weight: 261 lb 11.2 oz (118.7 kg)    GENERAL:alert, no distress and comfortable SKIN: skin color, texture, turgor are normal, no rashes or significant lesions, previous ulcer on right LE has healed completely  EYES: normal, conjunctiva are pink and non-injected, sclera clear OROPHARYNX:no exudate, no erythema and lips, buccal mucosa, and tongue normal  NECK: supple, thyroid normal size, non-tender, without nodularity LYMPH:  no palpable lymphadenopathy in the cervical, axillary or inguinal LUNGS: clear to auscultation and percussion with normal breathing effort HEART: regular rate & rhythm and no murmurs (+) B/l lower extremity edema ABDOMEN:abdomen soft, non-tender and normal bowel sounds Musculoskeletal:no cyanosis of digits and no clubbing PSYCH: alert & oriented x 3 with fluent speech NEURO: no focal motor/sensory deficits   LABORATORY DATA:  I have reviewed the data as listed CBC Latest Ref Rng & Units 06/07/2018 04/12/2018 03/29/2018  WBC 4.0 - 10.3 K/uL 4.7 5.8 5.0  Hemoglobin 13.0 - 17.1 g/dL 13.0 10.9(L) 10.9(L)  Hematocrit 38.4 - 49.9 % 40.5 33.1(L) 33.3(L)  Platelets 140 - 400 K/uL 154 226 291    CMP Latest Ref Rng & Units 06/07/2018 04/12/2018  03/29/2018  Glucose 70 - 99 mg/dL 86 111(H) 104  BUN 8 - 23 mg/dL _0 Creatinine 0.61 - 1.24 mg/dL 1.25(H) 1.28(H) 1.11  Sodium 135 - 145 mmol/L 140 140 139  Potassium 3.5 - 5.1 mmol/L 5.2(H) 4.7 4.4  Chloride 98 - 111 mmol/L 102 104 102  CO2 22 - 32 mmol/L 32 27 28  Calcium 8.9 - 10.3 mg/dL 9.2 8.8(L) 8.9  Total Protein 6.5 - 8.1 g/dL 6.8 6.3(L) 6.0(L)  Total Bilirubin 0.3 - 1.2 mg/dL 1.2 1.0 0.9  Alkaline Phos 38 - 126 U/L 135(H) 154(H) 115  AST 15 - 41 U/L _1 ALT 0 - 44 U/L _2 PATHOLOGY  03/10/2018 Molecular Pathology     03/06/2018 Cytogenetics   Diagnosis Bone Marrow, Aspirate,Biopsy, and Clot - HYPERCELLULAR BONE MARROW WITH MYELOPROLIFERATIVE NEOPLASM. - SEE COMMENT. PERIPHERAL BLOOD: - NORMOCYTIC-HYPOCHROMIC ANEMIA. - MARKED LEUKOCYTOSIS. - THROMBOCYTOSIS. Diagnosis Note The morphologic features favor chronic myeloid leukemia. Correlation with cytogenetic and FISH studies is recommended. (BNS:ecj Mar 15, 2018) Microscopic LAB DATA: CBC performed on 03/06/2018 shows: WBC 220.9 K/ul Neutrophils 32% HB 11.6 g/dl Lymphocytes 4% HCT 36.5 % Monocytes 1% MCV 89.0 fL Eosinophils 1% 1 of 3 FINAL for [name] Aguilar, [name] Aguilar CASE [phone]) Microscopic(continued) RDW 17.9 % Basophils 1% PLT 638 K/ul Band Neutrophils 22% Metamyelotcytes 3% Myelocytes 27% Promyelocytes 8% Blasts 1%  Bone Marrow count performed on 500 cells shows: Blasts: 1% Myeloid 97% Promyelocyts: 6% Myelocytes: 25% Erythroid 3% Metamyelocyts: 3% Bands: 22% Lymphocytes: 0% Neutrophils: 34% Eosinophils: 3% Plasma Cells: 0% Basophils: 3% Monocytes: 0% M:E ratio: 32.3   ECHO 03/14/18  Study Conclusions - Procedure narrative: Transthoracic echocardiography. Image   quality was poor. The study was technically difficult.   Intravenous contrast (Definity) was administered to opacify the   LV. - Left ventricle: The cavity size was mildly dilated. Wall   thickness was normal.  Systolic function was normal. The estimated   ejection fraction was in the range of 55% to 60%. Wall motion was   normal; there were no regional wall motion abnormalities. - Left atrium: The atrium was mildly dilated. - Right ventricle: The cavity size was mildly dilated. - Right atrium: The atrium was mildly dilated. - Pulmonary arteries: Systolic pressure was moderately increased.   PA peak pressure: 49 mm Hg (S). Impressions: - Definity used; normal LV systolic function; mild LVE; mild   LAE/RAE and RVE; mild TR with moderate pulmonary hypertension.    RADIOGRAPHIC STUDIES: I have personally reviewed the radiological images as listed and agreed with the findings in the report. No results found.  ASSESSMENT & PLAN:  Niklas Case Garfinkle is a 62 y.o. Caucasian male with a history of Atrial Fibrillation, Diverticulosis and Hyperlipidemia.   1. Chronic Myelogenous Leukocytosis, Chronic phase   -His initial lab from his PCP on 03/01/18 showed WBC of 208K.  -Repeat CBC on 03/03/18 showed WBC at 211.8, with predominant neutrophil with significant left shift, 14% metamyelocytes, 10% myelocytes, 3% promyelocytes, and occasional blasts (<1%).  I reviewed his peripheral blood smear myself, and I discussed with pathologist Dr. Gari Crown who reviewed his smear also.  The peripheral blood smear finding is most consistent with CML. -We reviewed the natural course of CML  and prognosis.  Although it is not curable, it's still very treatable. We have excellent targeted therapy medications for disease control.  -No hepatomegaly or splenomegaly upon initial exam.  -his bone marrow biopsy confirmed CML chronic phase, his BCR/ABL FISH was positive  -He has started to [facility] at  the end of May 2019, tolerating very well.   -He has achieved complete hematological response within the first month.  -Lab reviewed and discussed with the patient WBC 4.7K, hemoglobin 13. Anemia has resolved now.  Clinically he is doing  much better, asymptomatic now. -He has mild intermittent increased creatinine, likely related to Lasix, will continue monitoring.  - Will monitor his response with regular BCR/ABL PCR every 3 month. Today's result is penidng  -Lab and follow-up in 3 months  2. Atrial Fibrillation and ischemic congestive heart failure with EF 25-30% 02/2015 -On treatment with Eliquis, Lipitor, Lasix, lisinopril, metoprolol and Verapamil.  -Followed by Cardiologist Dr. Janit Pagan and PCP --ECHO from 03/14/18 shows his EF has improved to 55-60%  3. LE edema and wound -Secondary to CHF and leukocytosis -Much improved with diuretics and CML treatment -Superficial right lower extremity wound has completely healed   PLAN:  -continue Gleevec at 413m daily. Refilled today -Plan to follow-up in 3 months with lab   All questions were answered. The patient knows to call the clinic with any problems, questions or concerns. I spent 15 minutes counseling the patient face to face. The total time spent in the appointment was 20 minutes and more than 50% was on counseling.    IDierdre SearlesDweik am acting as scribe for Dr. YTruitt Merle  I have reviewed the above documentation for accuracy and completeness, and I agree with the above.     YTruitt Merle MD 06/07/2018

## 2018-06-07 ENCOUNTER — Inpatient Hospital Stay: Payer: 59 | Attending: Hematology | Admitting: Hematology

## 2018-06-07 ENCOUNTER — Inpatient Hospital Stay: Payer: 59

## 2018-06-07 ENCOUNTER — Encounter: Payer: Self-pay | Admitting: Hematology

## 2018-06-07 VITALS — BP 113/70 | HR 67 | Temp 98.4°F | Resp 17 | Ht 76.0 in | Wt 261.7 lb

## 2018-06-07 DIAGNOSIS — I428 Other cardiomyopathies: Secondary | ICD-10-CM

## 2018-06-07 DIAGNOSIS — I509 Heart failure, unspecified: Secondary | ICD-10-CM | POA: Insufficient documentation

## 2018-06-07 DIAGNOSIS — C9210 Chronic myeloid leukemia, BCR/ABL-positive, not having achieved remission: Secondary | ICD-10-CM | POA: Insufficient documentation

## 2018-06-07 DIAGNOSIS — I482 Chronic atrial fibrillation: Secondary | ICD-10-CM | POA: Insufficient documentation

## 2018-06-07 DIAGNOSIS — I110 Hypertensive heart disease with heart failure: Secondary | ICD-10-CM | POA: Insufficient documentation

## 2018-06-07 DIAGNOSIS — E669 Obesity, unspecified: Secondary | ICD-10-CM

## 2018-06-07 LAB — CMP (CANCER CENTER ONLY)
ALK PHOS: 135 U/L — AB (ref 38–126)
ALT: 30 U/L (ref 0–44)
ANION GAP: 6 (ref 5–15)
AST: 26 U/L (ref 15–41)
Albumin: 4 g/dL (ref 3.5–5.0)
BUN: 22 mg/dL (ref 8–23)
CALCIUM: 9.2 mg/dL (ref 8.9–10.3)
CO2: 32 mmol/L (ref 22–32)
CREATININE: 1.25 mg/dL — AB (ref 0.61–1.24)
Chloride: 102 mmol/L (ref 98–111)
GFR, Estimated: 60 mL/min — ABNORMAL LOW (ref >60)
GLUCOSE: 86 mg/dL (ref 70–99)
Potassium: 5.2 mmol/L — ABNORMAL HIGH (ref 3.5–5.1)
Sodium: 140 mmol/L (ref 135–145)
Total Bilirubin: 1.2 mg/dL (ref 0.3–1.2)
Total Protein: 6.8 g/dL (ref 6.5–8.1)

## 2018-06-07 LAB — CBC WITH DIFFERENTIAL (CANCER CENTER ONLY)
BASOS ABS: 0 10*3/uL (ref 0.0–0.1)
BASOS PCT: 0 %
Eosinophils Absolute: 0 10*3/uL (ref 0.0–0.5)
Eosinophils Relative: 0 %
HEMATOCRIT: 40.5 % (ref 38.4–49.9)
Hemoglobin: 13 g/dL (ref 13.0–17.1)
Lymphocytes Relative: 19 %
Lymphs Abs: 0.9 10*3/uL (ref 0.9–3.3)
MCH: 27.7 pg (ref 27.2–33.4)
MCHC: 32.1 g/dL (ref 32.0–36.0)
MCV: 86.4 fL (ref 79.3–98.0)
MONO ABS: 0.5 10*3/uL (ref 0.1–0.9)
MONOS PCT: 10 %
NEUTROS ABS: 3.3 10*3/uL (ref 1.5–6.5)
NEUTROS PCT: 71 %
Platelet Count: 154 10*3/uL (ref 140–400)
RBC: 4.69 MIL/uL (ref 4.20–5.82)
RDW: 16.9 % — AB (ref 11.0–14.6)
WBC Count: 4.7 10*3/uL (ref 4.0–10.3)

## 2018-06-07 MED ORDER — IMATINIB MESYLATE 400 MG PO TABS: 400.0000 mg | ORAL_TABLET | Freq: Every day | ORAL | 3 refills | Status: DC

## 2018-06-07 MED FILL — IMATINIB MESYLATE 400 MG TA: 400 | 30 days supply | Qty: 30 | Fill #0

## 2018-06-14 MED FILL — FUROSEMIDE 80 MG TAB: 80 | 90 days supply | Qty: 90 | Fill #1

## 2018-06-14 MED FILL — ATORVASTATIN 10 MG TABLET: 10 | 90 days supply | Qty: 45 | Fill #1

## 2018-06-20 MED FILL — ELIQUIS 5 MG TABLET: 5 | 30 days supply | Qty: 60 | Fill #6

## 2018-06-21 LAB — BCR/ABL

## 2018-06-28 DIAGNOSIS — H40013 Open angle with borderline findings, low risk, bilateral: Secondary | ICD-10-CM | POA: Diagnosis not present

## 2018-07-06 MED FILL — METOPROLOL SUCCINATE ER 200: 200 | 90 days supply | Qty: 135 | Fill #1

## 2018-07-06 MED FILL — FLUTICASONE PROP 50 MCG SPR: 50 | 30 days supply | Qty: 16 | Fill #1

## 2018-07-12 MED FILL — IMATINIB MESYLATE 400 MG TA: 400 | 30 days supply | Qty: 30 | Fill #1

## 2018-07-19 MED FILL — ELIQUIS 5 MG TABLET: 5 | 30 days supply | Qty: 60 | Fill #7

## 2018-08-08 MED FILL — IMATINIB MESYLATE 400 MG TA: 400 | 30 days supply | Qty: 30 | Fill #2

## 2018-08-09 MED FILL — LISINOPRIL 5 MG TABLET: 5 | 90 days supply | Qty: 90 | Fill #3

## 2018-08-16 MED FILL — FLUTICASONE PROP 50 MCG SPR: 50 | 30 days supply | Qty: 16 | Fill #2

## 2018-08-22 MED FILL — ELIQUIS 5 MG TABLET: 5 | 30 days supply | Qty: 60 | Fill #8

## 2018-09-05 MED FILL — IMATINIB MESYLATE 400 MG TA: 400 | 30 days supply | Qty: 30 | Fill #3

## 2018-09-05 NOTE — Progress Notes (Signed)
[place]  [phone]) [phone] Fax:(336) [phone]  Clinic Follow Up Note   Patient Care Team: Marin Olp, MD as PCP - General (Family Medicine) Evans Lance, MD as Consulting Physician (Cardiology)   Date of Service:  09/06/2018    CHIEF COMPLAINTS:  F/u for CML, Chronic phase    HISTORY OF PRESENTING ILLNESS:  03/03/18  [name] Aguilar 62 y.o. male is a here because of Leukocytosis. The patient was referred by his PCP Dr. Yong Channel. The patient presents to the clinic today by himself.  He has been under treatment for Afib with medication for 2 years now. In 11-09/2017 he started getting pitting edema in his legs. He planned to see his PCP sooner but finally saw him 2 days ago. His cardiologist is Dr. Janit Pagan and saw him last in 12/2017. He plans to see him back this month. Pt notes swelling with erythema and dry skin on B/l LE. He notes increased SOB with exertion. This SOB has occurred for the past 2 months.   Socially he is married with kids and works as a Animal nutritionist. He notes he had to renew his insurance for this year and had blood workup at that time early this year/late last year with no abnormal blood counts. He notes the best way to reach him is his Work phone then his home phone.  In the past the patient was diagnosed with significant comorbidities of... Pt notes he has is on medication for HTN but notes this is prophylactic given his weak heart. His past ECHO showed in 2016 showed EF at 30-35%. He denies history of blood clots. He had a cardiac cath but this was clear for blockage. His father passed form glial blastoma. His last colonoscopy was 12/24/2015 by Dr. Carlean Purl. His recent PSA was normal according to the patient. He denies history of cancer.   On review of symptoms, pt notes dry, scaly, red skin of bilateral lower extremity with old "weepiness" from skin that is now dried up. This causes itching in the skin at these locations. Pt notes  SOB upon exertion. He denies having a fever. He denies any weight loss lately.  He denies feeling any lumps, bumps or lymph nodes.   He was found to have abnormal CBC from 02/2018 He denies recent chest pain on exertion, shortness of breath on minimal exertion, pre-syncopal episodes, or palpitations. He had not noticed any recent bleeding such as epistaxis, hematuria or hematochezia The patient denies over the counter NSAID ingestion. He is on Eliquis. His last colonoscopy was 12/2015 He had no prior history or diagnosis of cancer. His age appropriate screening programs are up-to-date. He denies any pica and eats a variety of diet.  RESPONSE EVALUATION:  Complete hematological response: <1 month MMR: not achieve yet   CURRENT THERAPY: Started Gleevec 434m daily on 03/15/18.    INTERVAL HISTORY  [name] Aguilar here for a follow up. He is here alone. He is doing well. He states that his energy level is good and denies LL edema and SOB. He is tolerating treatment well. He noticed scaling and peeling on his right foot and says that he thinks it's an athletes foot and he tried OTC topical antifungals.     MEDICAL HISTORY:  Past Medical History:  Diagnosis Date  . [place]   . Diverticulosis   . Hx of adenomatous colonic polyps   . Hyperlipidemia   . Hypertension   . Vitreous anomalies 2014  Tack down of vitreous tear    SURGICAL HISTORY: Past Surgical History:  Procedure Laterality Date  . CARDIAC CATHETERIZATION N/A 04/30/2015   Procedure: Left Heart Cath and Coronary Angiography;  Surgeon: Jerline Pain, MD;  Location: [facility] [facility];  Service: Cardiovascular;  Laterality: N/A;  . CARDIOVERSION N/A 11/20/2014   Procedure: CARDIOVERSION;  Surgeon: Pixie Casino, MD;  Location: [facility] ENDOSCOPY;  Service: Cardiovascular;  Laterality: N/A;  11:38 Etomidate given IV for unsuccessful synched cardioversion @ 150 joules from Afib, 11:39 repeated synched  cardioversion @ 200 joules successfully from Afib to SR. 12 lead EKG ordered to confirm..  . COLONOSCOPY    . COLONOSCOPY N/A 12/24/2015   Procedure: COLONOSCOPY ( ANAPHALAXIS ALLERGY TO EGGS);  Surgeon: Gatha Mayer, MD;  Location: [facility];  Service: Endoscopy;  Laterality: N/A;  anaphalaxis allergy to eggs  . EYE SURGERY  2014    SOCIAL HISTORY: Social History   Socioeconomic History  . Marital status: Married    Spouse name: Not on file  . Number of children: Not on file  . Years of education: Not on file  . Highest education level: Not on file  Occupational History  . Not on file  Social Needs  . Financial resource strain: Not on file  . Food insecurity:    Worry: Not on file    Inability: Not on file  . Transportation needs:    Medical: Not on file    Non-medical: Not on file  Tobacco Use  . Smoking status: Never Smoker  . Smokeless tobacco: Never Used  Substance and Sexual Activity  . Alcohol use: Yes    Alcohol/week: 0.0 standard drinks    Comment: 1 drink once a month max  . Drug use: No  . Sexual activity: Yes  Lifestyle  . Physical activity:    Days per week: Not on file    Minutes per session: Not on file  . Stress: Not on file  Relationships  . Social connections:    Talks on phone: Not on file    Gets together: Not on file    Attends religious service: Not on file    Active member of club or organization: Not on file    Attends meetings of clubs or organizations: Not on file    Relationship status: Not on file  . Intimate partner violence:    Fear of current or ex partner: Not on file    Emotionally abused: Not on file    Physically abused: Not on file    Forced sexual activity: Not on file  Other Topics Concern  . Not on file  Social History Narrative   Married 1977. 2 children (son in doctorate program at [employer] nearing end and daughter with [employer] in [place]). No grandkids   [employer] [place]   [employer] for [employer].        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 3 dogs, 1 cat, 1 snake.      Hobbies: fishing on reservoirs in [place], family time, nature, hiking    FAMILY HISTORY: Family History  Problem Relation Age of Onset  . Asthma Mother   . Alzheimer's disease Mother        diagnosed 52  . Brain cancer Father        not genetic  . Other Brother        gilbert disease  . Heart attack  Maternal Grandmother   . Heart attack Maternal Uncle   . Colon cancer Neg Hx   . Esophageal cancer Neg Hx   . Pancreatic cancer Neg Hx   . Liver disease Neg Hx     ALLERGIES:  is allergic to eggs or egg-derived products.  MEDICATIONS:  Current Outpatient Medications  Medication Sig Dispense Refill  . atorvastatin (LIPITOR) 10 MG tablet Take 1 tablet by mouth every other day. 45 tablet 3  . ELIQUIS 5 MG TABS tablet TAKE 1 TABLET BY MOUTH 2 TIMES DAILY. 60 tablet 11  . fluticasone (FLONASE) 50 MCG/ACT nasal spray INSTILL 2 SPRAYS INTO BOTH NOSTRILS DAILY 16 g 6  . imatinib (GLEEVEC) 400 MG tablet Take 1 tablet (400 mg total) by mouth daily. Take with meals and large glass of water. Caution:Chemotherapy. 30 tablet 3  . lisinopril (PRINIVIL,ZESTRIL) 5 MG tablet Take 1 tablet (5 mg total) by mouth daily. Please keep upcoming appt for future refills. Thank you 30 tablet 11  . metoprolol (TOPROL XL) 200 MG 24 hr tablet Take 1/2 tab in the morning and 1 tab at night. 135 tablet 3  . furosemide (LASIX) 80 MG tablet Take 1 tablet (80 mg total) by mouth daily. 90 tablet 3   No current facility-administered medications for this visit.     REVIEW OF SYSTEMS:   Constitutional: Denies fevers, chills or abnormal night sweats  Eyes: Denies blurriness of vision, double vision or watery eyes Ears, nose, mouth, throat, and face: Denies mucositis or sore throat Respiratory: Denies dyspnea or wheezes Cardiovascular: Denies palpitation, chest discomfort    Gastrointestinal:  Denies nausea, heartburn or change in bowel habits Skin: Denies abnormal skin rashes  Lymphatics: Denies new lymphadenopathy or easy bruising Neurological:Denies numbness, tingling or new weaknesses Behavioral/Psych: Mood is stable, no new changes  MSK: (+) right foot scaling, fungal infection  All other systems were reviewed with the patient and are negative.  PHYSICAL EXAMINATION: ECOG PERFORMANCE STATUS: 1 - Symptomatic but completely ambulatory  Vitals:   09/06/18 0804  BP: 127/64  Pulse: (!) 104  Resp: 20  Temp: 98.7 F (37.1 C)  SpO2: 100%   Filed Weights   09/06/18 0804  Weight: 268 lb 12.8 oz (121.9 kg)    GENERAL:alert, no distress and comfortable SKIN: skin color, texture, turgor are normal, no rashes or significant lesions, previous ulcer on right LE has healed completely  EYES: normal, conjunctiva are pink and non-injected, sclera clear OROPHARYNX:no exudate, no erythema and lips, buccal mucosa, and tongue normal  NECK: supple, thyroid normal size, non-tender, without nodularity LYMPH:  no palpable lymphadenopathy in the cervical, axillary or inguinal LUNGS: clear to auscultation and percussion with normal breathing effort HEART: regular rate & rhythm and no murmurs, trace B/l lower extremity edema ABDOMEN:abdomen soft, non-tender and normal bowel sounds Musculoskeletal:no cyanosis of digits and no clubbing,  PSYCH: alert & oriented x 3 with fluent speech NEURO: no focal motor/sensory deficits   LABORATORY DATA:  I have reviewed the data as listed CBC Latest Ref Rng & Units 09/06/2018 06/07/2018 04/12/2018  WBC 4.0 - 10.5 K/uL 5.1 4.7 5.8  Hemoglobin 13.0 - 17.0 g/dL 14.4 13.0 10.9(L)  Hematocrit 39.0 - 52.0 % 44.5 40.5 33.1(L)  Platelets 150 - 400 K/uL 147(L) 154 226    CMP Latest Ref Rng & Units 09/06/2018 06/07/2018 04/12/2018  Glucose 70 - 99 mg/dL 83 86 111(H)  BUN 8 - 23 mg/dL 25(H) 22 19  Creatinine 0.61 - 1.24 mg/dL 1.51(H)  1.25(H) 1.28(H)  Sodium 135 - 145 mmol/L 143 140 140  Potassium 3.5 - 5.1 mmol/L 4.5 5.2(H) 4.7  Chloride 98 - 111 mmol/L 105 102 104  CO2 22 - 32 mmol/L 30 32 27  Calcium 8.9 - 10.3 mg/dL 9.0 9.2 8.8(L)  Total Protein 6.5 - 8.1 g/dL 6.5 6.8 6.3(L)  Total Bilirubin 0.3 - 1.2 mg/dL 1.3(H) 1.2 1.0  Alkaline Phos 38 - 126 U/L 80 135(H) 154(H)  AST 15 - 41 U/L '26 26 20  ' ALT 0 - 44 U/L '31 30 25     ' PATHOLOGY  06/07/2018 Molecular Pathology    03/10/2018 Molecular Pathology     03/06/2018 Cytogenetics   Diagnosis Bone Marrow, Aspirate,Biopsy, and Clot - HYPERCELLULAR BONE MARROW WITH MYELOPROLIFERATIVE NEOPLASM. - SEE COMMENT. PERIPHERAL BLOOD: - NORMOCYTIC-HYPOCHROMIC ANEMIA. - MARKED LEUKOCYTOSIS. - THROMBOCYTOSIS. Diagnosis Note The morphologic features favor chronic myeloid leukemia. Correlation with cytogenetic and FISH studies is recommended. (BNS:ecj Apr 03, 2018) Microscopic LAB DATA: CBC performed on 03/06/2018 shows: WBC 220.9 K/ul Neutrophils 32% HB 11.6 g/dl Lymphocytes 4% HCT 36.5 % Monocytes 1% MCV 89.0 fL Eosinophils 1% RDW 17.9 % Basophils 1% PLT 638 K/ul Band Neutrophils 22% Metamyelotcytes 3% Myelocytes 27% Promyelocytes 8% Blasts 1%  Bone Marrow count performed on 500 cells shows: Blasts: 1% Myeloid 97% Promyelocyts: 6% Myelocytes: 25% Erythroid 3% Metamyelocyts: 3% Bands: 22% Lymphocytes: 0% Neutrophils: 34% Eosinophils: 3% Plasma Cells: 0% Basophils: 3% Monocytes: 0% M:E ratio: 32.3   ECHO 03/14/18  Study Conclusions - Procedure narrative: Transthoracic echocardiography. Image   quality was poor. The study was technically difficult.   Intravenous contrast (Definity) was administered to opacify the   LV. - Left ventricle: The cavity size was mildly dilated. Wall   thickness was normal. Systolic function was normal. The estimated   ejection fraction was in the range of 55% to 60%. Wall motion was   normal; there were no regional wall  motion abnormalities. - Left atrium: The atrium was mildly dilated. - Right ventricle: The cavity size was mildly dilated. - Right atrium: The atrium was mildly dilated. - Pulmonary arteries: Systolic pressure was moderately increased.   PA peak pressure: 49 mm Hg (S). Impressions: - Definity used; normal LV systolic function; mild LVE; mild   LAE/RAE and RVE; mild TR with moderate pulmonary hypertension.    RADIOGRAPHIC STUDIES: I have personally reviewed the radiological images as listed and agreed with the findings in the report. No results found.  ASSESSMENT & PLAN:  [name] Aguilar is a 62 y.o. Caucasian male with a history of Atrial Fibrillation, Diverticulosis and Hyperlipidemia.   1. Chronic Myelogenous Leukocytosis, Chronic phase   -His initial lab from his PCP on 03/01/18 showed WBC of 208K.  -Repeat CBC on 03/03/18 showed WBC at 211.8, with predominant neutrophil with significant left shift, 14% metamyelocytes, 10% myelocytes, 3% promyelocytes, and occasional blasts (<1%).  -His pulmonary biopsy confirmed CML, chronic phase. -We reviewed the natural course of CML  and prognosis.  Although it is not curable, it's very treatable. We have excellent targeted therapy medications for disease control.  --He has started to [facility] at the end of May 2019, tolerating very well.   -He has achieved complete hematological response within the first month.  -BCR/ABL PCR in 3 months was still high with IS 17%, suboptimal  -Clinically he is doing very wekk, asymptomatic now. - Will monitor his response with regular BCR/ABL PCR every 3 month. Today's result is pending. If still >10%, consider genetic mutation test  and change therapy   -Lab and follow-up in 3 months  2. Atrial Fibrillation and ischemic congestive heart failure with EF 25-30% 02/2015 -On treatment with Eliquis, Lipitor, Lasix, lisinopril, metoprolol and Verapamil.  -Followed by Cardiologist Dr. Janit Pagan and PCP --ECHO from  03/14/18 shows his EF has improved to 55-60%  3. LE edema and wound -Secondary to CHF and leukocytosis -Much improved with diuretics and CML treatment -Superficial right lower extremity wound has completely healed   PLAN:  -continue Gleevec at 477m daily.  -I will call with BCR/ABL labs results in a few weeks  -Plan to follow-up in 3 months with lab   All questions were answered. The patient knows to call the clinic with any problems, questions or concerns. I spent 15 minutes counseling the patient face to face. The total time spent in the appointment was 20 minutes and more than 50% was on counseling.    IDierdre SearlesDweik am acting as scribe for Dr. YTruitt Merle  I have reviewed the above documentation for accuracy and completeness, and I agree with the above.     YTruitt Merle MD 09/06/2018

## 2018-09-06 ENCOUNTER — Inpatient Hospital Stay: Payer: 59 | Attending: Hematology | Admitting: Hematology

## 2018-09-06 ENCOUNTER — Inpatient Hospital Stay: Payer: 59

## 2018-09-06 ENCOUNTER — Encounter: Payer: Self-pay | Admitting: Hematology

## 2018-09-06 ENCOUNTER — Telehealth: Payer: Self-pay

## 2018-09-06 VITALS — BP 127/64 | HR 104 | Temp 98.7°F | Resp 20 | Ht 76.0 in | Wt 268.8 lb

## 2018-09-06 DIAGNOSIS — Z7901 Long term (current) use of anticoagulants: Secondary | ICD-10-CM | POA: Insufficient documentation

## 2018-09-06 DIAGNOSIS — I4891 Unspecified atrial fibrillation: Secondary | ICD-10-CM | POA: Insufficient documentation

## 2018-09-06 DIAGNOSIS — C9210 Chronic myeloid leukemia, BCR/ABL-positive, not having achieved remission: Secondary | ICD-10-CM

## 2018-09-06 DIAGNOSIS — E785 Hyperlipidemia, unspecified: Secondary | ICD-10-CM | POA: Insufficient documentation

## 2018-09-06 DIAGNOSIS — E669 Obesity, unspecified: Secondary | ICD-10-CM

## 2018-09-06 DIAGNOSIS — I428 Other cardiomyopathies: Secondary | ICD-10-CM

## 2018-09-06 DIAGNOSIS — R0602 Shortness of breath: Secondary | ICD-10-CM | POA: Diagnosis not present

## 2018-09-06 DIAGNOSIS — I110 Hypertensive heart disease with heart failure: Secondary | ICD-10-CM | POA: Diagnosis not present

## 2018-09-06 LAB — CBC WITH DIFFERENTIAL (CANCER CENTER ONLY)
Abs Immature Granulocytes: 0.01 10*3/uL (ref 0.00–0.07)
BASOS PCT: 0 %
Basophils Absolute: 0 10*3/uL (ref 0.0–0.1)
EOS ABS: 0.1 10*3/uL (ref 0.0–0.5)
Eosinophils Relative: 1 %
HCT: 44.5 % (ref 39.0–52.0)
Hemoglobin: 14.4 g/dL (ref 13.0–17.0)
IMMATURE GRANULOCYTES: 0 %
Lymphocytes Relative: 22 %
Lymphs Abs: 1.1 10*3/uL (ref 0.7–4.0)
MCH: 28.3 pg (ref 26.0–34.0)
MCHC: 32.4 g/dL (ref 30.0–36.0)
MCV: 87.6 fL (ref 80.0–100.0)
MONOS PCT: 10 %
Monocytes Absolute: 0.5 10*3/uL (ref 0.1–1.0)
NEUTROS PCT: 67 %
NRBC: 0 % (ref 0.0–0.2)
Neutro Abs: 3.4 10*3/uL (ref 1.7–7.7)
PLATELETS: 147 10*3/uL — AB (ref 150–400)
RBC: 5.08 MIL/uL (ref 4.22–5.81)
RDW: 20.1 % — AB (ref 11.5–15.5)
WBC: 5.1 10*3/uL (ref 4.0–10.5)

## 2018-09-06 LAB — CMP (CANCER CENTER ONLY)
ALT: 31 U/L (ref 0–44)
AST: 26 U/L (ref 15–41)
Albumin: 3.9 g/dL (ref 3.5–5.0)
Alkaline Phosphatase: 80 U/L (ref 38–126)
Anion gap: 8 (ref 5–15)
BUN: 25 mg/dL — ABNORMAL HIGH (ref 8–23)
CHLORIDE: 105 mmol/L (ref 98–111)
CO2: 30 mmol/L (ref 22–32)
Calcium: 9 mg/dL (ref 8.9–10.3)
Creatinine: 1.51 mg/dL — ABNORMAL HIGH (ref 0.61–1.24)
GFR, EST AFRICAN AMERICAN: 55 mL/min — AB (ref >60)
GFR, EST NON AFRICAN AMERICAN: 47 mL/min — AB (ref >60)
Glucose, Bld: 83 mg/dL (ref 70–99)
POTASSIUM: 4.5 mmol/L (ref 3.5–5.1)
Sodium: 143 mmol/L (ref 135–145)
Total Bilirubin: 1.3 mg/dL — ABNORMAL HIGH (ref 0.3–1.2)
Total Protein: 6.5 g/dL (ref 6.5–8.1)

## 2018-09-06 NOTE — Telephone Encounter (Signed)
Printed avs and calender of upcoming appointment. Per 11/20 los 

## 2018-09-12 MED FILL — FUROSEMIDE 80 MG TAB: 80 | 90 days supply | Qty: 90 | Fill #2

## 2018-09-12 MED FILL — ATORVASTATIN 10 MG TABLET: 10 | 90 days supply | Qty: 45 | Fill #2

## 2018-09-13 ENCOUNTER — Telehealth: Payer: Self-pay

## 2018-09-13 NOTE — Telephone Encounter (Signed)
Left voice message on patient's home phone letting him know per Dr. Charlean Sanfilippo - BCR/ABL1 is coming down.  Per Dr. Burr Medico continue current dose of Gleevec.

## 2018-09-19 MED FILL — FLUTICASONE PROP 50 MCG SPR: 50 | 30 days supply | Qty: 16 | Fill #3

## 2018-09-19 MED FILL — ELIQUIS 5 MG TABLET: 5 | 30 days supply | Qty: 60 | Fill #9

## 2018-09-26 LAB — BCR/ABL

## 2018-09-27 ENCOUNTER — Other Ambulatory Visit: Payer: Self-pay | Admitting: Hematology

## 2018-09-27 DIAGNOSIS — C9210 Chronic myeloid leukemia, BCR/ABL-positive, not having achieved remission: Secondary | ICD-10-CM

## 2018-10-04 MED FILL — METOPROLOL SUCCINATE ER 200: 200 | 90 days supply | Qty: 135 | Fill #2

## 2018-10-09 MED FILL — IMATINIB MESYLATE 400 MG TA: 400 | 30 days supply | Qty: 30 | Fill #0

## 2018-10-12 ENCOUNTER — Encounter: Payer: Self-pay | Admitting: Internal Medicine

## 2018-10-17 MED FILL — ELIQUIS 5 MG TABLET: 5 | 30 days supply | Qty: 60 | Fill #10

## 2018-11-01 ENCOUNTER — Encounter (INDEPENDENT_AMBULATORY_CARE_PROVIDER_SITE_OTHER): Payer: 59 | Admitting: Ophthalmology

## 2018-11-01 DIAGNOSIS — H35033 Hypertensive retinopathy, bilateral: Secondary | ICD-10-CM | POA: Diagnosis not present

## 2018-11-01 DIAGNOSIS — H35372 Puckering of macula, left eye: Secondary | ICD-10-CM | POA: Diagnosis not present

## 2018-11-01 DIAGNOSIS — D3132 Benign neoplasm of left choroid: Secondary | ICD-10-CM

## 2018-11-01 DIAGNOSIS — H33301 Unspecified retinal break, right eye: Secondary | ICD-10-CM

## 2018-11-01 DIAGNOSIS — I10 Essential (primary) hypertension: Secondary | ICD-10-CM | POA: Diagnosis not present

## 2018-11-01 DIAGNOSIS — H43813 Vitreous degeneration, bilateral: Secondary | ICD-10-CM

## 2018-11-01 DIAGNOSIS — H353111 Nonexudative age-related macular degeneration, right eye, early dry stage: Secondary | ICD-10-CM | POA: Diagnosis not present

## 2018-11-01 DIAGNOSIS — H3563 Retinal hemorrhage, bilateral: Secondary | ICD-10-CM

## 2018-11-07 MED FILL — LISINOPRIL 5 MG TABLET: 5 | 90 days supply | Qty: 90 | Fill #4

## 2018-11-07 MED FILL — IMATINIB MESYLATE 400 MG TA: 400 | 30 days supply | Qty: 30 | Fill #1

## 2018-11-08 ENCOUNTER — Ambulatory Visit (INDEPENDENT_AMBULATORY_CARE_PROVIDER_SITE_OTHER): Payer: 59 | Admitting: Internal Medicine

## 2018-11-08 ENCOUNTER — Ambulatory Visit: Payer: 59 | Admitting: Internal Medicine

## 2018-11-08 ENCOUNTER — Encounter: Payer: Self-pay | Admitting: Internal Medicine

## 2018-11-08 VITALS — BP 104/68 | HR 88 | Ht 76.0 in | Wt 277.4 lb

## 2018-11-08 DIAGNOSIS — I428 Other cardiomyopathies: Secondary | ICD-10-CM | POA: Diagnosis not present

## 2018-11-08 DIAGNOSIS — I4819 Other persistent atrial fibrillation: Secondary | ICD-10-CM | POA: Diagnosis not present

## 2018-11-08 NOTE — Patient Instructions (Signed)

## 2018-11-08 NOTE — Progress Notes (Signed)
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 When I saw him last he was volume overloaded. He had not had syncope. He feels much better after adjustment of his meds. His EF normalized at his last echo. He has tolerated the meds for CML. He denies syncope.  Allergies  Allergen Reactions  . Eggs Or Egg-Derived Products Anaphylaxis    REACTION: no vaccines     Current Outpatient Medications  Medication Sig Dispense Refill  . atorvastatin (LIPITOR) 10 MG tablet Take 1 tablet by mouth every other day. 45 tablet 3  . ELIQUIS 5 MG TABS tablet TAKE 1 TABLET BY MOUTH 2 TIMES DAILY. 60 tablet 11  . fluticasone (FLONASE) 50 MCG/ACT nasal spray INSTILL 2 SPRAYS INTO BOTH NOSTRILS DAILY 16 g 6  . imatinib (GLEEVEC) 400 MG tablet TAKE 1 TABLET (400 MG TOTAL) BY MOUTH DAILY. TAKE WITH MEALS AND LARGE GLASS OF WATER. CAUTION:CHEMOTHERAPY. 30 tablet 3  . lisinopril (PRINIVIL,ZESTRIL) 5 MG tablet Take 1 tablet (5 mg total) by mouth daily. Please keep upcoming appt for future refills. Thank you 30 tablet 11  . metoprolol (TOPROL XL) 200 MG 24 hr tablet Take 1/2 tab in the morning and 1 tab at night. 135 tablet 3  . furosemide (LASIX) 80 MG tablet Take 1 tablet (80 mg total) by mouth daily. 90 tablet 3   No current facility-administered medications for this visit.      Past Medical History:  Diagnosis Date  . Atrial fibrillation ([place])   . Diverticulosis   . Hx of adenomatous colonic polyps   . Hyperlipidemia   . Hypertension   . Vitreous anomalies 2014   Tack down of vitreous tear    ROS:   All systems reviewed and negative except as noted in the HPI.   Past Surgical History:  Procedure Laterality Date  . CARDIAC CATHETERIZATION N/A 04/30/2015   Procedure: Left Heart Cath and Coronary Angiography;  Surgeon: Jerline Pain, MD;  Location: [place] [facility];  Service: Cardiovascular;  Laterality: N/A;  . CARDIOVERSION N/A 11/20/2014     Procedure: CARDIOVERSION;  Surgeon: Pixie Casino, MD;  Location: [facility] ENDOSCOPY;  Service: Cardiovascular;  Laterality: N/A;  11:38 Etomidate given IV for unsuccessful synched cardioversion @ 150 joules from Afib, 11:39 repeated synched cardioversion @ 200 joules successfully from Afib to SR. 12 lead EKG ordered to confirm..  . COLONOSCOPY    . COLONOSCOPY N/A 12/24/2015   Procedure: COLONOSCOPY ( ANAPHALAXIS ALLERGY TO EGGS);  Surgeon: Gatha Mayer, MD;  Location: [facility];  Service: Endoscopy;  Laterality: N/A;  anaphalaxis allergy to eggs  . EYE SURGERY  2014     Family History  Problem Relation Age of Onset  . Asthma Mother   . Alzheimer's disease Mother        diagnosed 49  . Brain cancer Father        not genetic  . Other Brother        gilbert disease  . Heart attack Maternal Grandmother   . Heart attack Maternal Uncle   . Colon cancer Neg Hx   . Esophageal cancer Neg Hx   . Pancreatic cancer Neg Hx   . Liver disease Neg Hx      Social History   Socioeconomic History  . Marital status: Married    Spouse name: Not on file  . Number of children: Not on file  . Years of  education: Not on file  . Highest education level: Not on file  Occupational History  . Not on file  Social Needs  . Financial resource strain: Not on file  . Food insecurity:    Worry: Not on file    Inability: Not on file  . Transportation needs:    Medical: Not on file    Non-medical: Not on file  Tobacco Use  . Smoking status: Never Smoker  . Smokeless tobacco: Never Used  Substance and Sexual Activity  . Alcohol use: Yes    Alcohol/week: 0.0 standard drinks    Comment: 1 drink once a month max  . Drug use: No  . Sexual activity: Yes  Lifestyle  . Physical activity:    Days per week: Not on file    Minutes per session: Not on file  . Stress: Not on file  Relationships  . Social connections:    Talks on phone: Not on file    Gets together: Not on file    Attends religious  service: Not on file    Active member of club or organization: Not on file    Attends meetings of clubs or organizations: Not on file    Relationship status: Not on file  . Intimate partner violence:    Fear of current or ex partner: Not on file    Emotionally abused: Not on file    Physically abused: Not on file    Forced sexual activity: Not on file  Other Topics Concern  . Not on file  Social History Narrative   Married 1977. 2 children (son in doctorate program at [employer] nearing end and daughter with [employer] in [place]). 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 3 dogs, 1 cat, 1 snake.      Hobbies: fishing on reservoirs in [place], family time, nature, hiking     BP 104/68   Pulse 88   Ht 6\' 4"  (1.93 m)   Wt 277 lb 6.4 oz (125.8 kg)   SpO2 99%   BMI 33.77 kg/m   Physical Exam:  Well appearing NAD HEENT: Unremarkable Neck:  6 cm JVD, no thyromegally Lymphatics:  No adenopathy Back:  No CVA tenderness Lungs:  Clear with no wheezes HEART:  Regular rate rhythm, no murmurs, no rubs, no clicks Abd:  soft, positive bowel sounds, no organomegally, no rebound, no guarding Ext:  2 plus pulses, no edema, no cyanosis, no clubbing Skin:  No rashes no nodules Neuro:  CN II through XII intact, motor grossly intact  EKG - atrial fib with a controlled VR   Assess/Plan: 1. Atrial fib - his rates are controlled. He is asymptomatic with regard to palpitations. 2. Chronic systolic heart failure - his EF had normalized with his last echo. He will continue his beta blocker and Ace inhibitor. 3. Dyslipidemia - he will continue lipitor. 4. Obesity - is encouraged him to lose weight. He is up 14 lbs since his last visit.  Mikle Bosworth.D.

## 2018-11-15 MED FILL — FLUTICASONE PROP 50 MCG SPR: 50 | 30 days supply | Qty: 16 | Fill #4

## 2018-11-23 MED FILL — ELIQUIS 5 MG TABLET: 5 | 30 days supply | Qty: 60 | Fill #11

## 2018-12-04 NOTE — Progress Notes (Signed)
[facility]   [phone] [phone]   Clinic Follow up Note   Patient Care Team: Hunter, Stephen O, MD as PCP - General (Family Medicine) Taylor, Gregg W, MD as Consulting Physician (Cardiology)  Date of Service:  12/06/2018  CHIEF COMPLAINT: F/u for CML, Chronic phase   RESPONSE EVALUATION:  Complete hematological response: <1 month MMR: not achieve yet   Bcr/abl IS 03/10/18: 41.28% 06/07/18: 16.96% 09/06/18: 3.78%  CURRENT THERAPY:  Started Gleevec 400mg daily on 03/15/18.   INTERVAL HISTORY:  [name] Aguilar is here for a follow up of CML. He presents to the clinic today by himself. He notes he is doing well. He notes he has gained weight from eating and denies any SOB. I reviewed his medication list with him, no major changes have been made. He is on Lasix 80mg daily. He saw his cardiologist recently and will see him again in a year.      REVIEW OF SYSTEMS:   Constitutional: Denies fevers, chills or abnormal weight loss Eyes: Denies blurriness of vision Ears, nose, mouth, throat, and face: Denies mucositis or sore throat Respiratory: Denies cough, dyspnea or wheezes Cardiovascular: Denies palpitation, chest discomfort or lower extremity swelling Gastrointestinal:  Denies nausea, heartburn or change in bowel habits Skin: Denies abnormal skin rashes Lymphatics: Denies new lymphadenopathy or easy bruising Neurological:Denies numbness, tingling or new weaknesses Behavioral/Psych: Mood is stable, no new changes  All other systems were reviewed with the patient and are negative.  MEDICAL HISTORY:  Past Medical History:  Diagnosis Date  . Atrial fibrillation (HCC)   . Diverticulosis   . Hx of adenomatous colonic polyps   . Hyperlipidemia   . Hypertension   . Vitreous anomalies 2014   Tack down of vitreous tear    SURGICAL HISTORY: Past Surgical History:  Procedure Laterality Date  . CARDIAC CATHETERIZATION N/A 04/30/2015   Procedure: Left Heart Cath and Coronary Angiography;  Surgeon: Kebin C Skains, MD;  Location: [facility]AS[facility];  Service: Cardiovascular;  Laterality: N/A;  . CARDIOVERSION N/A 11/20/2014   Procedure: CARDIOVERSION;  Surgeon: Kenneth C. Hilty, MD;  Location: [facility] ENDOSCOPY;  Service: Cardiovascular;  Laterality: N/A;  11:38 Etomidate given IV for unsuccessful synched cardioversion @ 150 joules from Afib, 11:39 repeated synched cardioversion @ 200 joules successfully from Afib to SR. 12 lead EKG ordered to confirm..  . COLONOSCOPY    . COLONOSCOPY N/A 12/24/2015   Procedure: COLONOSCOPY ( ANAPHALAXIS ALLERGY TO EGGS);  Surgeon: Carl E Gessner, MD;  Location: [facility] ENDOSCOPY;  Service: Endoscopy;  Laterality: N/A;  anaphalaxis allergy to eggs  . EYE SURGERY  2014    I have reviewed the social history and family history with the patient and they are unchanged from previous note.  ALLERGIES:  is allergic to eggs or egg-derived products.  MEDICATIONS:  Current Outpatient Medications  Medication Sig Dispense Refill  . atorvastatin (LIPITOR) 10 MG tablet Take 1 tablet by mouth every other day. 45 tablet 3  . ELIQUIS 5 MG TABS tablet TAKE 1 TABLET BY MOUTH 2 TIMES DAILY. 60 tablet 11  . fluticasone (FLONASE) 50 MCG/ACT nasal spray INSTILL 2 SPRAYS INTO BOTH NOSTRILS DAILY 16 g 6  . imatinib (GLEEVEC) 400 MG tablet TAKE 1 TABLET (400 MG TOTAL) BY MOUTH DAILY. TAKE WITH MEALS AND LARGE GLASS OF WATER. CAUTION:CHEMOTHERAPY. 30 tablet 3  . lisinopril (PRINIVIL,ZESTRIL) 5 MG tablet Take 1 tablet (5 mg total) by mouth daily. Please keep upcoming appt for future   refills. Thank you 30 tablet 11  . metoprolol (TOPROL XL) 200 MG 24 hr tablet Take 1/2 tab in the morning and 1 tab at night. 135 tablet 3  . furosemide (LASIX) 80 MG tablet Take 1 tablet (80 mg total) by mouth daily. 90 tablet 3   No current facility-administered medications for this visit.     PHYSICAL EXAMINATION: ECOG PERFORMANCE STATUS: 0 -  Asymptomatic  Vitals:   12/06/18 0811  BP: 102/63  Pulse: 60  Resp: 20  Temp: 98.5 F (36.9 C)  SpO2: 98%   Filed Weights   12/06/18 0811  Weight: 283 lb (128.4 kg)    GENERAL:alert, no distress and comfortable SKIN: skin color, texture, turgor are normal, no rashes or significant lesions  EYES: normal, Conjunctiva are pink and non-injected, sclera clear OROPHARYNX:no exudate, no erythema and lips, buccal mucosa, and tongue normal  NECK: supple, thyroid normal size, non-tender, without nodularity LYMPH:  no palpable lymphadenopathy in the cervical, axillary or inguinal LUNGS: clear to auscultation and percussion with normal breathing effort HEART: regular rate & rhythm and no murmurs (+) Mild lower extremity edema ABDOMEN:abdomen soft, non-tender and normal bowel sounds (+) No organomegaly.  Musculoskeletal:no cyanosis of digits and no clubbing  NEURO: alert & oriented x 3 with fluent speech, no focal motor/sensory deficits  LABORATORY DATA:  I have reviewed the data as listed CBC Latest Ref Rng & Units 12/06/2018 09/06/2018 06/07/2018  WBC 4.0 - 10.5 K/uL 4.7 5.1 4.7  Hemoglobin 13.0 - 17.0 g/dL 14.8 14.4 13.0  Hematocrit 39.0 - 52.0 % 44.1 44.5 40.5  Platelets 150 - 400 K/uL 149(L) 147(L) 154     CMP Latest Ref Rng & Units 12/06/2018 09/06/2018 06/07/2018  Glucose 70 - 99 mg/dL 99 83 86  BUN 8 - 23 mg/dL 21 25(H) 22  Creatinine 0.61 - 1.24 mg/dL 1.49(H) 1.51(H) 1.25(H)  Sodium 135 - 145 mmol/L 141 143 140  Potassium 3.5 - 5.1 mmol/L 4.8 4.5 5.2(H)  Chloride 98 - 111 mmol/L 104 105 102  CO2 22 - 32 mmol/L 30 30 32  Calcium 8.9 - 10.3 mg/dL 8.9 9.0 9.2  Total Protein 6.5 - 8.1 g/dL 6.3(L) 6.5 6.8  Total Bilirubin 0.3 - 1.2 mg/dL 0.9 1.3(H) 1.2  Alkaline Phos 38 - 126 U/L 69 80 135(H)  AST 15 - 41 U/L 25 26 26  ALT 0 - 44 U/L 30 31 30    RADIOGRAPHIC STUDIES: I have personally reviewed the radiological images as listed and agreed with the findings in the report. No  results found.   ASSESSMENT & PLAN:  [name] is a 63 y.o. male with   1. Chronic Myelogenous Leukocytosis, Chronic phase   -He was diagnosed in May 2019.  -He has started Gleevec at the end of May 2019, tolerating very well.   -He had achieved complete hematological response within the first month.  -He is clinically doing well, asymptomatic. Labs reviewed, CBC WNL except PLT at 149K, related to Gleevec. CMP WNL except Cr at 1.49, total protein at 6.3. bcr/abl is still pending. Physical exam was unremarkable.  -His bcr/able has been trending down but still detectable on last visit. Continue Gleevec. If complete molecular remission has not been reached in 1 year on treatment will do mutation testing to determine moving forward with second generation TKI. - Will monitor his response with regular BCR/ABL PCR every 3 month.  -Lab and follow-up in 3 months  2. Atrial Fibrillation and ischemic congestive heart   failure with EF 25-30% 02/2015 -On treatment with Eliquis, Lipitor, Lasix, lisinopril, metoprolol and Verapamil.  -Followed by Cardiologist Dr. Glenn Taylor and PCP -Last ECHO from 03/14/18 shows his EF has improved to 55-60%   3. LE edema and wound  -Secondary to CHF and leukocytosis -Much improved with diuretics and CML treatment -Superficial right lower extremity wound has completely healed  -Mild LE edema on exam today (12/06/18)  PLAN:  -continue Gleevec at 400mg daily.  -will call him next week with the BCR/ABL result  -Lab and f/u in 3 months    No problem-specific Assessment & Plan notes found for this encounter.   No orders of the defined types were placed in this encounter.  All questions were answered. The patient knows to call the clinic with any problems, questions or concerns. No barriers to learning was detected. I spent 15 minutes counseling the patient face to face. The total time spent in the appointment was 20 minutes and more than 50% was on  counseling and review of test results     Yan Feng, MD 12/06/2018   I, Amoya Bennett, am acting as scribe for Yan Feng, MD.   I have reviewed the above documentation for accuracy and completeness, and I agree with the above.       

## 2018-12-05 MED FILL — FUROSEMIDE 80 MG TAB: 80 | 90 days supply | Qty: 90 | Fill #3

## 2018-12-05 MED FILL — ATORVASTATIN 10 MG TABLET: 10 | 90 days supply | Qty: 45 | Fill #3

## 2018-12-05 MED FILL — IMATINIB MESYLATE 400 MG TA: 400 | 30 days supply | Qty: 30 | Fill #2

## 2018-12-06 ENCOUNTER — Telehealth: Payer: Self-pay | Admitting: Hematology

## 2018-12-06 ENCOUNTER — Inpatient Hospital Stay (HOSPITAL_BASED_OUTPATIENT_CLINIC_OR_DEPARTMENT_OTHER): Payer: 59 | Admitting: Hematology

## 2018-12-06 ENCOUNTER — Inpatient Hospital Stay: Payer: 59 | Attending: Hematology

## 2018-12-06 ENCOUNTER — Encounter: Payer: Self-pay | Admitting: Hematology

## 2018-12-06 VITALS — BP 102/63 | HR 60 | Temp 98.5°F | Resp 20 | Ht 76.0 in | Wt 283.0 lb

## 2018-12-06 DIAGNOSIS — Z7901 Long term (current) use of anticoagulants: Secondary | ICD-10-CM | POA: Diagnosis not present

## 2018-12-06 DIAGNOSIS — C9210 Chronic myeloid leukemia, BCR/ABL-positive, not having achieved remission: Secondary | ICD-10-CM

## 2018-12-06 DIAGNOSIS — I509 Heart failure, unspecified: Secondary | ICD-10-CM

## 2018-12-06 DIAGNOSIS — I4891 Unspecified atrial fibrillation: Secondary | ICD-10-CM

## 2018-12-06 LAB — CMP (CANCER CENTER ONLY)
ALT: 30 U/L (ref 0–44)
ANION GAP: 7 (ref 5–15)
AST: 25 U/L (ref 15–41)
Albumin: 3.8 g/dL (ref 3.5–5.0)
Alkaline Phosphatase: 69 U/L (ref 38–126)
BUN: 21 mg/dL (ref 8–23)
CHLORIDE: 104 mmol/L (ref 98–111)
CO2: 30 mmol/L (ref 22–32)
Calcium: 8.9 mg/dL (ref 8.9–10.3)
Creatinine: 1.49 mg/dL — ABNORMAL HIGH (ref 0.61–1.24)
GFR, EST AFRICAN AMERICAN: 57 mL/min — AB (ref >60)
GFR, EST NON AFRICAN AMERICAN: 49 mL/min — AB (ref >60)
Glucose, Bld: 99 mg/dL (ref 70–99)
POTASSIUM: 4.8 mmol/L (ref 3.5–5.1)
Sodium: 141 mmol/L (ref 135–145)
Total Bilirubin: 0.9 mg/dL (ref 0.3–1.2)
Total Protein: 6.3 g/dL — ABNORMAL LOW (ref 6.5–8.1)

## 2018-12-06 LAB — CBC WITH DIFFERENTIAL (CANCER CENTER ONLY)
ABS IMMATURE GRANULOCYTES: 0.01 10*3/uL (ref 0.00–0.07)
BASOS PCT: 0 %
Basophils Absolute: 0 10*3/uL (ref 0.0–0.1)
Eosinophils Absolute: 0.1 10*3/uL (ref 0.0–0.5)
Eosinophils Relative: 2 %
HCT: 44.1 % (ref 39.0–52.0)
HEMOGLOBIN: 14.8 g/dL (ref 13.0–17.0)
Immature Granulocytes: 0 %
LYMPHS PCT: 23 %
Lymphs Abs: 1.1 10*3/uL (ref 0.7–4.0)
MCH: 32.6 pg (ref 26.0–34.0)
MCHC: 33.6 g/dL (ref 30.0–36.0)
MCV: 97.1 fL (ref 80.0–100.0)
MONO ABS: 0.4 10*3/uL (ref 0.1–1.0)
Monocytes Relative: 9 %
NEUTROS ABS: 3.1 10*3/uL (ref 1.7–7.7)
NRBC: 0 % (ref 0.0–0.2)
Neutrophils Relative %: 66 %
PLATELETS: 149 10*3/uL — AB (ref 150–400)
RBC: 4.54 MIL/uL (ref 4.22–5.81)
RDW: 13.8 % (ref 11.5–15.5)
WBC: 4.7 10*3/uL (ref 4.0–10.5)

## 2018-12-06 NOTE — Telephone Encounter (Signed)
Gave patient avs report and appointments for May  °

## 2018-12-14 ENCOUNTER — Telehealth: Payer: Self-pay | Admitting: Hematology

## 2018-12-14 NOTE — Telephone Encounter (Signed)
I called patient, and left a message about his recent BCR/ABL level, which was 1.79%. it's coming down nicely. I recommend him to continue Gann.   Truitt Merle  12/14/2018

## 2018-12-15 LAB — BCR/ABL

## 2018-12-20 ENCOUNTER — Other Ambulatory Visit: Payer: Self-pay | Admitting: Family Medicine

## 2018-12-20 MED FILL — FLUTICASONE PROP 50 MCG SPR: 50 | 30 days supply | Qty: 16 | Fill #5

## 2018-12-21 MED FILL — ELIQUIS 5 MG TABLET: 5 | 30 days supply | Qty: 60 | Fill #0

## 2019-01-02 MED FILL — IMATINIB MESYLATE 400 MG TA: 400 | 30 days supply | Qty: 30 | Fill #3

## 2019-01-03 MED FILL — METOPROLOL SUCCINATE ER 200: 200 | 90 days supply | Qty: 135 | Fill #3

## 2019-01-17 MED FILL — ELIQUIS 5 MG TABLET: 5 | 30 days supply | Qty: 60 | Fill #1

## 2019-01-22 ENCOUNTER — Other Ambulatory Visit: Payer: Self-pay | Admitting: Hematology

## 2019-01-22 DIAGNOSIS — C9210 Chronic myeloid leukemia, BCR/ABL-positive, not having achieved remission: Secondary | ICD-10-CM

## 2019-01-23 ENCOUNTER — Other Ambulatory Visit: Payer: Self-pay | Admitting: Pharmacist

## 2019-01-31 MED FILL — IMATINIB MESYLATE 400 MG TA: 400 | 30 days supply | Qty: 30 | Fill #0

## 2019-01-31 MED FILL — FLUTICASONE PROP 50 MCG SPR: 50 | 30 days supply | Qty: 16 | Fill #6

## 2019-02-05 ENCOUNTER — Other Ambulatory Visit: Payer: Self-pay | Admitting: Family Medicine

## 2019-02-05 MED FILL — LISINOPRIL 5 MG TABLET: 5 | 90 days supply | Qty: 90 | Fill #0

## 2019-02-16 MED FILL — ELIQUIS 5 MG TABLET: 5 | 30 days supply | Qty: 60 | Fill #2

## 2019-03-01 ENCOUNTER — Telehealth: Payer: Self-pay

## 2019-03-01 MED FILL — IMATINIB MESYLATE 400 MG TA: 400 | 30 days supply | Qty: 30 | Fill #1

## 2019-03-01 NOTE — Telephone Encounter (Signed)
Oral Oncology Patient Advocate Encounter  Prior Authorization for Imatinib has been approved.    PA# [id number] Effective dates: 03/01/19 through 02/28/20  Oral Oncology Clinic will continue to follow.   [place] Patient [place] Phone [phone] Fax [phone] 03/01/2019    10:05 AM

## 2019-03-01 NOTE — Telephone Encounter (Signed)
Oral Oncology Patient Advocate Encounter  Received notification from [place] that prior authorization for Imatinib is required.  PA submitted on CoverMyMeds Key [id number] Status is pending  Oral Oncology Clinic will continue to follow.  [place] Patient [place] Phone [phone] Fax [phone] 03/01/2019    8:52 AM

## 2019-03-05 NOTE — Progress Notes (Signed)
[facility]   [phone] [phone]   Clinic Follow up Note   Patient Care Team: Marin Olp, MD as PCP - General (Family Medicine) Evans Lance, MD as Consulting Physician (Cardiology)  Date of Service:  03/07/2019  CHIEF COMPLAINT: F/u for CML, Chronic phase   RESPONSE EVALUATION:  Complete hematological response: <1 month MMR: not achieve yet  Bcr/abl IS 03/10/18: 41.28% 06/07/18: 16.96% 09/06/18: 3.78% 12/06/18: 1.7953% 03/07/19: PENDING   CURRENT THERAPY:  StartedGleevec 43m daily on 03/15/18.   INTERVAL HISTORY:  [name] Aguilar here for a follow up of CML. He presents to the clinic today by himself. He notes he is doing well. He is tolerating Gleevec well. He notes he continues to follow up with his ophthalmologists and his eye condition is stable. He notes no change in vision. He notes he still uses his lasix. He still uses 88mand his LE edema has improved. I reviewed his medication list with him. He denies SOB or cough. He has been able to tolerate exercise and yard work. He can be at work for half an hour before needing to sit down for a break. He notes having a prior skin boil over his right clavicle which has now overall resolved.     REVIEW OF SYSTEMS:   Constitutional: Denies fevers, chills or abnormal weight loss Eyes: Denies blurriness of vision Ears, nose, mouth, throat, and face: Denies mucositis or sore throat Respiratory: Denies cough, dyspnea or wheezes Cardiovascular: Denies palpitation, chest discomfort or lower extremity swelling Gastrointestinal:  Denies nausea, heartburn or change in bowel habits Skin: Denies abnormal skin rashes Lymphatics: Denies new lymphadenopathy or easy bruising Neurological:Denies numbness, tingling or new weaknesses Behavioral/Psych: Mood is stable, no new changes  All other systems were reviewed with the patient and are negative.  MEDICAL HISTORY:  Past Medical  History:  Diagnosis Date  . [place]rial fibrillation ([facility]  . Diverticulosis   . Hx of adenomatous colonic polyps   . Hyperlipidemia   . Hypertension   . Vitreous anomalies 2014   Tack down of vitreous tear    SURGICAL HISTORY: Past Surgical History:  Procedure Laterality Date  . CARDIAC CATHETERIZATION N/A 04/30/2015   Procedure: Left Heart Cath and Coronary Angiography;  Surgeon: MaJerline PainMD;  [facility];  Service: Cardiovascular;  Laterality: N/A;  . CARDIOVERSION N/A 11/20/2014   Procedure: CARDIOVERSION;  Surgeon: KePixie CasinoMD;  [facility];  Service: Cardiovascular;  Laterality: N/A;  11:38 Etomidate given IV for unsuccessful synched cardioversion @ 150 joules from Afib, 11:39 repeated synched cardioversion @ 200 joules successfully from Afib to SR. 12 lead EKG ordered to confirm..  . COLONOSCOPY    . COLONOSCOPY N/A 12/24/2015   Procedure: COLONOSCOPY ( ANAPHALAXIS ALLERGY TO EGGS);  Surgeon: CaGatha MayerMD;  [facility];  Service: Endoscopy;  Laterality: N/A;  anaphalaxis allergy to eggs  . EYE SURGERY  2014    I have reviewed the social history and family history with the patient and they are unchanged from previous note.  ALLERGIES:  is allergic to eggs or egg-derived products.  MEDICATIONS:  Current Outpatient Medications  Medication Sig Dispense Refill  . ELIQUIS 5 MG TABS tablet TAKE 1 TABLET BY MOUTH 2 TIMES DAILY. 60 tablet 11  . fluticasone (FLONASE) 50 MCG/ACT nasal spray INSTILL 2 SPRAYS INTO BOTH NOSTRILS DAILY 16 g 6  . imatinib (GLEEVEC) 400 MG tablet TAKE 1 TABLET (  400 MG TOTAL) BY MOUTH DAILY. TAKE WITH MEALS AND LARGE GLASS OF WATER. CAUTION:CHEMOTHERAPY. 30 tablet 3  . lisinopril (ZESTRIL) 5 MG tablet TAKE 1 TABLET BY MOUTH DAILY. PLEASE KEEP UPCOMING APPT FOR FUTURE REFILLS. 30 tablet 11  . metoprolol (TOPROL XL) 200 MG 24 hr tablet Take 1/2 tab in the morning and 1 tab at night. 135 tablet 3  .  atorvastatin (LIPITOR) 10 MG tablet Take 1 tablet by mouth every other day. 45 tablet 3  . furosemide (LASIX) 80 MG tablet Take 1 tablet (80 mg total) by mouth daily. 90 tablet 3   No current facility-administered medications for this visit.     PHYSICAL EXAMINATION: ECOG PERFORMANCE STATUS: 0 - Asymptomatic  Vitals:   03/07/19 0812  BP: 119/72  Pulse: 83  Resp: 20  Temp: 98.2 F (36.8 C)  SpO2: 100%   Filed Weights   03/07/19 0812  Weight: 285 lb 6.4 oz (129.5 kg)    GENERAL:alert, no distress and comfortable SKIN: skin color, texture, turgor are normal, no rashes or significant lesions EYES: normal, Conjunctiva are pink and non-injected, sclera clear OROPHARYNX:no exudate, no erythema and lips, buccal mucosa, and tongue normal  NECK: supple, thyroid normal size, non-tender, without nodularity LYMPH:  no palpable lymphadenopathy in the cervical, axillary or inguinal LUNGS: clear to auscultation and percussion with normal breathing effort HEART: regular rate & rhythm and no murmurs and no lower extremity edema ABDOMEN:abdomen soft, non-tender and normal bowel sounds Musculoskeletal:no cyanosis of digits and no clubbing  NEURO: alert & oriented x 3 with fluent speech, no focal motor/sensory deficits  LABORATORY DATA:  I have reviewed the data as listed CBC Latest Ref Rng & Units 03/07/2019 12/06/2018 09/06/2018  WBC 4.0 - 10.5 K/uL 5.4 4.7 5.1  Hemoglobin 13.0 - 17.0 g/dL 14.4 14.8 14.4  Hematocrit 39.0 - 52.0 % 43.2 44.1 44.5  Platelets 150 - 400 K/uL 160 149(L) 147(L)     CMP Latest Ref Rng & Units 03/07/2019 12/06/2018 09/06/2018  Glucose 70 - 99 mg/dL 100(H) 99 83  BUN 8 - 23 mg/dL 22 21 25(H)  Creatinine 0.61 - 1.24 mg/dL 1.41(H) 1.49(H) 1.51(H)  Sodium 135 - 145 mmol/L 141 141 143  Potassium 3.5 - 5.1 mmol/L 4.8 4.8 4.5  Chloride 98 - 111 mmol/L 105 104 105  CO2 22 - 32 mmol/L '28 30 30  ' Calcium 8.9 - 10.3 mg/dL 8.7(L) 8.9 9.0  Total Protein 6.5 - 8.1 g/dL 6.4(L)  6.3(L) 6.5  Total Bilirubin 0.3 - 1.2 mg/dL 1.1 0.9 1.3(H)  Alkaline Phos 38 - 126 U/L 52 69 80  AST 15 - 41 U/L '23 25 26  ' ALT 0 - 44 U/L '26 30 31      ' RADIOGRAPHIC STUDIES: I have personally reviewed the radiological images as listed and agreed with the findings in the report. No results found.   ASSESSMENT & PLAN:  [name] Aguilar is a 63 y.o. male with   1. Chronic Myelogenous Leukocytosis, Chronic phase  -He was diagnosed in May 2019.  -He has started Gleevec at the end of May 2019, tolerating very well.  -He had achieved complete hematological response within the first month. -His bcr/able has been trending down but still detectable on last visit. Continue Gleevec. If complete molecular remission has not been reached in 1 year on treatment will do mutation testing to determine moving forward with second generation TKI. -He is clinically doing well, asymptomatic. Labs reviewed, CBC WNL with  PLT at  160K, CMP WNL except Cr 1.41. bcr/abl is still pending, last level dropped to 1.7953%. Physical exam was unremarkable.  -He was diagnosed 1 year ago, his bcr/abl level has reduced nicely but has yet to reach MMR. Based on today's bcr/abl result, if MMR not achieved, may move forward with second generation TKI with gene mutation test.  -Will monitor his response with regular BCR/ABL PCR every 3 month.  -Continue Gleevec 440m daily for now  -follow-up in 3 months  2. Atrial Fibrillation and ischemic congestive heart failure with EF 25-30% 02/2015 -On treatment with Eliquis, Lipitor, Lasix, lisinopril, metoprolol and Verapamil.  -Followed by Cardiologist Dr. GJanit Paganand PCP -Last ECHO from 03/14/18 shows his EF has improved to 55-60%   3. LE edema and wound  -Secondary to CHF and leukocytosis -Much improved with Lasix 868mdaily and CML treatment, continue  -Superficial right lower extremity wound has completely healed   PLAN:  -continue Gleevec at 4003maily. -will  call him next week with the BCR/ABL result, if still not in MMR, will do genetic test and change to second generation TKI  -Lab and f/u in 3 months     No problem-specific Assessment & Plan notes found for this encounter.   No orders of the defined types were placed in this encounter.  All questions were answered. The patient knows to call the clinic with any problems, questions or concerns. No barriers to learning was detected. I spent 15 minutes counseling the patient face to face. The total time spent in the appointment was 20 minutes and more than 50% was on counseling and review of test results     [name] Aguilar 03/07/2019   I, AmoJoslyn Devonm acting as scribe for [name] Aguilar.   I have reviewed the above documentation for accuracy and completeness, and I agree with the above.

## 2019-03-07 ENCOUNTER — Other Ambulatory Visit: Payer: Self-pay

## 2019-03-07 ENCOUNTER — Inpatient Hospital Stay: Payer: 59 | Attending: Hematology | Admitting: Hematology

## 2019-03-07 ENCOUNTER — Telehealth: Payer: Self-pay | Admitting: Hematology

## 2019-03-07 ENCOUNTER — Inpatient Hospital Stay: Payer: 59

## 2019-03-07 ENCOUNTER — Other Ambulatory Visit: Payer: Self-pay | Admitting: Internal Medicine

## 2019-03-07 VITALS — BP 119/72 | HR 83 | Temp 98.2°F | Resp 20 | Ht 76.0 in | Wt 285.4 lb

## 2019-03-07 DIAGNOSIS — I4891 Unspecified atrial fibrillation: Secondary | ICD-10-CM | POA: Insufficient documentation

## 2019-03-07 DIAGNOSIS — E785 Hyperlipidemia, unspecified: Secondary | ICD-10-CM

## 2019-03-07 DIAGNOSIS — Z7901 Long term (current) use of anticoagulants: Secondary | ICD-10-CM | POA: Insufficient documentation

## 2019-03-07 DIAGNOSIS — I509 Heart failure, unspecified: Secondary | ICD-10-CM | POA: Diagnosis not present

## 2019-03-07 DIAGNOSIS — C9210 Chronic myeloid leukemia, BCR/ABL-positive, not having achieved remission: Secondary | ICD-10-CM | POA: Diagnosis not present

## 2019-03-07 DIAGNOSIS — I428 Other cardiomyopathies: Secondary | ICD-10-CM

## 2019-03-07 DIAGNOSIS — E669 Obesity, unspecified: Secondary | ICD-10-CM

## 2019-03-07 LAB — CMP (CANCER CENTER ONLY)
ALT: 26 U/L (ref 0–44)
AST: 23 U/L (ref 15–41)
Albumin: 3.8 g/dL (ref 3.5–5.0)
Alkaline Phosphatase: 52 U/L (ref 38–126)
Anion gap: 8 (ref 5–15)
BUN: 22 mg/dL (ref 8–23)
CO2: 28 mmol/L (ref 22–32)
Calcium: 8.7 mg/dL — ABNORMAL LOW (ref 8.9–10.3)
Chloride: 105 mmol/L (ref 98–111)
Creatinine: 1.41 mg/dL — ABNORMAL HIGH (ref 0.61–1.24)
GFR, Est AFR Am: >60 mL/min (ref >60)
GFR, Estimated: 52 mL/min — ABNORMAL LOW (ref >60)
Glucose, Bld: 100 mg/dL — ABNORMAL HIGH (ref 70–99)
Potassium: 4.8 mmol/L (ref 3.5–5.1)
Sodium: 141 mmol/L (ref 135–145)
Total Bilirubin: 1.1 mg/dL (ref 0.3–1.2)
Total Protein: 6.4 g/dL — ABNORMAL LOW (ref 6.5–8.1)

## 2019-03-07 LAB — CBC WITH DIFFERENTIAL (CANCER CENTER ONLY)
Abs Immature Granulocytes: 0.01 10*3/uL (ref 0.00–0.07)
Basophils Absolute: 0 10*3/uL (ref 0.0–0.1)
Basophils Relative: 0 %
Eosinophils Absolute: 0.1 10*3/uL (ref 0.0–0.5)
Eosinophils Relative: 2 %
HCT: 43.2 % (ref 39.0–52.0)
Hemoglobin: 14.4 g/dL (ref 13.0–17.0)
Immature Granulocytes: 0 %
Lymphocytes Relative: 20 %
Lymphs Abs: 1.1 10*3/uL (ref 0.7–4.0)
MCH: 32.6 pg (ref 26.0–34.0)
MCHC: 33.3 g/dL (ref 30.0–36.0)
MCV: 97.7 fL (ref 80.0–100.0)
Monocytes Absolute: 0.5 10*3/uL (ref 0.1–1.0)
Monocytes Relative: 10 %
Neutro Abs: 3.6 10*3/uL (ref 1.7–7.7)
Neutrophils Relative %: 68 %
Platelet Count: 160 10*3/uL (ref 150–400)
RBC: 4.42 MIL/uL (ref 4.22–5.81)
RDW: 13.9 % (ref 11.5–15.5)
WBC Count: 5.4 10*3/uL (ref 4.0–10.5)
nRBC: 0 % (ref 0.0–0.2)

## 2019-03-07 MED ORDER — ATORVASTATIN CALCIUM 10 MG PO TABS: ORAL_TABLET | ORAL | 3 refills | Status: DC

## 2019-03-07 MED FILL — ATORVASTATIN 10 MG TABLET: 10 | 90 days supply | Qty: 45 | Fill #0

## 2019-03-07 NOTE — Telephone Encounter (Signed)
Scheduled appt per 5/20 los. A calendar will be mailed out. °

## 2019-03-13 ENCOUNTER — Other Ambulatory Visit: Payer: Self-pay | Admitting: Family Medicine

## 2019-03-13 MED FILL — FLUTICASONE PROP 50 MCG SPR: 50 | 30 days supply | Qty: 16 | Fill #0

## 2019-03-14 ENCOUNTER — Encounter: Payer: Self-pay | Admitting: Family Medicine

## 2019-03-14 ENCOUNTER — Ambulatory Visit (INDEPENDENT_AMBULATORY_CARE_PROVIDER_SITE_OTHER): Payer: 59 | Admitting: Family Medicine

## 2019-03-14 VITALS — BP 90/70 | HR 95 | Temp 98.3°F | Ht 76.0 in | Wt 287.0 lb

## 2019-03-14 DIAGNOSIS — E785 Hyperlipidemia, unspecified: Secondary | ICD-10-CM | POA: Diagnosis not present

## 2019-03-14 DIAGNOSIS — C9210 Chronic myeloid leukemia, BCR/ABL-positive, not having achieved remission: Secondary | ICD-10-CM | POA: Diagnosis not present

## 2019-03-14 DIAGNOSIS — Z125 Encounter for screening for malignant neoplasm of prostate: Secondary | ICD-10-CM | POA: Diagnosis not present

## 2019-03-14 DIAGNOSIS — N183 Chronic kidney disease, stage 3 unspecified: Secondary | ICD-10-CM | POA: Insufficient documentation

## 2019-03-14 DIAGNOSIS — Z0000 Encounter for general adult medical examination without abnormal findings: Secondary | ICD-10-CM | POA: Diagnosis not present

## 2019-03-14 DIAGNOSIS — Z23 Encounter for immunization: Secondary | ICD-10-CM | POA: Diagnosis not present

## 2019-03-14 DIAGNOSIS — R739 Hyperglycemia, unspecified: Secondary | ICD-10-CM

## 2019-03-14 DIAGNOSIS — E669 Obesity, unspecified: Secondary | ICD-10-CM | POA: Diagnosis not present

## 2019-03-14 LAB — LIPID PANEL
Cholesterol: 123 mg/dL (ref 0–200)
HDL: 39.6 mg/dL (ref >39.00)
LDL Cholesterol: 60 mg/dL (ref 0–99)
NonHDL: 83.08
Total CHOL/HDL Ratio: 3
Triglycerides: 115 mg/dL (ref 0.0–149.0)
VLDL: 23 mg/dL (ref 0.0–40.0)

## 2019-03-14 LAB — PSA: PSA: 0.26 ng/mL (ref 0.10–4.00)

## 2019-03-14 LAB — HEMOGLOBIN A1C: Hgb A1c MFr Bld: 6.1 % (ref 4.6–6.5)

## 2019-03-14 NOTE — Progress Notes (Signed)
Phone: [phone]   Subjective:  Patient presents today for their annual physical. Chief complaint-noted.   See problem oriented charting- ROS- full  review of systems was completed and negative except for: continued edema. Energy levels are far better than last year.   The following were reviewed and entered/updated in epic: Past Medical History:  Diagnosis Date  . Atrial fibrillation ([place])   . Diverticulosis   . Hx of adenomatous colonic polyps   . Hyperlipidemia   . Hypertension   . Vitreous anomalies 2014   Tack down of vitreous tear   Patient Active Problem List   Diagnosis Date Noted  . CML (chronic myelocytic leukemia) ([place]) 03/04/2018    Priority: High  . CKD (chronic kidney disease), stage III ([place]) 03/14/2019    Priority: Medium  . Hyperglycemia 12/08/2016    Priority: Medium  . NICM (nonischemic cardiomyopathy) ([place]) 04/02/2015    Priority: Medium  . Atrial fibrillation ([place]) 09/11/2014    Priority: Medium  . Hyperlipidemia 06/23/2007    Priority: Medium  . Hx of adenomatous colonic polyps     Priority: Low  . Obesity (BMI 30-39.9) 09/19/2013    Priority: Low  . H/O calcium pyrophosphate deposition disease (CPPD) 10/16/2009    Priority: Low   Past Surgical History:  Procedure Laterality Date  . CARDIAC CATHETERIZATION N/A 04/30/2015   Procedure: Left Heart Cath and Coronary Angiography;  Surgeon: Jerline Pain, MD;  Location: [facility] LAB;  Service: Cardiovascular;  Laterality: N/A;  . CARDIOVERSION N/A 11/20/2014   Procedure: CARDIOVERSION;  Surgeon: Pixie Casino, MD;  Location: [facility] ENDOSCOPY;  Service: Cardiovascular;  Laterality: N/A;  11:38 Etomidate given IV for unsuccessful synched cardioversion @ 150 joules from Afib, 11:39 repeated synched cardioversion @ 200 joules successfully from Afib to SR. 12 lead EKG ordered to confirm..  . COLONOSCOPY    . COLONOSCOPY N/A 12/24/2015   Procedure: COLONOSCOPY ( ANAPHALAXIS ALLERGY TO EGGS);  Surgeon:  Gatha Mayer, MD;  Location: [facility] ENDOSCOPY;  Service: Endoscopy;  Laterality: N/A;  anaphalaxis allergy to eggs  . EYE SURGERY  2014    Family History  Problem Relation Age of Onset  . Asthma Mother   . Alzheimer's disease Mother        diagnosed 65  . Brain cancer Father        not genetic  . Other Brother        gilbert disease  . Heart attack Maternal Grandmother   . Heart attack Maternal Uncle   . Colon cancer Neg Hx   . Esophageal cancer Neg Hx   . Pancreatic cancer Neg Hx   . Liver disease Neg Hx     Medications- reviewed and updated Current Outpatient Medications  Medication Sig Dispense Refill  . atorvastatin (LIPITOR) 10 MG tablet Take 1 tablet by mouth every other day. 45 tablet 3  . ELIQUIS 5 MG TABS tablet TAKE 1 TABLET BY MOUTH 2 TIMES DAILY. 60 tablet 11  . fluticasone (FLONASE) 50 MCG/ACT nasal spray INSTILL 2 SPRAYS INTO BOTH NOSTRILS DAILY 16 g 6  . imatinib (GLEEVEC) 400 MG tablet TAKE 1 TABLET (400 MG TOTAL) BY MOUTH DAILY. TAKE WITH MEALS AND LARGE GLASS OF WATER. CAUTION:CHEMOTHERAPY. 30 tablet 3  . lisinopril (ZESTRIL) 5 MG tablet TAKE 1 TABLET BY MOUTH DAILY. PLEASE KEEP UPCOMING APPT FOR FUTURE REFILLS. 30 tablet 11  . metoprolol (TOPROL XL) 200 MG 24 hr tablet Take 1/2 tab in the morning and 1 tab at  night. 135 tablet 3  . furosemide (LASIX) 80 MG tablet Take 1 tablet (80 mg total) by mouth daily. 90 tablet 3   No current facility-administered medications for this visit.     Allergies-reviewed and updated Allergies  Allergen Reactions  . Eggs Or Egg-Derived Products Anaphylaxis    REACTION: no vaccines    Social History   Social History Narrative   Married 1977. 2 children (son in doctorate program at [employer] nearing end and daughter with [employer] in [place]). No grandkids   [employer] [place]   [employer] for [employer].       Veterinarian in town. [facility] (been there for 5 years, [place] 27 years before that). 3 dogs, 1  cat, 1 snake.      Hobbies: fishing on reservoirs in [place], family time, nature, hiking   Objective  Objective:  BP 90/70 (BP Location: Left Arm, Patient Position: Sitting, Cuff Size: Large)   Pulse 95   Temp 98.3 F (36.8 C) (Oral)   Ht 6\' 4"  (1.93 m)   Wt 287 lb (130.2 kg)   SpO2 95%   BMI 34.93 kg/m  Gen: NAD, resting comfortably HEENT: Mucous membranes are moist. Oropharynx normal Neck: no obvious thyromegaly CV: RRR no murmurs rubs or gallops Lungs: CTAB no crackles, wheeze, rhonchi Abdomen: soft/nontender/nondistended/normal bowel sounds. No rebound or guarding.  Obesity noted Ext: no edema Skin: warm, dry Neuro: grossly normal, moves all extremities, PERRLA   Assessment and Plan  63 y.o. male presenting for annual physical.  Health Maintenance counseling: 1. Anticipatory guidance: Patient counseled regarding regular dental exams -q6 months generally but delayed due to covid, eye exams -yearly,  avoiding smoking and second hand smoke , limiting alcohol to 2 beverages per day -  Has not had a drink in a few years .   2. Risk factor reduction:  Advised patient of need for regular exercise and diet rich and fruits and vegetables to reduce risk of heart attack and stroke. Exercise- covid 19 has put a damper on exercise- he is going to try to do some walks in the neighborhood (enjoys hiking much more plus could do it for longer). Diet-stable- wife is dietician but he eats a full plate. Doesn't have much a sweet tooth- so really feels like exercise needs to be his focus. .  Wt Readings from Last 3 Encounters:  03/14/19 287 lb (130.2 kg)  03/07/19 285 lb 6.4 oz (129.5 kg)  12/06/18 283 lb (128.4 kg)  3. Immunizations/screenings/ancillary studies- Tdap today . Advised shingrix at future date.  Immunization History  Administered Date(s) Administered  . Td 02/16/2000, 10/03/2008  . Tdap 03/14/2019  4. Prostate cancer screening-  low risk PSA trend in past- will update PSA. Some BPH  on exam last year- update today also show some BPH Lab Results  Component Value Date   PSA 0.26 03/14/2019   PSA 0.13 03/01/2018   PSA 0.21 12/01/2016   5. Colon cancer screening - 2017 with 5 year repeat due to adenomas 6. Skin cancer screening- no dermatologist- uses protective clothing mainly. -advised regular sunscreen use. Denies worrisome, changing, or new skin lesions other than lesion on right neck-appears to be seborrheic keratosis 7. never smoker   Status of chronic or acute concerns   #CML- remains on Gleevac and working with Dr. Freddie Breech has some lab results pending which may change treatment plans  #Allergies - rhinorrhea still- flonase with reasonable relief.   # NICM- follows with Dr.  Lovena Le. LAST EF up to 55%!  On lasix 80mg   # atrial fibrillation- on eliquis for anticoagulation and metoprolol for rate control - now to yearly visits with Dr. Lovena Le  # hyperlipidemia- on atorvastatin 10mg  every other day-excellent control lipids today  # hyperglycemia- update a1c and cbg with labs-did not diagnosed with diabetes last year with one-time elevation in A1c over 6.5.Fasting cbg was 100 on most recent labs. Just like for obesity- Encouraged need for healthy eating, regular exercise, weight loss.  Lab Results  Component Value Date   HGBA1C 6.1 03/14/2019   CKD (chronic kidney disease), stage III (HCC) S: We reviewed multiple BMPs with GFR in the 50s-appears to have developed CKD stage III- long-term high-dose Lasix likely contributes A/P: Fortunately blood pressure and lipids are well controlled-continue risk factor modification.  He already knows to avoid NSAIDs due to being on anticoagulant  Future Appointments  Date Time Provider [place]  06/07/2019  7:45 AM [facility] 5 [facility] None  06/07/2019  8:15 AM Truitt Merle, MD [facility] None  08/01/2019  8:15 AM Hayden Pedro, MD [facility] None  03/19/2020  8:20 AM Marin Olp, MD [facility]   Return  in about 1 year (around 03/13/2020) for physical.  Lab/Order associations: Fasting Preventative health care - Plan: Hemoglobin A1c, Lipid panel, PSA, Tdap vaccine greater than or equal to 7yo IM  Hyperglycemia - Plan: Hemoglobin A1c  Hyperlipidemia, unspecified hyperlipidemia type - Plan: Lipid panel  Screening for prostate cancer - Plan: PSA  Need for prophylactic vaccination with combined diphtheria-tetanus-pertussis (DTP) vaccine - Plan: Tdap vaccine greater than or equal to 7yo IM  Obesity (BMI 30-39.9)  CKD (chronic kidney disease), stage III (HCC)  CML (chronic myelocytic leukemia) ([place])  Return precautions advised.  Garret Reddish, MD

## 2019-03-14 NOTE — Assessment & Plan Note (Signed)
S: We reviewed multiple BMPs with GFR in the 50s-appears to have developed CKD stage III- long-term high-dose Lasix likely contributes A/P: Fortunately blood pressure and lipids are well controlled-continue risk factor modification.  He already knows to avoid NSAIDs due to being on anticoagulant

## 2019-03-14 NOTE — Patient Instructions (Addendum)
Health Maintenance Due  Topic Date Due  . TETANUS/TDAP - today 10/03/2018   Please stop by lab before you go If you do not have mychart- we will call you about results within 5 business days of [place] receiving them.  If you have mychart- we will send your results within 3 business days of [place] receiving them.  If abnormal or we want to clarify a result, we will call or mychart you to make sure you receive the message.  If you have questions or concerns or don't hear within 5-7 days, please send [place] a message or call us.    Would love to see you exercising 150 minutes a week  CKD III - want to mention this  6 month follow up if you would like or can stick with yearly physical

## 2019-03-15 ENCOUNTER — Other Ambulatory Visit: Payer: Self-pay | Admitting: Hematology

## 2019-03-15 DIAGNOSIS — C9210 Chronic myeloid leukemia, BCR/ABL-positive, not having achieved remission: Secondary | ICD-10-CM

## 2019-03-16 ENCOUNTER — Other Ambulatory Visit: Payer: Self-pay

## 2019-03-16 ENCOUNTER — Inpatient Hospital Stay: Payer: 59

## 2019-03-16 DIAGNOSIS — Z7901 Long term (current) use of anticoagulants: Secondary | ICD-10-CM | POA: Diagnosis not present

## 2019-03-16 DIAGNOSIS — C9210 Chronic myeloid leukemia, BCR/ABL-positive, not having achieved remission: Secondary | ICD-10-CM

## 2019-03-16 DIAGNOSIS — I509 Heart failure, unspecified: Secondary | ICD-10-CM | POA: Diagnosis not present

## 2019-03-16 DIAGNOSIS — I4891 Unspecified atrial fibrillation: Secondary | ICD-10-CM | POA: Diagnosis not present

## 2019-03-19 ENCOUNTER — Other Ambulatory Visit: Payer: Self-pay | Admitting: Internal Medicine

## 2019-03-19 MED FILL — FUROSEMIDE 80 MG TAB: 80 | 90 days supply | Qty: 90 | Fill #0

## 2019-03-19 MED FILL — ELIQUIS 5 MG TABLET: 5 | 30 days supply | Qty: 60 | Fill #3

## 2019-03-21 LAB — BCR/ABL

## 2019-03-26 LAB — BCR-ABL1 KINASE DOMAIN MUTATION ANALYSIS

## 2019-03-27 ENCOUNTER — Telehealth: Payer: Self-pay | Admitting: Pharmacist

## 2019-03-27 ENCOUNTER — Other Ambulatory Visit: Payer: Self-pay | Admitting: Hematology

## 2019-03-27 DIAGNOSIS — C9210 Chronic myeloid leukemia, BCR/ABL-positive, not having achieved remission: Secondary | ICD-10-CM

## 2019-03-27 MED ORDER — DASATINIB 100 MG PO TABS: 100.0000 mg | ORAL_TABLET | Freq: Every day | ORAL | 2 refills | Status: DC

## 2019-03-27 NOTE — Telephone Encounter (Signed)
Oral Oncology Pharmacist Encounter  Received new prescription for Sprycel (dasatinib) for the treatment of chronic myeloid leukemia in chronic phase that has not achieved MMR after 12 months of therapy, planned duration until disease progression or unaccepatble.  Patient started on Gleevec 437m by mouth once daily in May 2019 BCR-ABL performed 03/07/19 shows he has not yet reached MMR (1.2729%) and therapy is being changed to Sprycel at 1016monce daily for improved disease control Patient was negative for any mutations shows resistance to therapy  Labs from 03/07/19 assessed, OK for treatment initiation. SCr=1.41, est CrCl ~ 90 mL/min (maybe overestimated due to wt=130.2kg) No renal dose adjustment per manufacturer  Noted patient with history of atrial fibrillation and non-ischemic cardiomyopathy 03/14/2018 ECHO shows LVEF 55-60%, LV mildly dilated, systolic function is normal 11/08/2018 EKG shows QTc 450 msec  Sprycel is known to cause cardiac dysfunction, cardiac ischemic events (4%), cardiac fluid-retention events (>10%), and conduction abnormalities (7%). Patient is under the care of a cardiologist and will be counseled about cardiac ADEs  Current medication list in Epic reviewed, moderate DDI with Sprycel and Eliquis identified:  Category C interaction: Sprycel may enhance anticoagulant effects of anticoagulants as it is associated with thrombocytopenia. Pltc WNL at 160k on 03/07/19, will continue to be monitored. No change to current therapy is indicated at this time.  [facility].  Oral Oncology Clinic will continue to follow for insurance authorization, copayment issues, initial counseling and start date.  JeJohny DrillingPharmD, BCPS, BCOP  03/27/2019 10:12 AM Oral Oncology Clinic [phone]

## 2019-03-27 NOTE — Telephone Encounter (Signed)
Oral Oncology Pharmacy Encounter   Received a Prior Authorization for Sprycel. Authorization has been submitted to patient's insurance via Cover My Meds. Will update once we receive a response.  Insurance: [facility]- [employer]  11:11 AM Beatriz Chancellor, CPhT

## 2019-03-28 ENCOUNTER — Telehealth: Payer: Self-pay | Admitting: Pharmacist

## 2019-03-28 ENCOUNTER — Encounter: Payer: Self-pay | Admitting: Family Medicine

## 2019-03-28 DIAGNOSIS — H25013 Cortical age-related cataract, bilateral: Secondary | ICD-10-CM | POA: Diagnosis not present

## 2019-03-28 DIAGNOSIS — H2513 Age-related nuclear cataract, bilateral: Secondary | ICD-10-CM | POA: Diagnosis not present

## 2019-03-28 DIAGNOSIS — H40023 Open angle with borderline findings, high risk, bilateral: Secondary | ICD-10-CM | POA: Diagnosis not present

## 2019-03-28 DIAGNOSIS — H3563 Retinal hemorrhage, bilateral: Secondary | ICD-10-CM | POA: Diagnosis not present

## 2019-03-28 NOTE — Telephone Encounter (Signed)
Oral Oncology Pharmacist Encounter  New insurance authorization for Sprycel 100mg  tablets submitted on Cover My Meds  Key: [id number] Status is pending  Received call from clinical team for clarification about mutational status  Authorization status is pending  This encounter will continue to be updated until final determination.  Johny Drilling, PharmD, BCPS, BCOP  03/28/2019 3:29 PM Oral Oncology Clinic [phone]

## 2019-03-28 NOTE — Telephone Encounter (Signed)
Oral Chemotherapy Pharmacist Encounter   Attempted to reach patient to provide update and offer for initial counseling on oral medication: Sprycel.  No answer.  Left voicemail for patient to call back to discuss details of medication acquisition and initial counseling session.  Johny Drilling, PharmD, BCPS, BCOP  03/28/2019   1:42 PM Oral Oncology Clinic [phone]

## 2019-03-28 NOTE — Telephone Encounter (Signed)
Oral Chemotherapy Pharmacist Encounter  I spoke with patient for overview of: Sprycel (dasatinib) for the treatment of chronic myeloid leukemia in chronic phase that has not achieved MMR after 12 months of therapy, planned duration until disease progression or unaccepatble.  Counseled patient on administration, dosing, side effects, monitoring, drug-food interactions, safe handling, storage, and disposal.  Patient will take Sprycel 100 mg tablets, 1 tablet by mouth once daily without regard to food.  Patient instructed that administering with food may decrease GI irritation.   Patient knows to avoid grapefruit or grapefruit juice while on therapy with Sprycel.  Sprycel start date: 04/04/2019 Last Gleevec dose: 04/03/2019  Adverse effects include but are not limited to: fluid retention, nausea, diarrhea, rash, fatigue, dyspnea, hemorrhage, and thrombocytopenia.  Patient will alert the office if nausea develops so that a prescription for an antinausea medication can be sent to local pharmacy. He denies wanting this done at this time.   Patient will obtain anti diarrheal and alert the office of 4 or more loose stools above baseline.  Reviewed with patient importance of keeping a medication schedule and plan for any missed doses.  Medication reconciliation performed and medication/allergy list updated.  Insurance authorization for Sprycel is still in process. I anticipate insurance approval by tomorrow, 03/29/2019. He will be eligible for manufacturer copayment coupon if his Sprycel is not fully covered by insurance.  We will plan ship the 1st fill of Sprycel from the [facility] on 04/02/2019 to deliver to patient's home on 04/03/2019.  Patient knows the pharmacy will reach out 5-7 days prior to needing next fill of Sprycel to coordinate continued medication acquisition to prevent break in therapy, this is the same process as for his previous [facility] fills.  Patient with  questions about plan for follow-up appointments since he remembers being seen in the office within his 1st month starting on [place]. Message sent to Dr. Burr Medico and collaborative practice RN for follow-up appointments and request for future appointments to be scheduled on Wednesdays.  All questions answered.  Mr. Groeneveld voiced understanding and appreciation.   Patient knows to call the office with questions or concerns.  Johny Drilling, PharmD, BCPS, BCOP  03/28/2019  2:48 PM Oral Oncology Clinic [phone]

## 2019-03-29 ENCOUNTER — Telehealth: Payer: Self-pay | Admitting: Hematology

## 2019-03-29 ENCOUNTER — Telehealth: Payer: Self-pay | Admitting: Pharmacy Technician

## 2019-03-29 NOTE — Telephone Encounter (Signed)
Oral Oncology Patient Advocate Encounter:   Signed patient up for a Sprycel copay card. With card patient's copay is $0. Program has a maximum benefit of $15,000 per calendar year. Patient's copay without card is $250.  BIN: [id number] PCN- [id number] ID- [id number] Group- [id number]  Information has been provided to pharmacy.  11:50 AM Beatriz Chancellor, CPhT

## 2019-03-29 NOTE — Telephone Encounter (Signed)
R/s appt per 6/10 sch message - unable to reach pt . Left message with appt date and time

## 2019-03-29 NOTE — Telephone Encounter (Signed)
Oral Oncology Patient Advocate Encounter:  Received a fax from [facility] regarding a prior authorization for [facility] . Authorization has been APPROVED from 03/29/2019 to 03/27/2020. Approval is for 1 tablet per day.  Will send document to scan center.  Authorization # [id number] Phone # [phone]  12:03 PM Beatriz Chancellor, CPhT

## 2019-04-02 MED FILL — SPRYCEL 100 MG TABLET: 100 | 30 days supply | Qty: 30 | Fill #0

## 2019-04-03 NOTE — Telephone Encounter (Signed)
Oral Oncology Patient Advocate Encounter:  Confirmed with [employer] that patient's Sprycel mailed out on 04/02/19 to deliver to patient on 04/03/19.  9:17 AM Beatriz Chancellor, CPhT

## 2019-04-04 ENCOUNTER — Other Ambulatory Visit: Payer: Self-pay | Admitting: Internal Medicine

## 2019-04-04 MED FILL — METOPROLOL SUCCINATE ER 200: 200 | 90 days supply | Qty: 135 | Fill #0

## 2019-04-18 MED FILL — FLUTICASONE PROP 50 MCG SPR: 50 | 30 days supply | Qty: 16 | Fill #1

## 2019-04-18 MED FILL — ELIQUIS 5 MG TABLET: 5 | 30 days supply | Qty: 60 | Fill #4

## 2019-04-26 MED FILL — SPRYCEL 100 MG TABLET: 100 | 30 days supply | Qty: 30 | Fill #1

## 2019-05-02 NOTE — Progress Notes (Signed)
[place]   Telephone:(336) [phone] Fax:(336) [phone]   Clinic Follow up Note   Patient Care Team: Marin Olp, MD as PCP - General (Family Medicine) Evans Lance, MD as Consulting Physician (Cardiology)  Date of Service:  05/09/2019  CHIEF COMPLAINT: F/u for CML, Chronic phase  RESPONSE EVALUATION:  Complete hematological response: <1 month MMR: not achieve yet  Bcr/abl IS 03/10/18: 41.28% 06/07/18: 16.96% 09/06/18: 3.78% 12/06/18: 1.7953%  03/07/19: 1.27%  CURRENT THERAPY: StartedGleevec 431m daily on 03/15/18, switched to desatinib on 03/29/2019   INTERVAL HISTORY:  [name] Nosteris here for a follow up CML. He presents to the clinic alone. He started Desatinib about 6 weeks ago, he initially had mild GI upset, and mild diarrhea, which gradually resolved on its own.  He is otherwise tolerating very well, denies any dyspnea, chest discomfort, leg swelling, or any other new symptoms.  He still works full-time, is good appetite    REVIEW OF SYSTEMS:   Constitutional: Denies fevers, chills or abnormal weight loss Eyes: Denies blurriness of vision Ears, nose, mouth, throat, and face: Denies mucositis or sore throat Respiratory: Denies cough, dyspnea or wheezes Cardiovascular: Denies palpitation, chest discomfort or lower extremity swelling Gastrointestinal:  Denies nausea, heartburn or change in bowel habits Skin: Denies abnormal skin rashes Lymphatics: Denies new lymphadenopathy or easy bruising Neurological:Denies numbness, tingling or new weaknesses Behavioral/Psych: Mood is stable, no new changes  All other systems were reviewed with the patient and are negative.  MEDICAL HISTORY:  Past Medical History:  Diagnosis Date  . [place] [place]   . Diverticulosis   . Hx of adenomatous colonic polyps   . Hyperlipidemia   . Hypertension   . Vitreous anomalies 2014   Tack down of vitreous tear    SURGICAL HISTORY: Past  Surgical History:  Procedure Laterality Date  . CARDIAC CATHETERIZATION N/A 04/30/2015   Procedure: Left Heart Cath and Coronary Angiography;  Surgeon: MJerline Pain MD;  [facility];  Service: Cardiovascular;  Laterality: N/A;  . CARDIOVERSION N/A 11/20/2014   Procedure: CARDIOVERSION;  Surgeon: KPixie Casino MD;  [facility];  Service: Cardiovascular;  Laterality: N/A;  11:38 Etomidate given IV for unsuccessful synched cardioversion @ 150 joules from Afib, 11:39 repeated synched cardioversion @ 200 joules successfully from Afib to SR. 12 lead EKG ordered to confirm..  . COLONOSCOPY    . COLONOSCOPY N/A 12/24/2015   Procedure: COLONOSCOPY ( ANAPHALAXIS ALLERGY TO EGGS);  Surgeon: CGatha Mayer MD;  [facility];  Service: Endoscopy;  Laterality: N/A;  anaphalaxis allergy to eggs  . EYE SURGERY  2014    I have reviewed the social history and family history with the patient and they are unchanged from previous note.  ALLERGIES:  is allergic to eggs or egg-derived products.  MEDICATIONS:  Current Outpatient Medications  Medication Sig Dispense Refill  . atorvastatin (LIPITOR) 10 MG tablet Take 1 tablet by mouth every other day. 45 tablet 3  . dasatinib (SPRYCEL) 100 MG tablet Take 1 tablet (100 mg total) by mouth daily. May take with food to decrease stomach irritation. 30 tablet 2  . ELIQUIS 5 MG TABS tablet TAKE 1 TABLET BY MOUTH 2 TIMES DAILY. 60 tablet 11  . fluticasone (FLONASE) 50 MCG/ACT nasal spray INSTILL 2 SPRAYS INTO BOTH NOSTRILS DAILY 16 g 6  . furosemide (LASIX) 80 MG tablet TAKE 1 TABLET BY MOUTH DAILY. 90 tablet 2  . lisinopril (ZESTRIL) 5 MG  tablet TAKE 1 TABLET BY MOUTH DAILY. PLEASE KEEP UPCOMING APPT FOR FUTURE REFILLS. 30 tablet 11  . metoprolol (TOPROL-XL) 200 MG 24 hr tablet TAKE 1/2 TAB IN THE MORNING AND 1 TAB AT NIGHT. 135 tablet 2   No current facility-administered medications for this visit.     PHYSICAL EXAMINATION:  ECOG PERFORMANCE STATUS: 0 - Asymptomatic  Vitals:   05/09/19 0810  BP: 124/73  Pulse: 100  Resp: 20  Temp: 97.8 F (36.6 C)  SpO2: 97%   Filed Weights   05/09/19 0810  Weight: 288 lb 1.6 oz (130.7 kg)   GENERAL:alert, no distress and comfortable SKIN: skin color, texture, turgor are normal, no rashes or significant lesions EYES: normal, Conjunctiva are pink and non-injected, sclera clear NECK: supple, thyroid normal size, non-tender, without nodularity LYMPH:  no palpable lymphadenopathy in the cervical, axillary  LUNGS: clear to auscultation and percussion with normal breathing effort HEART: regular rate & rhythm and no murmurs and no lower extremity edema ABDOMEN:abdomen soft, non-tender and normal bowel sounds Musculoskeletal:no cyanosis of digits and no clubbing  NEURO: alert & oriented x 3 with fluent speech, no focal motor/sensory deficits  LABORATORY DATA:  I have reviewed the data as listed CBC Latest Ref Rng & Units 05/09/2019 03/07/2019 12/06/2018  WBC 4.0 - 10.5 K/uL 4.4 5.4 4.7  Hemoglobin 13.0 - 17.0 g/dL 12.8(L) 14.4 14.8  Hematocrit 39.0 - 52.0 % 38.1(L) 43.2 44.1  Platelets 150 - 400 K/uL 141(L) 160 149(L)     CMP Latest Ref Rng & Units 03/07/2019 12/06/2018 09/06/2018  Glucose 70 - 99 mg/dL 100(H) 99 83  BUN 8 - 23 mg/dL 22 21 25(H)  Creatinine 0.61 - 1.24 mg/dL 1.41(H) 1.49(H) 1.51(H)  Sodium 135 - 145 mmol/L 141 141 143  Potassium 3.5 - 5.1 mmol/L 4.8 4.8 4.5  Chloride 98 - 111 mmol/L 105 104 105  CO2 22 - 32 mmol/L _0 Calcium 8.9 - 10.3 mg/dL 8.7(L) 8.9 9.0  Total Protein 6.5 - 8.1 g/dL 6.4(L) 6.3(L) 6.5  Total Bilirubin 0.3 - 1.2 mg/dL 1.1 0.9 1.3(H)  Alkaline Phos 38 - 126 U/L 52 69 80  AST 15 - 41 U/L _1 ALT 0 - 44 U/L _2 RADIOGRAPHIC STUDIES: I have personally reviewed the radiological images as listed and agreed with the findings in the report. No results found.   ASSESSMENT & PLAN:  [name] Aguilar is a 63  y.o. male with   1. Chronic Myelogenous Leukocytosis, Chronic phase -He was diagnosed in May 2019.  -He was started [place] at the end of May 2019, tolerating very well. -He hadachieved complete hematological response within the first month. -His bcr/able has been trending down but complete molecular remissionwas not reached in 1 year, mutation test was negative, so he was switched to Dasatinib in June 2020 -He is tolerating dasatinib very well, mild GI symptoms have resolved not, no other AEs so far  -, Hemoglobin 12.8, platelet 141, mild cytopenia likely related to dasatinib, WBC normal, CMP and BCR/ABL results are still pending -Will monitor his response with regular BCR/ABL PCR every 3 month.  -follow-up in 3 months  2. Atrial Fibrillation and ischemic congestive heart failure with EF 25-30% 02/2015 -On treatment with Eliquis, Lipitor, Lasix, lisinopril, metoprolol and Verapamil.  -Followed by Cardiologist Dr. Janit Pagan and PCP -LastECHO from 03/14/18 shows his EF has improved to 55-60%   3. LE edema and wound  -  Secondary to CHF and leukocytosis -Much improved with Lasix 75m daily and CML treatment, continue  -Superficial right lower extremity wound has completely healed  PLAN:  -Lab reviewed, will continue Sprycel -Lab and follow-up in 3 months -Will call him with today's BCR/ABL result when It returns    No problem-specific Assessment & Plan notes found for this encounter.   No orders of the defined types were placed in this encounter.  All questions were answered. The patient knows to call the clinic with any problems, questions or concerns. No barriers to learning was detected. I spent 15 minutes counseling the patient face to face. The total time spent in the appointment was 20 minutes and more than 50% was on counseling and review of test results     YTruitt Merle MD 05/09/2019   I, AJoslyn Devon am acting as scribe for YTruitt Merle MD.   I have reviewed the  above documentation for accuracy and completeness, and I agree with the above.

## 2019-05-08 ENCOUNTER — Other Ambulatory Visit: Payer: Self-pay | Admitting: Hematology

## 2019-05-08 DIAGNOSIS — C9210 Chronic myeloid leukemia, BCR/ABL-positive, not having achieved remission: Secondary | ICD-10-CM | POA: Diagnosis not present

## 2019-05-09 ENCOUNTER — Encounter: Payer: Self-pay | Admitting: Hematology

## 2019-05-09 ENCOUNTER — Telehealth: Payer: Self-pay | Admitting: Hematology

## 2019-05-09 ENCOUNTER — Inpatient Hospital Stay (HOSPITAL_BASED_OUTPATIENT_CLINIC_OR_DEPARTMENT_OTHER): Payer: 59 | Admitting: Hematology

## 2019-05-09 ENCOUNTER — Inpatient Hospital Stay: Payer: 59 | Attending: Hematology

## 2019-05-09 ENCOUNTER — Other Ambulatory Visit: Payer: Self-pay

## 2019-05-09 VITALS — BP 124/73 | HR 100 | Temp 97.8°F | Resp 20 | Ht 76.0 in | Wt 288.1 lb

## 2019-05-09 DIAGNOSIS — C9210 Chronic myeloid leukemia, BCR/ABL-positive, not having achieved remission: Secondary | ICD-10-CM | POA: Insufficient documentation

## 2019-05-09 DIAGNOSIS — Z7901 Long term (current) use of anticoagulants: Secondary | ICD-10-CM

## 2019-05-09 DIAGNOSIS — I4891 Unspecified atrial fibrillation: Secondary | ICD-10-CM | POA: Diagnosis not present

## 2019-05-09 DIAGNOSIS — I509 Heart failure, unspecified: Secondary | ICD-10-CM | POA: Diagnosis not present

## 2019-05-09 LAB — CMP (CANCER CENTER ONLY)
ALT: 30 U/L (ref 0–44)
AST: 26 U/L (ref 15–41)
Albumin: 3.8 g/dL (ref 3.5–5.0)
Alkaline Phosphatase: 53 U/L (ref 38–126)
Anion gap: 9 (ref 5–15)
BUN: 24 mg/dL — ABNORMAL HIGH (ref 8–23)
CO2: 27 mmol/L (ref 22–32)
Calcium: 9.3 mg/dL (ref 8.9–10.3)
Chloride: 107 mmol/L (ref 98–111)
Creatinine: 1.33 mg/dL — ABNORMAL HIGH (ref 0.61–1.24)
GFR, Est AFR Am: >60 mL/min (ref >60)
GFR, Estimated: 56 mL/min — ABNORMAL LOW (ref >60)
Glucose, Bld: 112 mg/dL — ABNORMAL HIGH (ref 70–99)
Potassium: 4.3 mmol/L (ref 3.5–5.1)
Sodium: 143 mmol/L (ref 135–145)
Total Bilirubin: 0.8 mg/dL (ref 0.3–1.2)
Total Protein: 6.6 g/dL (ref 6.5–8.1)

## 2019-05-09 LAB — CBC WITH DIFFERENTIAL (CANCER CENTER ONLY)
Abs Immature Granulocytes: 0.01 10*3/uL (ref 0.00–0.07)
Basophils Absolute: 0 10*3/uL (ref 0.0–0.1)
Basophils Relative: 1 %
Eosinophils Absolute: 0 10*3/uL (ref 0.0–0.5)
Eosinophils Relative: 0 %
HCT: 38.1 % — ABNORMAL LOW (ref 39.0–52.0)
Hemoglobin: 12.8 g/dL — ABNORMAL LOW (ref 13.0–17.0)
Immature Granulocytes: 0 %
Lymphocytes Relative: 23 %
Lymphs Abs: 1 10*3/uL (ref 0.7–4.0)
MCH: 33.2 pg (ref 26.0–34.0)
MCHC: 33.6 g/dL (ref 30.0–36.0)
MCV: 98.7 fL (ref 80.0–100.0)
Monocytes Absolute: 0.5 10*3/uL (ref 0.1–1.0)
Monocytes Relative: 12 %
Neutro Abs: 2.8 10*3/uL (ref 1.7–7.7)
Neutrophils Relative %: 64 %
Platelet Count: 141 10*3/uL — ABNORMAL LOW (ref 150–400)
RBC: 3.86 MIL/uL — ABNORMAL LOW (ref 4.22–5.81)
RDW: 13.6 % (ref 11.5–15.5)
WBC Count: 4.4 10*3/uL (ref 4.0–10.5)
nRBC: 0 % (ref 0.0–0.2)

## 2019-05-09 NOTE — Telephone Encounter (Signed)
Scheduled appt per 7/22 los.  Printed calendar per patient request.

## 2019-05-16 ENCOUNTER — Telehealth: Payer: Self-pay

## 2019-05-16 MED FILL — LISINOPRIL 5 MG TABLET: 5 | 90 days supply | Qty: 90 | Fill #1

## 2019-05-16 NOTE — Telephone Encounter (Signed)
Spoke with patient to let him know BCR/ABL results  Per Dr. Burr Medico showed CML is in remission now.  He is responding better to Sprycel.  Patient verbalized an understanding and appreciated the call.

## 2019-05-23 MED FILL — FLUTICASONE PROP 50 MCG SPR: 50 | 30 days supply | Qty: 16 | Fill #2

## 2019-05-23 MED FILL — ELIQUIS 5 MG TABLET: 5 | 30 days supply | Qty: 60 | Fill #5

## 2019-05-29 MED FILL — SPRYCEL 100 MG TABLET: 100 | 30 days supply | Qty: 30 | Fill #2

## 2019-06-06 MED FILL — ATORVASTATIN 10 MG TABLET: 10 | 90 days supply | Qty: 45 | Fill #1

## 2019-06-07 ENCOUNTER — Ambulatory Visit: Payer: 59 | Admitting: Hematology

## 2019-06-07 ENCOUNTER — Other Ambulatory Visit: Payer: 59

## 2019-06-20 ENCOUNTER — Other Ambulatory Visit: Payer: Self-pay | Admitting: Hematology

## 2019-06-20 DIAGNOSIS — C9210 Chronic myeloid leukemia, BCR/ABL-positive, not having achieved remission: Secondary | ICD-10-CM

## 2019-06-20 MED FILL — FUROSEMIDE 80 MG TAB: 80 | 90 days supply | Qty: 90 | Fill #1

## 2019-06-20 MED FILL — ELIQUIS 5 MG TABLET: 5 | 30 days supply | Qty: 60 | Fill #6

## 2019-06-26 MED FILL — SPRYCEL 100 MG TABLET: 100 | 30 days supply | Qty: 30 | Fill #0

## 2019-07-02 ENCOUNTER — Telehealth: Payer: Self-pay | Admitting: Family Medicine

## 2019-07-02 ENCOUNTER — Telehealth: Payer: Self-pay

## 2019-07-02 ENCOUNTER — Other Ambulatory Visit: Payer: Self-pay

## 2019-07-02 DIAGNOSIS — Z20822 Contact with and (suspected) exposure to covid-19: Secondary | ICD-10-CM

## 2019-07-02 DIAGNOSIS — R6889 Other general symptoms and signs: Secondary | ICD-10-CM | POA: Diagnosis not present

## 2019-07-02 NOTE — Telephone Encounter (Signed)
Patient going to get tested

## 2019-07-02 NOTE — Telephone Encounter (Signed)
Patient calls with c/o onset of fever over the weekend 101-102, left axillary lymph node swelling, today temp 100.2, per Dr. Burr Medico instructed him to go for COVID testing at [facility] location.  Informed him we will call him with test results.  If negative will have him come in to be see here.   Patient verbalized an understanding.

## 2019-07-02 NOTE — Telephone Encounter (Signed)
Please schedule for virtual visit

## 2019-07-02 NOTE — Telephone Encounter (Signed)
Patient is calling for Advice, patient has fever of 100.5 and has a swollen lympnode in his left arm pit.    Patient is under chronic treatment for Afib.  Patient would like to know should have a virtual appt or go get COVID tested?  Per Karens, recommendation sending a message to Dr. Yong Channel clinical team [phone]

## 2019-07-04 ENCOUNTER — Telehealth: Payer: Self-pay

## 2019-07-04 LAB — NOVEL CORONAVIRUS, NAA: SARS-CoV-2, NAA: NOT DETECTED

## 2019-07-04 NOTE — Telephone Encounter (Signed)
Returned patient's call regarding recent fever and lymph node swelling in his left axilla. Patient stated he was able to be tested for COVID on Monday 07/02/19 and his results were negative. He also mentioned that his fever broke yesterday 07/03/19 and he's been afebrile all day today, and that the swelling in his left axilla has resolved. He did mention that a rash has developed to his upper thighs, feet, and lower back. It is red/itchy but he denied any pus or discharge from the welts. He stated he was resuming work tomorrow 07/05/19 and therefore would not be able to come in for an appointment until next Wednesday 07/11/19. Updated Dr. Burr Medico with information and was instructed to tell patient that she believes his symptoms are viral related (not related to his CLL or Sprycel medication) and that they should clear up on their own. However, if they worsen or become intolerable she recommends that he follow up with his PCP or go to urgent care. Otherwise she will plan to still see him on 08/08/19 for his lab and follow up appointment. Patient verbalized understanding and denied any further needs/concerns.

## 2019-07-11 MED FILL — FLUTICASONE PROP 50 MCG SPR: 50 | 30 days supply | Qty: 16 | Fill #3

## 2019-07-11 MED FILL — METOPROLOL SUCCINATE ER 200: 200 | 90 days supply | Qty: 135 | Fill #1

## 2019-07-23 MED FILL — ELIQUIS 5 MG TABLET: 5 | 30 days supply | Qty: 60 | Fill #7

## 2019-07-25 LAB — BCR/ABL

## 2019-07-31 MED FILL — SPRYCEL 100 MG TABLET: 100 | 30 days supply | Qty: 30 | Fill #1

## 2019-08-01 ENCOUNTER — Encounter (INDEPENDENT_AMBULATORY_CARE_PROVIDER_SITE_OTHER): Payer: 59 | Admitting: Ophthalmology

## 2019-08-01 ENCOUNTER — Other Ambulatory Visit: Payer: Self-pay

## 2019-08-01 DIAGNOSIS — H353111 Nonexudative age-related macular degeneration, right eye, early dry stage: Secondary | ICD-10-CM

## 2019-08-01 DIAGNOSIS — I10 Essential (primary) hypertension: Secondary | ICD-10-CM | POA: Diagnosis not present

## 2019-08-01 DIAGNOSIS — H43813 Vitreous degeneration, bilateral: Secondary | ICD-10-CM | POA: Diagnosis not present

## 2019-08-01 DIAGNOSIS — H3523 Other non-diabetic proliferative retinopathy, bilateral: Secondary | ICD-10-CM

## 2019-08-01 DIAGNOSIS — H35033 Hypertensive retinopathy, bilateral: Secondary | ICD-10-CM

## 2019-08-02 NOTE — Progress Notes (Signed)
[place]   Telephone:(336) [phone] Fax:(336) [phone]   Clinic Follow up Note   Patient Care Team: [name] Olp, MD as PCP - General (Family Medicine) [name] Lance, MD as Consulting Physician (Cardiology) [name] Pedro, MD as Consulting Physician (Ophthalmology)  Date of Service:  08/08/2019  CHIEF COMPLAINT:  F/u for CML, Chronic phase  RESPONSE EVALUATION:  Complete hematological response: <1 month MMR: yes, 2 months after starting Desatinib   Bcr/abl IS 03/10/18: 41.28% 06/07/18: 16.96% 09/06/18: 3.78% 12/06/18: 1.7953%  03/07/19: 1.27% 05/09/2019: 0.0999%  CURRENT THERAPY:  StartedGleevec 483m daily on 03/15/18, switched to desatinib on 03/29/2019   INTERVAL HISTORY:  [name] here for a follow up of CML.  He presents to the clinic alone.  He is tolerating dasatinib very well, with mild review stool once a day, no abdominal Aguilar, nausea, or other noticeable side effects.  He has gained some weight lately, but does not notice worsening leg swelling or shortness of breath.  He developed fever, skin rash in his abdomen and thighs, and left axillary adenopathy.  No cough, sputum production, or sore throat.  Fever resolved a few days, and the rash resolved in a week.  He did not call my office, Covid testing was negative, and he did not come in or see his primary care physician since he felt better in a few days.  This has completely resolved.  He also saw his retinal specialist Dr. MZigmund Danielrecently, he has had retinopathy for about 2 years, which [name] Danielfeels it's related to his CML, this has much improved on recent exam, corresponding to his good response to dasatinib.  His patient overall has been stable and no issues with glasses.  Review of system otherwise negative.    MEDICAL HISTORY:  Past Medical History:  Diagnosis Date  . Atrial [place] ([place]   . Diverticulosis   . Hx of adenomatous colonic polyps   . Hyperlipidemia   .  Hypertension   . Vitreous anomalies 2014   Tack down of vitreous tear    SURGICAL HISTORY: Past Surgical History:  Procedure Laterality Date  . CARDIAC CATHETERIZATION N/A 04/30/2015   Procedure: Left Heart Cath and Coronary Angiography;  Surgeon: MJerline Pain MD;  [facility];  Service: Cardiovascular;  Laterality: N/A;  . CARDIOVERSION N/A 11/20/2014   Procedure: CARDIOVERSION;  Surgeon: KPixie Casino MD;  [facility];  Service: Cardiovascular;  Laterality: N/A;  11:38 Etomidate given IV for unsuccessful synched cardioversion @ 150 joules from Afib, 11:39 repeated synched cardioversion @ 200 joules successfully from Afib to SR. 12 lead EKG ordered to confirm..  . COLONOSCOPY    . COLONOSCOPY N/A 12/24/2015   Procedure: COLONOSCOPY ( ANAPHALAXIS ALLERGY TO EGGS);  Surgeon: CGatha Mayer MD;  [facility];  Service: Endoscopy;  Laterality: N/A;  anaphalaxis allergy to eggs  . EYE SURGERY  2014    I have reviewed the social history and family history with the patient and they are unchanged from previous note.  ALLERGIES:  is allergic to eggs or egg-derived products.  MEDICATIONS:  Current Outpatient Medications  Medication Sig Dispense Refill  . atorvastatin (LIPITOR) 10 MG tablet Take 1 tablet by mouth every other day. 45 tablet 3  . ELIQUIS 5 MG TABS tablet TAKE 1 TABLET BY MOUTH 2 TIMES DAILY. 60 tablet 11  . fluticasone (FLONASE) 50 MCG/ACT nasal spray INSTILL 2 SPRAYS INTO BOTH NOSTRILS DAILY 16 g 6  .  furosemide (LASIX) 80 MG tablet TAKE 1 TABLET BY MOUTH DAILY. 90 tablet 2  . lisinopril (ZESTRIL) 5 MG tablet TAKE 1 TABLET BY MOUTH DAILY. PLEASE KEEP UPCOMING APPT FOR FUTURE REFILLS. 30 tablet 11  . metoprolol (TOPROL-XL) 200 MG 24 hr tablet TAKE 1/2 TAB IN THE MORNING AND 1 TAB AT NIGHT. 135 tablet 2  . SPRYCEL 100 MG tablet TAKE 1 TABLET (100 MG TOTAL) BY MOUTH DAILY. MAY TAKE WITH FOOD TO DECREASE STOMACH IRRITATION. 30 tablet 2   No  current facility-administered medications for this visit.     PHYSICAL EXAMINATION: ECOG PERFORMANCE STATUS: 0 - Asymptomatic  Vitals:   08/08/19 0818  BP: 102/76  Pulse: 98  Resp: 18  Temp: 98.6 F (37 C)  SpO2: 99%   Filed Weights   08/08/19 0818  Weight: 297 lb 1.6 oz (134.8 kg)   GENERAL:alert, no distress and comfortable SKIN: skin color, texture, turgor are normal, no rashes or significant lesions EYES: normal, Conjunctiva are pink and non-injected, sclera clear NECK: supple, thyroid normal size, non-tender, without nodularity LYMPH:  no palpable lymphadenopathy in the cervical, axillary  LUNGS: clear to auscultation and percussion with normal breathing effort HEART: regular rate & rhythm and no murmurs and no lower extremity edema ABDOMEN:abdomen soft, non-tender and normal bowel sounds Musculoskeletal:no cyanosis of digits and no clubbing  NEURO: alert & oriented x 3 with fluent speech, no focal motor/sensory deficits  LABORATORY DATA:  I have reviewed the data as listed CBC Latest Ref Rng & Units 08/08/2019 05/09/2019 03/07/2019  WBC 4.0 - 10.5 K/uL 4.5 4.4 5.4  Hemoglobin 13.0 - 17.0 g/dL 12.8(L) 12.8(L) 14.4  Hematocrit 39.0 - 52.0 % 38.0(L) 38.1(L) 43.2  Platelets 150 - 400 K/uL 162 141(L) 160     CMP Latest Ref Rng & Units 08/08/2019 05/09/2019 03/07/2019  Glucose 70 - 99 mg/dL 103(H) 112(H) 100(H)  BUN 8 - 23 mg/dL 24(H) 24(H) 22  Creatinine 0.61 - 1.24 mg/dL 1.26(H) 1.33(H) 1.41(H)  Sodium 135 - 145 mmol/L 141 143 141  Potassium 3.5 - 5.1 mmol/L 4.5 4.3 4.8  Chloride 98 - 111 mmol/L 106 107 105  CO2 22 - 32 mmol/L _0 Calcium 8.9 - 10.3 mg/dL 9.2 9.3 8.7(L)  Total Protein 6.5 - 8.1 g/dL 6.8 6.6 6.4(L)  Total Bilirubin 0.3 - 1.2 mg/dL 0.8 0.8 1.1  Alkaline Phos 38 - 126 U/L 52 53 52  AST 15 - 41 U/L _1 ALT 0 - 44 U/L _2 RADIOGRAPHIC STUDIES: I have personally reviewed the radiological images as listed and agreed with the  findings in the report. No results found.   ASSESSMENT & PLAN:  [name] Aguilar is a 63 y.o. male with   1. Chronic Myelogenous Leukocytosis, Chronic phase, in MMR -He was diagnosed in May 2019.  -He was started [place] at the end of May 2019, tolerating very well. -He hadachieved complete hematological response within the first month, but did not have complete molecular remissionin 1 year, mutation test was negative, so he was switched to Dasatinib in June 2020. He has had rapid response and archived MMR in July 2020. -He is tolerating dasatinib very well, with mild loose stool, no other noticeable side effects.  Physical exam was unremarkable today. -Lab reviewed, hemoglobin 12.8, rest of CBC normal.  CMP showed a mild elevated creatinine overall stable, otherwise within normal limits. -We will continue dasatinib.  We discussed the possibility  of discontinuing treatment if he remains to be in complete response for 2 years. -Follow-up in 3 months   2. Atrial Fibrillation and ischemic congestive heart failure with EF 25-30% 02/2015 -On treatment with Eliquis, Lipitor, Lasix, lisinopril, metoprolol and Verapamil.  -Followed by Cardiologist Dr. Janit Pagan and PCP -LastECHO from 03/14/18 shows his EF has improved to 55-60%  -Doing well medically, asymptomatic.  3. LE edema and wound  -Secondary to CHF and leukocytosis -Much improved withLasix 52m dailyand CML treatment, continue -Superficial right lower extremity wound has completely healed -he has trace leg edema   4. Episode of fever, left axillary adenopathy and skin rash in Sep 2020 -resolved spontaneously, likely virus related. COVID negative   PLAN:  -Lab reviewed, will continue Sprycel -Lab and follow-up in 3 months -Will call him with today's BCR/ABL result when It returns     No problem-specific Assessment & Plan notes found for this encounter.   No orders of the defined types were placed in this  encounter.  All questions were answered. The patient knows to call the clinic with any problems, questions or concerns. No barriers to learning was detected. I spent 15 minutes counseling the patient face to face. The total time spent in the appointment was 20 minutes and more than 50% was on counseling and review of test results     YTruitt Merle MD 08/08/2019   I, AJoslyn Devon am acting as scribe for YTruitt Merle MD.   I have reviewed the above documentation for accuracy and completeness, and I agree with the above.

## 2019-08-08 ENCOUNTER — Other Ambulatory Visit: Payer: Self-pay

## 2019-08-08 ENCOUNTER — Encounter: Payer: Self-pay | Admitting: Hematology

## 2019-08-08 ENCOUNTER — Inpatient Hospital Stay: Payer: 59 | Attending: Hematology

## 2019-08-08 ENCOUNTER — Telehealth: Payer: Self-pay | Admitting: Hematology

## 2019-08-08 ENCOUNTER — Inpatient Hospital Stay (HOSPITAL_BASED_OUTPATIENT_CLINIC_OR_DEPARTMENT_OTHER): Payer: 59 | Admitting: Hematology

## 2019-08-08 VITALS — BP 102/76 | HR 98 | Temp 98.6°F | Resp 18 | Ht 76.0 in | Wt 297.1 lb

## 2019-08-08 DIAGNOSIS — I428 Other cardiomyopathies: Secondary | ICD-10-CM | POA: Diagnosis not present

## 2019-08-08 DIAGNOSIS — I4819 Other persistent atrial fibrillation: Secondary | ICD-10-CM | POA: Diagnosis not present

## 2019-08-08 DIAGNOSIS — C9210 Chronic myeloid leukemia, BCR/ABL-positive, not having achieved remission: Secondary | ICD-10-CM

## 2019-08-08 DIAGNOSIS — I110 Hypertensive heart disease with heart failure: Secondary | ICD-10-CM | POA: Diagnosis not present

## 2019-08-08 DIAGNOSIS — I509 Heart failure, unspecified: Secondary | ICD-10-CM | POA: Insufficient documentation

## 2019-08-08 DIAGNOSIS — E669 Obesity, unspecified: Secondary | ICD-10-CM

## 2019-08-08 DIAGNOSIS — I4891 Unspecified atrial fibrillation: Secondary | ICD-10-CM | POA: Diagnosis not present

## 2019-08-08 LAB — CMP (CANCER CENTER ONLY)
ALT: 27 U/L (ref 0–44)
AST: 24 U/L (ref 15–41)
Albumin: 4 g/dL (ref 3.5–5.0)
Alkaline Phosphatase: 52 U/L (ref 38–126)
Anion gap: 9 (ref 5–15)
BUN: 24 mg/dL — ABNORMAL HIGH (ref 8–23)
CO2: 26 mmol/L (ref 22–32)
Calcium: 9.2 mg/dL (ref 8.9–10.3)
Chloride: 106 mmol/L (ref 98–111)
Creatinine: 1.26 mg/dL — ABNORMAL HIGH (ref 0.61–1.24)
GFR, Est AFR Am: >60 mL/min (ref >60)
GFR, Estimated: 60 mL/min — ABNORMAL LOW (ref >60)
Glucose, Bld: 103 mg/dL — ABNORMAL HIGH (ref 70–99)
Potassium: 4.5 mmol/L (ref 3.5–5.1)
Sodium: 141 mmol/L (ref 135–145)
Total Bilirubin: 0.8 mg/dL (ref 0.3–1.2)
Total Protein: 6.8 g/dL (ref 6.5–8.1)

## 2019-08-08 LAB — CBC WITH DIFFERENTIAL (CANCER CENTER ONLY)
Abs Immature Granulocytes: 0.01 10*3/uL (ref 0.00–0.07)
Basophils Absolute: 0 10*3/uL (ref 0.0–0.1)
Basophils Relative: 0 %
Eosinophils Absolute: 0 10*3/uL (ref 0.0–0.5)
Eosinophils Relative: 0 %
HCT: 38 % — ABNORMAL LOW (ref 39.0–52.0)
Hemoglobin: 12.8 g/dL — ABNORMAL LOW (ref 13.0–17.0)
Immature Granulocytes: 0 %
Lymphocytes Relative: 22 %
Lymphs Abs: 1 10*3/uL (ref 0.7–4.0)
MCH: 33 pg (ref 26.0–34.0)
MCHC: 33.7 g/dL (ref 30.0–36.0)
MCV: 97.9 fL (ref 80.0–100.0)
Monocytes Absolute: 0.6 10*3/uL (ref 0.1–1.0)
Monocytes Relative: 12 %
Neutro Abs: 2.9 10*3/uL (ref 1.7–7.7)
Neutrophils Relative %: 66 %
Platelet Count: 162 10*3/uL (ref 150–400)
RBC: 3.88 MIL/uL — ABNORMAL LOW (ref 4.22–5.81)
RDW: 14 % (ref 11.5–15.5)
WBC Count: 4.5 10*3/uL (ref 4.0–10.5)
nRBC: 0 % (ref 0.0–0.2)

## 2019-08-08 MED FILL — FLUTICASONE PROP 50 MCG SPR: 50 | 30 days supply | Qty: 16 | Fill #4

## 2019-08-08 NOTE — Telephone Encounter (Signed)
Scheduled per 10/21 los, patient received after visit summary and calender. °

## 2019-08-15 LAB — BCR/ABL

## 2019-08-15 MED FILL — LISINOPRIL 5 MG TABS: 5 | 90 days supply | Qty: 90 | Fill #2

## 2019-08-22 MED FILL — ELIQUIS 5 MG TABLET: 5 | 30 days supply | Qty: 60 | Fill #8

## 2019-08-28 MED FILL — SPRYCEL 100 MG TABLET: 100 | 30 days supply | Qty: 30 | Fill #2

## 2019-08-29 MED FILL — ATORVASTATIN 10 MG TABLET: 10 | 90 days supply | Qty: 45 | Fill #2

## 2019-09-19 MED FILL — ELIQUIS 5 MG TABLET: 5 | 30 days supply | Qty: 60 | Fill #9

## 2019-09-24 ENCOUNTER — Other Ambulatory Visit: Payer: Self-pay | Admitting: Hematology

## 2019-09-24 DIAGNOSIS — C9210 Chronic myeloid leukemia, BCR/ABL-positive, not having achieved remission: Secondary | ICD-10-CM

## 2019-09-26 MED FILL — FLUTICASONE PROP 50 MCG SPR: 50 | 30 days supply | Qty: 16 | Fill #5

## 2019-09-26 MED FILL — FUROSEMIDE 80 MG TAB: 80 | 90 days supply | Qty: 90 | Fill #2

## 2019-09-26 MED FILL — SPRYCEL 100 MG TABLET: 100 | 30 days supply | Qty: 30 | Fill #0

## 2019-10-10 MED FILL — METOPROLOL SUCCINATE ER 200: 200 | 90 days supply | Qty: 135 | Fill #2

## 2019-10-24 ENCOUNTER — Telehealth: Payer: Self-pay | Admitting: Family Medicine

## 2019-10-24 MED FILL — ELIQUIS 5 MG TABLET: 5 | 30 days supply | Qty: 60 | Fill #10

## 2019-10-24 NOTE — Telephone Encounter (Signed)
Pt contacted pharmacy for refills on his SPRYCEL 100 MG tablet  And ELIQUIS 5 MG TABS tablet And was advised that these needed PA . / Pt no longer has [place] insurance and now has [employer] medicare/ please advise

## 2019-10-24 NOTE — Telephone Encounter (Signed)
See request °

## 2019-10-26 NOTE — Telephone Encounter (Signed)
Can you please call patient and get updated insurance so that we can work on [place]?

## 2019-10-26 NOTE — Telephone Encounter (Signed)
Lvm for patient to call back so we can get his new insurance info.

## 2019-10-30 NOTE — Telephone Encounter (Signed)
Patient called back insurance has been updated.

## 2019-10-31 MED FILL — SPRYCEL 100 MG TABLET: 100 | 30 days supply | Qty: 30 | Fill #1

## 2019-11-01 NOTE — Progress Notes (Signed)
[place]   Telephone:([phone]) [phone] Fax:(336) [phone]   Clinic Follow up Note   Patient Care Team: Marin Olp, MD as PCP - General (Family Medicine) Evans Lance, MD as Consulting Physician (Cardiology) Hayden Pedro, MD as Consulting Physician (Ophthalmology)  Date of Service:  11/07/2019  CHIEF COMPLAINT: F/u for CML, Chronic phase   RESPONSE EVALUATION:  Complete hematological response: <1 month MMR: yes, 2 months after starting Desatinib   Bcr/abl IS 03/10/18: 41.28% 06/07/18: 16.96% 09/06/18: 3.78% 12/06/18: 1.7953%  03/07/19:1.27% 05/09/2019: 0.0999% 08/08/19: 0.0287%  CURRENT THERAPY:  StartedGleevec 448m daily on 03/15/18, switched to desatinib on 03/29/2019  INTERVAL HISTORY:  [name] here for a follow up of CML. He presents to the clinic alone. He notes he is doing well with no new changes. He notes occasionally when he stands he has dizziness. He feels he is drinking enough water. He is still on Lasix. He notes his loose stool is stable. He denies nausea, cough or SOB. He notes he has dry cough which he attributes to his Afib during his sleep. He denies fever or chest pain.  He notes he plans to start COVID vaccination next month.     REVIEW OF SYSTEMS:   Constitutional: Denies fevers, chills or abnormal weight loss (+) Occasional dizziness upon standing.  Eyes: Denies blurriness of vision Ears, nose, mouth, throat, and face: Denies mucositis or sore throat Respiratory: Denies dyspnea or wheezes (+) night time dry cough  Cardiovascular: Denies palpitation, chest discomfort (+)B/l lower extremity swelling Gastrointestinal:  Denies nausea, heartburn or change in bowel habits Skin: Denies abnormal skin rashes Lymphatics: Denies new lymphadenopathy or easy bruising Neurological:Denies numbness, tingling or new weaknesses Behavioral/Psych: Mood is stable, no new changes  All other systems were reviewed with the  patient and are negative.  MEDICAL HISTORY:  Past Medical History:  Diagnosis Date  . Atrial fibrillation ([place]   . Diverticulosis   . Hx of adenomatous colonic polyps   . Hyperlipidemia   . Hypertension   . Vitreous anomalies 2014   Tack down of vitreous tear    SURGICAL HISTORY: Past Surgical History:  Procedure Laterality Date  . CARDIAC CATHETERIZATION N/A 04/30/2015   Procedure: Left Heart Cath and Coronary Angiography;  Surgeon: MJerline Pain MD;  [facility];  Service: Cardiovascular;  Laterality: N/A;  . CARDIOVERSION N/A 11/20/2014   Procedure: CARDIOVERSION;  Surgeon: KPixie Casino MD;  [facility];  Service: Cardiovascular;  Laterality: N/A;  11:38 Etomidate given IV for unsuccessful synched cardioversion @ 150 joules from Afib, 11:39 repeated synched cardioversion @ 200 joules successfully from Afib to SR. 12 lead EKG ordered to confirm..  . COLONOSCOPY    . COLONOSCOPY N/A 12/24/2015   Procedure: COLONOSCOPY ( ANAPHALAXIS ALLERGY TO EGGS);  Surgeon: CGatha Mayer MD;  [facility];  Service: Endoscopy;  Laterality: N/A;  anaphalaxis allergy to eggs  . EYE SURGERY  2014    I have reviewed the social history and family history with the patient and they are unchanged from previous note.  ALLERGIES:  is allergic to eggs or egg-derived products.  MEDICATIONS:  Current Outpatient Medications  Medication Sig Dispense Refill  . atorvastatin (LIPITOR) 10 MG tablet Take 1 tablet by mouth every other day. 45 tablet 3  . ELIQUIS 5 MG TABS tablet TAKE 1 TABLET BY MOUTH 2 TIMES DAILY. 60 tablet 11  . fluticasone (FLONASE) 50 MCG/ACT nasal spray INSTILL 2 SPRAYS  INTO BOTH NOSTRILS DAILY 16 g 6  . furosemide (LASIX) 80 MG tablet TAKE 1 TABLET BY MOUTH DAILY. 90 tablet 2  . lisinopril (ZESTRIL) 5 MG tablet TAKE 1 TABLET BY MOUTH DAILY. PLEASE KEEP UPCOMING APPT FOR FUTURE REFILLS. 30 tablet 11  . metoprolol (TOPROL-XL) 200 MG 24 hr tablet  TAKE 1/2 TAB IN THE MORNING AND 1 TAB AT NIGHT. 135 tablet 2  . SPRYCEL 100 MG tablet TAKE 1 TABLET (100 MG TOTAL) BY MOUTH DAILY. MAY TAKE WITH FOOD TO DECREASE STOMACH IRRITATION. 30 tablet 2   No current facility-administered medications for this visit.    PHYSICAL EXAMINATION: ECOG PERFORMANCE STATUS: 0 - Asymptomatic  Vitals:   11/07/19 0826  BP: 98/70  Pulse: (!) 105  Resp: 18  Temp: 97.9 F (36.6 C)  SpO2: 99%   Filed Weights   11/07/19 0826  Weight: 290 lb 9.6 oz (131.8 kg)    GENERAL:alert, no distress and comfortable SKIN: skin color, texture, turgor are normal, no rashes or significant lesions EYES: normal, Conjunctiva are pink and non-injected, sclera clear  NECK: supple, thyroid normal size, non-tender, without nodularity LYMPH:  no palpable lymphadenopathy in the cervical, axillary  LUNGS: clear to auscultation and percussion with normal breathing effort HEART: regular rate & rhythm and no murmurs (+) Mild lower extremity edema ABDOMEN:abdomen soft, non-tender and normal bowel sounds Musculoskeletal:no cyanosis of digits and no clubbing  NEURO: alert & oriented x 3 with fluent speech, no focal motor/sensory deficits  LABORATORY DATA:  I have reviewed the data as listed CBC Latest Ref Rng & Units 11/07/2019 08/08/2019 05/09/2019  WBC 4.0 - 10.5 K/uL 5.1 4.5 4.4  Hemoglobin 13.0 - 17.0 g/dL 13.7 12.8(L) 12.8(L)  Hematocrit 39.0 - 52.0 % 40.8 38.0(L) 38.1(L)  Platelets 150 - 400 K/uL 160 162 141(L)     CMP Latest Ref Rng & Units 08/08/2019 05/09/2019 03/07/2019  Glucose 70 - 99 mg/dL 103(H) 112(H) 100(H)  BUN 8 - 23 mg/dL 24(H) 24(H) 22  Creatinine 0.61 - 1.24 mg/dL 1.26(H) 1.33(H) 1.41(H)  Sodium 135 - 145 mmol/L 141 143 141  Potassium 3.5 - 5.1 mmol/L 4.5 4.3 4.8  Chloride 98 - 111 mmol/L 106 107 105  CO2 22 - 32 mmol/L _0 Calcium 8.9 - 10.3 mg/dL 9.2 9.3 8.7(L)  Total Protein 6.5 - 8.1 g/dL 6.8 6.6 6.4(L)  Total Bilirubin 0.3 - 1.2 mg/dL 0.8 0.8  1.1  Alkaline Phos 38 - 126 U/L 52 53 52  AST 15 - 41 U/L _1 ALT 0 - 44 U/L _2 RADIOGRAPHIC STUDIES: I have personally reviewed the radiological images as listed and agreed with the findings in the report. No results found.   ASSESSMENT & PLAN:  [name] Aguilar is a 64 y.o. male with    1. Chronic Myelogenous Leukocytosis, Chronic phase, in MMR -He was diagnosed in May 2019.  -Hewasstarted Gleevec at the end of May 2019, tolerating very well. -He hadachieved complete hematological response within the first month, but did not havecomplete molecular remissionin 1 year, mutation test was negative, so he was switched to Dasatinib in June 2020. He has had rapid response and archived MMR in July 2020. -He continues to tolerate Dasatinib well with stable mild loose stool. Physical exam unremarkable with mild LE edema, will monitor. Labs reviewed, CBC normal. His tumor marker continues to trend down in MMR. BCR/ABL from today is still pending.  -Continue Dasatinib.  I discussed if he is able to remain in MMR for 2 years we can stop Dasatinib. We discussed the data on that. If recurs we can retreat. He understands.  -F/u in 3 months  -He plans to start [place] Vaccination in 11/2019. I encouraged him to proceed and continue with COVID precautions.    2. Atrial Fibrillation and ischemic congestive heart failure with EF 25-30% 02/2015 -On treatment with Eliquis, Lipitor, Lasix, lisinopril, metoprolol and Verapamil.  -Followed by Cardiologist Dr. Janit Pagan and PCP -LastECHO from 03/14/18 shows his EF has improved to 55-60%  -Doing well medically, asymptomatic with recent dry cough at night.   3. LE edema and wound  -Secondary to CHF and leukocytosis -Much improved withLasix 48m dailyand CML treatment, continue -Superficial right lower extremity wound has completely healed -He has trace leg edema   4. Episode of fever, left axillary adenopathy and skin  rash in Sep 2020 -resolved spontaneously, likely virus related. COVID negative   PLAN: -Lab reviewed, will continue Sprycel -Lab and follow-up in 3 months -Will send message with today's BCR/ABL result when It returns    No problem-specific Assessment & Plan notes found for this encounter.   No orders of the defined types were placed in this encounter.  All questions were answered. The patient knows to call the clinic with any problems, questions or concerns. No barriers to learning was detected. The total time spent in the appointment was 25 minutes.     YTruitt Merle MD 11/07/2019   I, AJoslyn Devon am acting as scribe for YTruitt Merle MD.   I have reviewed the above documentation for accuracy and completeness, and I agree with the above.

## 2019-11-05 ENCOUNTER — Telehealth: Payer: Self-pay

## 2019-11-05 NOTE — Telephone Encounter (Signed)
Sprycel is not given by our office.   I have started auth for Eliquis with information below   Your PA has been faxed to the plan as a paper copy. Please contact the plan directly if you haven't received a determination in a typical timeframe.  You will be notified of the determination via fax. How do I know if the plan approved the PA?  Add Reminder to your Dashboard Remind me in:  5 business days Contact plan to follow up on [facility]

## 2019-11-07 ENCOUNTER — Inpatient Hospital Stay (HOSPITAL_BASED_OUTPATIENT_CLINIC_OR_DEPARTMENT_OTHER): Payer: PPO | Admitting: Hematology

## 2019-11-07 ENCOUNTER — Other Ambulatory Visit: Payer: Self-pay

## 2019-11-07 ENCOUNTER — Encounter: Payer: Self-pay | Admitting: Hematology

## 2019-11-07 ENCOUNTER — Inpatient Hospital Stay: Payer: PPO | Attending: Hematology

## 2019-11-07 VITALS — BP 98/70 | HR 105 | Temp 97.9°F | Resp 18 | Ht 76.0 in | Wt 290.6 lb

## 2019-11-07 DIAGNOSIS — I4891 Unspecified atrial fibrillation: Secondary | ICD-10-CM | POA: Diagnosis not present

## 2019-11-07 DIAGNOSIS — C9210 Chronic myeloid leukemia, BCR/ABL-positive, not having achieved remission: Secondary | ICD-10-CM

## 2019-11-07 DIAGNOSIS — I110 Hypertensive heart disease with heart failure: Secondary | ICD-10-CM | POA: Diagnosis not present

## 2019-11-07 DIAGNOSIS — I509 Heart failure, unspecified: Secondary | ICD-10-CM | POA: Insufficient documentation

## 2019-11-07 LAB — CBC WITH DIFFERENTIAL (CANCER CENTER ONLY)
Abs Immature Granulocytes: 0 10*3/uL (ref 0.00–0.07)
Basophils Absolute: 0 10*3/uL (ref 0.0–0.1)
Basophils Relative: 0 %
Eosinophils Absolute: 0 10*3/uL (ref 0.0–0.5)
Eosinophils Relative: 0 %
HCT: 40.8 % (ref 39.0–52.0)
Hemoglobin: 13.7 g/dL (ref 13.0–17.0)
Immature Granulocytes: 0 %
Lymphocytes Relative: 22 %
Lymphs Abs: 1.1 10*3/uL (ref 0.7–4.0)
MCH: 32.5 pg (ref 26.0–34.0)
MCHC: 33.6 g/dL (ref 30.0–36.0)
MCV: 96.9 fL (ref 80.0–100.0)
Monocytes Absolute: 0.6 10*3/uL (ref 0.1–1.0)
Monocytes Relative: 12 %
Neutro Abs: 3.3 10*3/uL (ref 1.7–7.7)
Neutrophils Relative %: 66 %
Platelet Count: 160 10*3/uL (ref 150–400)
RBC: 4.21 MIL/uL — ABNORMAL LOW (ref 4.22–5.81)
RDW: 13.5 % (ref 11.5–15.5)
WBC Count: 5.1 10*3/uL (ref 4.0–10.5)
nRBC: 0 % (ref 0.0–0.2)

## 2019-11-07 LAB — CMP (CANCER CENTER ONLY)
ALT: 23 U/L (ref 0–44)
AST: 23 U/L (ref 15–41)
Albumin: 4.1 g/dL (ref 3.5–5.0)
Alkaline Phosphatase: 57 U/L (ref 38–126)
Anion gap: 8 (ref 5–15)
BUN: 22 mg/dL (ref 8–23)
CO2: 28 mmol/L (ref 22–32)
Calcium: 9 mg/dL (ref 8.9–10.3)
Chloride: 105 mmol/L (ref 98–111)
Creatinine: 1.29 mg/dL — ABNORMAL HIGH (ref 0.61–1.24)
GFR, Est AFR Am: >60 mL/min (ref >60)
GFR, Estimated: 58 mL/min — ABNORMAL LOW (ref >60)
Glucose, Bld: 85 mg/dL (ref 70–99)
Potassium: 4.5 mmol/L (ref 3.5–5.1)
Sodium: 141 mmol/L (ref 135–145)
Total Bilirubin: 0.7 mg/dL (ref 0.3–1.2)
Total Protein: 6.9 g/dL (ref 6.5–8.1)

## 2019-11-07 MED FILL — FLUTICASONE PROP 50 MCG SPR: 50 | 30 days supply | Qty: 16 | Fill #6

## 2019-11-13 MED FILL — LISINOPRIL 5 MG TABS: 5 | 90 days supply | Qty: 90 | Fill #3

## 2019-11-14 ENCOUNTER — Ambulatory Visit: Payer: PPO

## 2019-11-19 ENCOUNTER — Telehealth: Payer: Self-pay

## 2019-11-19 NOTE — Telephone Encounter (Signed)
Mychart message

## 2019-11-21 LAB — BCR/ABL

## 2019-11-21 MED FILL — ELIQUIS 5 MG TABLET: 5 | 30 days supply | Qty: 60 | Fill #11

## 2019-11-23 ENCOUNTER — Ambulatory Visit: Payer: PPO | Attending: Internal Medicine

## 2019-11-23 DIAGNOSIS — Z23 Encounter for immunization: Secondary | ICD-10-CM

## 2019-11-23 NOTE — Progress Notes (Signed)
   Covid-19 Vaccination Clinic  Name:  [name] Aguilar    MRN: [id number] DOB: 05/20/1956  11/23/2019  [name] was observed post Covid-19 immunization for 15 minutes without incidence. He was provided with Vaccine Information Sheet and instruction to access the V-Safe system.   [name] was instructed to call 911 with any severe reactions post vaccine: Marland Kitchen Difficulty breathing  . Swelling of your face and throat  . A fast heartbeat  . A bad rash all over your body  . Dizziness and weakness    Immunizations Administered    Name Date Dose VIS Date Route   Pfizer COVID-19 Vaccine 11/23/2019  8:16 AM 0.3 mL 09/28/2019 Intramuscular   Manufacturer: [place]   Lot: [id number]   [place]: [id number]

## 2019-11-27 MED FILL — SPRYCEL 100 MG TABLET: 100 | 30 days supply | Qty: 30 | Fill #2

## 2019-12-05 ENCOUNTER — Encounter: Payer: Self-pay | Admitting: Internal Medicine

## 2019-12-05 ENCOUNTER — Other Ambulatory Visit: Payer: Self-pay

## 2019-12-05 ENCOUNTER — Ambulatory Visit: Payer: PPO

## 2019-12-05 ENCOUNTER — Ambulatory Visit: Payer: PPO | Admitting: Internal Medicine

## 2019-12-05 VITALS — BP 100/70 | HR 94 | Ht 76.0 in | Wt 292.4 lb

## 2019-12-05 DIAGNOSIS — I428 Other cardiomyopathies: Secondary | ICD-10-CM | POA: Diagnosis not present

## 2019-12-05 DIAGNOSIS — E785 Hyperlipidemia, unspecified: Secondary | ICD-10-CM | POA: Diagnosis not present

## 2019-12-05 DIAGNOSIS — I4819 Other persistent atrial fibrillation: Secondary | ICD-10-CM | POA: Diagnosis not present

## 2019-12-05 NOTE — Patient Instructions (Signed)

## 2019-12-05 NOTE — Progress Notes (Signed)
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 He has done well in the interim. He remains active practicing veterinary medicine. He had normalization of his LV function by echo. He has not had syncope. No angina. He admits to not exercising as much and gaining weight in the time of Covid.  Allergies  Allergen Reactions  . Eggs Or Egg-Derived Products Anaphylaxis    REACTION: no vaccines     Current Outpatient Medications  Medication Sig Dispense Refill  . atorvastatin (LIPITOR) 10 MG tablet Take 1 tablet by mouth every other day. 45 tablet 3  . ELIQUIS 5 MG TABS tablet TAKE 1 TABLET BY MOUTH 2 TIMES DAILY. 60 tablet 11  . fluticasone (FLONASE) 50 MCG/ACT nasal spray INSTILL 2 SPRAYS INTO BOTH NOSTRILS DAILY 16 g 6  . furosemide (LASIX) 80 MG tablet TAKE 1 TABLET BY MOUTH DAILY. 90 tablet 2  . lisinopril (ZESTRIL) 5 MG tablet TAKE 1 TABLET BY MOUTH DAILY. PLEASE KEEP UPCOMING APPT FOR FUTURE REFILLS. 30 tablet 11  . metoprolol (TOPROL-XL) 200 MG 24 hr tablet TAKE 1/2 TAB IN THE MORNING AND 1 TAB AT NIGHT. 135 tablet 2  . SPRYCEL 100 MG tablet TAKE 1 TABLET (100 MG TOTAL) BY MOUTH DAILY. MAY TAKE WITH FOOD TO DECREASE STOMACH IRRITATION. 30 tablet 2   No current facility-administered medications for this visit.     Past Medical History:  Diagnosis Date  . Atrial fibrillation ([place])   . Diverticulosis   . Hx of adenomatous colonic polyps   . Hyperlipidemia   . Hypertension   . Vitreous anomalies 2014   Tack down of vitreous tear    ROS:   All systems reviewed and negative except as noted in the HPI.   Past Surgical History:  Procedure Laterality Date  . CARDIAC CATHETERIZATION N/A 04/30/2015   Procedure: Left Heart Cath and Coronary Angiography;  Surgeon: Jerline Pain, MD;  Location: [place] [facility];  Service: Cardiovascular;  Laterality: N/A;  . CARDIOVERSION N/A 11/20/2014   Procedure: CARDIOVERSION;   Surgeon: Pixie Casino, MD;  [facility];  Service: Cardiovascular;  Laterality: N/A;  11:38 Etomidate given IV for unsuccessful synched cardioversion @ 150 joules from Afib, 11:39 repeated synched cardioversion @ 200 joules successfully from Afib to SR. 12 lead EKG ordered to confirm..  . COLONOSCOPY    . COLONOSCOPY N/A 12/24/2015   Procedure: COLONOSCOPY ( ANAPHALAXIS ALLERGY TO EGGS);  Surgeon: Gatha Mayer, MD;  [facility];  Service: Endoscopy;  Laterality: N/A;  anaphalaxis allergy to eggs  . EYE SURGERY  2014     Family History  Problem Relation Age of Onset  . Asthma Mother   . Alzheimer's disease Mother        diagnosed 16  . Brain cancer Father        not genetic  . Other Brother        gilbert disease  . Heart attack Maternal Grandmother   . Heart attack Maternal Uncle   . Colon cancer Neg Hx   . Esophageal cancer Neg Hx   . Pancreatic cancer Neg Hx   . Liver disease Neg Hx      Social History   Socioeconomic History  . Marital status: Married    Spouse name: Not on file  . Number of children: Not on file  . Years of education: Not on file  .  Highest education level: Not on file  Occupational History  . Not on file  Tobacco Use  . Smoking status: Never Smoker  . Smokeless tobacco: Never Used  Substance and Sexual Activity  . Alcohol use: Yes    Alcohol/week: 0.0 standard drinks    Comment: 1 drink once a month max  . Drug use: No  . Sexual activity: Yes  Other Topics Concern  . Not on file  Social History Narrative   Married 1977. 2 children (son in doctorate program at [employer] nearing end and daughter with [employer] in [place]). 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 3 dogs, 1 cat, 1 snake.      Hobbies: fishing on reservoirs in [place], family time, nature, hiking   Social Determinants of Health    Financial Resource Strain:   . Difficulty of Paying Living Expenses: Not on file  Food Insecurity:   . Worried About Charity fundraiser in the Last Year: Not on file  . Ran Out of Food in the Last Year: Not on file  Transportation Needs:   . Lack of Transportation (Medical): Not on file  . Lack of Transportation (Non-Medical): Not on file  Physical Activity:   . Days of Exercise per Week: Not on file  . Minutes of Exercise per Session: Not on file  Stress:   . Feeling of Stress : Not on file  Social Connections:   . Frequency of Communication with Friends and Family: Not on file  . Frequency of Social Gatherings with Friends and Family: Not on file  . Attends Religious Services: Not on file  . Active Member of Clubs or Organizations: Not on file  . Attends Archivist Meetings: Not on file  . Marital Status: Not on file  Intimate Partner Violence:   . Fear of Current or Ex-Partner: Not on file  . Emotionally Abused: Not on file  . Physically Abused: Not on file  . Sexually Abused: Not on file     BP 100/70   Pulse 94   Ht 6\' 4"  (1.93 m)   Wt 292 lb 6.4 oz (132.6 kg)   SpO2 90%   BMI 35.59 kg/m   Physical Exam:  Well appearing NAD HEENT: Unremarkable Neck:  No JVD, no thyromegally Lymphatics:  No adenopathy Back:  No CVA tenderness Lungs:  Clear with no wheezes HEART:  Regular rate rhythm, no murmurs, no rubs, no clicks Abd:  soft, positive bowel sounds, no organomegally, no rebound, no guarding Ext:  2 plus pulses, no edema, no cyanosis, no clubbing Skin:  No rashes no nodules Neuro:  CN II through XII intact, motor grossly intact  EKG - atrial fib with a CVR  Assess/Plan: 1. Atrial fib - his VR appears to be well controlled on high dose beta blocker 2. HTN -his bp is well controlled. He will continue his current meds. 3. Obesity - I encouraged the patient to work on weight loss. 4. Chronic systolic/diastolic heart failure - his symptoms are class  1. He will continue his current meds.  Mikle Bosworth.D.

## 2019-12-12 ENCOUNTER — Other Ambulatory Visit: Payer: Self-pay | Admitting: Family Medicine

## 2019-12-12 MED FILL — ATORVASTATIN 10 MG TABLET: 10 | 90 days supply | Qty: 45 | Fill #3

## 2019-12-12 MED FILL — FLUTICASONE PROP 50 MCG SPR: 50 | 30 days supply | Qty: 16 | Fill #0

## 2019-12-19 ENCOUNTER — Other Ambulatory Visit: Payer: Self-pay | Admitting: Family Medicine

## 2019-12-19 ENCOUNTER — Other Ambulatory Visit: Payer: Self-pay | Admitting: Hematology

## 2019-12-19 ENCOUNTER — Ambulatory Visit: Payer: PPO | Attending: Internal Medicine

## 2019-12-19 ENCOUNTER — Other Ambulatory Visit: Payer: Self-pay | Admitting: Internal Medicine

## 2019-12-19 DIAGNOSIS — C9210 Chronic myeloid leukemia, BCR/ABL-positive, not having achieved remission: Secondary | ICD-10-CM

## 2019-12-19 DIAGNOSIS — Z23 Encounter for immunization: Secondary | ICD-10-CM | POA: Insufficient documentation

## 2019-12-19 MED FILL — ELIQUIS 5 MG TABLET: 5 | 30 days supply | Qty: 60 | Fill #0

## 2019-12-19 NOTE — Progress Notes (Signed)
   Covid-19 Vaccination Clinic  Name:  [name] Aguilar    MRN: [id number] DOB: 01/11/1956  12/19/2019  [name] Aguilar was observed post Covid-19 immunization for 15 minutes without incident. He was provided with Vaccine Information Sheet and instruction to access the V-Safe system.   [name] Aguilar was instructed to call 911 with any severe reactions post vaccine: Marland Kitchen Difficulty breathing  . Swelling of face and throat  . A fast heartbeat  . A bad rash all over body  . Dizziness and weakness   Immunizations Administered    Name Date Dose VIS Date Route   Pfizer COVID-19 Vaccine 12/19/2019  8:14 AM 0.3 mL 09/28/2019 Intramuscular   Manufacturer: [place]   Lot: [id number]   [place]: [id number]

## 2019-12-20 MED FILL — METOPROLOL SUCCINATE ER 200: 200 | 90 days supply | Qty: 135 | Fill #0

## 2019-12-20 MED FILL — FUROSEMIDE 80 MG TAB: 80 | 90 days supply | Qty: 90 | Fill #0

## 2019-12-25 MED FILL — SPRYCEL 100 MG TABLET: 100 | 30 days supply | Qty: 30 | Fill #0

## 2020-01-03 NOTE — Telephone Encounter (Signed)
error 

## 2020-01-09 ENCOUNTER — Encounter: Payer: Self-pay | Admitting: Family Medicine

## 2020-01-22 MED FILL — ELIQUIS 5 MG TABLET: 5 | 30 days supply | Qty: 60 | Fill #1

## 2020-01-22 MED FILL — SPRYCEL 100 MG TABLET: 100 | 30 days supply | Qty: 30 | Fill #1

## 2020-01-23 MED FILL — FLUTICASONE PROP 50 MCG SPR: 50 | 30 days supply | Qty: 16 | Fill #1

## 2020-01-31 NOTE — Progress Notes (Signed)
[place]   Telephone:([phone]) [phone] Fax:(336) [phone]   Clinic Follow up Note   Patient Care Team: Marin Olp, MD as PCP - General (Family Medicine) Evans Lance, MD as Consulting Physician (Cardiology) Hayden Pedro, MD as Consulting Physician (Ophthalmology)  Date of Service:  02/06/2020  CHIEF COMPLAINT: F/u for CML, Chronic phase  RESPONSE EVALUATION:  Complete hematological response: <1 month MMR:yes, 2 months after starting Sprycel  Bcr/abl IS 03/10/18: 41.28% 06/07/18: 16.96% 09/06/18: 3.78% 12/06/18: 1.7953%  03/07/19:1.27% 05/09/2019: 0.0999% 08/08/19: 0.0287% 11/07/19: MMR, not detectable   CURRENT THERAPY: StartedGleevec 424m daily on 03/15/18, switched to Sprycel on 03/29/2019  INTERVAL HISTORY:  [name] Aguilar here for a follow up of CML. He presents to the clinic alone. He notes he is doing well. He denies any new changes. He notes recent skin rash on his left clavicle area since 07/2019, 6 months ago. He notes this only itches some but does not limit what he does. He has not tried anything topical. He denies skin rash anywhere else or swelling. He has LE edema occasionally. He notes his breathing is baseline based on allergies and Afib. He notes he is managing the cost of Sprycel on his Medicaid so far.    REVIEW OF SYSTEMS:   Constitutional: Denies fevers, chills or abnormal weight loss Eyes: Denies blurriness of vision Ears, nose, mouth, throat, and face: Denies mucositis or sore throat Respiratory: Denies cough, dyspnea or wheezes Cardiovascular: Denies palpitation, chest discomfort or lower extremity swelling Gastrointestinal:  Denies nausea, heartburn or change in bowel habits Skin: (+) Skin rash over left clavicle  Lymphatics: Denies new lymphadenopathy or easy bruising Neurological:Denies numbness, tingling or new weaknesses Behavioral/Psych: Mood is stable, no new changes  All other systems were reviewed with  the patient and are negative.  MEDICAL HISTORY:  Past Medical History:  Diagnosis Date  . Atrial [place] ([place]   . Diverticulosis   . Hx of adenomatous colonic polyps   . Hyperlipidemia   . Hypertension   . Vitreous anomalies 2014   Tack down of vitreous tear    SURGICAL HISTORY: Past Surgical History:  Procedure Laterality Date  . CARDIAC CATHETERIZATION N/A 04/30/2015   Procedure: Left Heart Cath and Coronary Angiography;  Surgeon: MJerline Pain MD;  [facility];  Service: Cardiovascular;  Laterality: N/A;  . CARDIOVERSION N/A 11/20/2014   Procedure: CARDIOVERSION;  Surgeon: KPixie Casino MD;  [facility];  Service: Cardiovascular;  Laterality: N/A;  11:38 Etomidate given IV for unsuccessful synched cardioversion @ 150 joules from Afib, 11:39 repeated synched cardioversion @ 200 joules successfully from Afib to SR. 12 lead EKG ordered to confirm..  . COLONOSCOPY    . COLONOSCOPY N/A 12/24/2015   Procedure: COLONOSCOPY ( ANAPHALAXIS ALLERGY TO EGGS);  Surgeon: CGatha Mayer MD;  [facility];  Service: Endoscopy;  Laterality: N/A;  anaphalaxis allergy to eggs  . EYE SURGERY  2014    I have reviewed the social history and family history with the patient and they are unchanged from previous note.  ALLERGIES:  is allergic to eggs or egg-derived products.  MEDICATIONS:  Current Outpatient Medications  Medication Sig Dispense Refill  . atorvastatin (LIPITOR) 10 MG tablet Take 1 tablet by mouth every other day. 45 tablet 3  . dasatinib (SPRYCEL) 100 MG tablet TAKE 1 TABLET (100 MG TOTAL) BY MOUTH DAILY. MAY TAKE WITH FOOD TO DECREASE STOMACH IRRITATION. 30 tablet 2  . ELIQUIS  5 MG TABS tablet TAKE 1 TABLET BY MOUTH 2 TIMES DAILY. 60 tablet 11  . fluticasone (FLONASE) 50 MCG/ACT nasal spray INSTILL 2 SPRAYS INTO BOTH NOSTRILS DAILY 16 g 6  . furosemide (LASIX) 80 MG tablet TAKE 1 TABLET BY MOUTH DAILY. 90 tablet 3  . lisinopril (ZESTRIL) 5  MG tablet TAKE 1 TABLET BY MOUTH DAILY. PLEASE KEEP UPCOMING APPT FOR FUTURE REFILLS. 90 tablet 11  . metoprolol (TOPROL-XL) 200 MG 24 hr tablet TAKE 1/2 TAB IN THE MORNING AND 1 TAB AT NIGHT. 135 tablet 3   No current facility-administered medications for this visit.    PHYSICAL EXAMINATION: ECOG PERFORMANCE STATUS: 0 - Asymptomatic  Vitals:   02/06/20 0820  BP: 118/63  Pulse: 99  Resp: 18  Temp: (!) 95.6 F (35.3 C)  SpO2: 98%   Filed Weights   02/06/20 0820  Weight: 293 lb 8 oz (133.1 kg)    GENERAL:alert, no distress and comfortable SKIN: skin color, texture, turgor are normal, (+) Mild small area of skin erythema over left clavicle.  EYES: normal, Conjunctiva are pink and non-injected, sclera clear  NECK: supple, thyroid normal size, non-tender, without nodularity LYMPH:  no palpable lymphadenopathy in the cervical, axillary  LUNGS: clear to auscultation and percussion with normal breathing effort HEART: no murmurs and no lower extremity edema (+) A Fib  ABDOMEN:abdomen soft, non-tender and normal bowel sounds Musculoskeletal:no cyanosis of digits and no clubbing  NEURO: alert & oriented x 3 with fluent speech, no focal motor/sensory deficits  LABORATORY DATA:  I have reviewed the data as listed CBC Latest Ref Rng & Units 02/06/2020 11/07/2019 08/08/2019  WBC 4.0 - 10.5 K/uL 4.5 5.1 4.5  Hemoglobin 13.0 - 17.0 g/dL 13.3 13.7 12.8(L)  Hematocrit 39.0 - 52.0 % 40.3 40.8 38.0(L)  Platelets 150 - 400 K/uL 157 160 162     CMP Latest Ref Rng & Units 02/06/2020 11/07/2019 08/08/2019  Glucose 70 - 99 mg/dL 99 85 103(H)  BUN 8 - 23 mg/dL 27(H) 22 24(H)  Creatinine 0.61 - 1.24 mg/dL 1.38(H) 1.29(H) 1.26(H)  Sodium 135 - 145 mmol/L 141 141 141  Potassium 3.5 - 5.1 mmol/L 5.1 4.5 4.5  Chloride 98 - 111 mmol/L 106 105 106  CO2 22 - 32 mmol/L '29 28 26  ' Calcium 8.9 - 10.3 mg/dL 9.5 9.0 9.2  Total Protein 6.5 - 8.1 g/dL 6.9 6.9 6.8  Total Bilirubin 0.3 - 1.2 mg/dL 0.8 0.7 0.8   Alkaline Phos 38 - 126 U/L 57 57 52  AST 15 - 41 U/L '23 23 24  ' ALT 0 - 44 U/L '23 23 27      ' RADIOGRAPHIC STUDIES: I have personally reviewed the radiological images as listed and agreed with the findings in the report. No results found.   ASSESSMENT & PLAN:  [name] Aguilar is a 64 y.o. male with    1. Chronic Myelogenous Leukocytosis, Chronic phase, in MMR -He was diagnosed in May 2019.  -Hewasstarted Gleevec at the end of May 2019, tolerating very well. -He hadachieved complete hematological response within the first month, but did not havecomplete molecular remissionin 1 year, mutation test was negative, so he was switched to Sprycel in June 2020. He has had rapid response and archived MMRin July 2020. -He is clinically doing well and tolerating Sprycel well. Physical exam showed A fib and mild, local skin rash. Labs reviewed, CBC, CMP and today's bcr/abl still pending.  -Continue Sprycel. I discussed if he is able to  remain in MMR for 2 years we can stop Sprycel. We discussed the data on that. If recurs we can retreat. He understands.  -F/u in 3 months    2. Atrial Fibrillation and ischemic congestive heart failure with EF 25-30% 02/2015 -On treatment with Eliquis, Lipitor, Lasix, lisinopril, metoprolol and Verapamil.  -Followed by Cardiologist Dr. Janit Pagan and PCP -LastECHO from 03/14/18 shows his EF has improved to 55-60% -Doing well medically, asymptomatic with recent dry cough at night.   3. LE edema and wound  -Secondary to CHF and leukocytosis -Much improved withLasix 60m dailyand CML treatment, continue -Superficial right lower extremity wound has completely healed -He has trace leg edema   4. Episode of fever, left axillary adenopathy and skin rash in Sep 2020, Current local skin rash -resolvedspontaneously, likely virus related.COVID negative -He does have a small patch of skin rash over left clavicle on today exam (02/06/20). He been  present and stable since 07/2019. Does not appear to be a typical drug rash. I recommend he use OTC hydrocortisone. I recommend he f/u with PCP about this.   PLAN: -Lab reviewed, will continue Sprycel -Lab and follow-up in 3 months -Will send message with today's BCR/ABL result when It returns    No problem-specific Assessment & Plan notes found for this encounter.   No orders of the defined types were placed in this encounter.  All questions were answered. The patient knows to call the clinic with any problems, questions or concerns. No barriers to learning was detected. The total time spent in the appointment was 20 minutes.     YTruitt Merle MD 02/06/2020   I, AJoslyn Devon am acting as scribe for YTruitt Merle MD.   I have reviewed the above documentation for accuracy and completeness, and I agree with the above.

## 2020-02-06 ENCOUNTER — Inpatient Hospital Stay: Payer: PPO

## 2020-02-06 ENCOUNTER — Other Ambulatory Visit: Payer: Self-pay

## 2020-02-06 ENCOUNTER — Encounter: Payer: Self-pay | Admitting: Hematology

## 2020-02-06 ENCOUNTER — Telehealth: Payer: Self-pay | Admitting: Hematology

## 2020-02-06 ENCOUNTER — Inpatient Hospital Stay: Payer: PPO | Attending: Hematology | Admitting: Hematology

## 2020-02-06 VITALS — BP 118/63 | HR 99 | Temp 95.6°F | Resp 18 | Ht 76.0 in | Wt 293.5 lb

## 2020-02-06 DIAGNOSIS — I4891 Unspecified atrial fibrillation: Secondary | ICD-10-CM | POA: Diagnosis not present

## 2020-02-06 DIAGNOSIS — I509 Heart failure, unspecified: Secondary | ICD-10-CM | POA: Diagnosis not present

## 2020-02-06 DIAGNOSIS — C9210 Chronic myeloid leukemia, BCR/ABL-positive, not having achieved remission: Secondary | ICD-10-CM | POA: Diagnosis not present

## 2020-02-06 DIAGNOSIS — R21 Rash and other nonspecific skin eruption: Secondary | ICD-10-CM | POA: Insufficient documentation

## 2020-02-06 LAB — CMP (CANCER CENTER ONLY)
ALT: 23 U/L (ref 0–44)
AST: 23 U/L (ref 15–41)
Albumin: 4 g/dL (ref 3.5–5.0)
Alkaline Phosphatase: 57 U/L (ref 38–126)
Anion gap: 6 (ref 5–15)
BUN: 27 mg/dL — ABNORMAL HIGH (ref 8–23)
CO2: 29 mmol/L (ref 22–32)
Calcium: 9.5 mg/dL (ref 8.9–10.3)
Chloride: 106 mmol/L (ref 98–111)
Creatinine: 1.38 mg/dL — ABNORMAL HIGH (ref 0.61–1.24)
GFR, Est AFR Am: >60 mL/min (ref >60)
GFR, Estimated: 53 mL/min — ABNORMAL LOW (ref >60)
Glucose, Bld: 99 mg/dL (ref 70–99)
Potassium: 5.1 mmol/L (ref 3.5–5.1)
Sodium: 141 mmol/L (ref 135–145)
Total Bilirubin: 0.8 mg/dL (ref 0.3–1.2)
Total Protein: 6.9 g/dL (ref 6.5–8.1)

## 2020-02-06 LAB — CBC WITH DIFFERENTIAL (CANCER CENTER ONLY)
Abs Immature Granulocytes: 0 10*3/uL (ref 0.00–0.07)
Basophils Absolute: 0 10*3/uL (ref 0.0–0.1)
Basophils Relative: 0 %
Eosinophils Absolute: 0 10*3/uL (ref 0.0–0.5)
Eosinophils Relative: 0 %
HCT: 40.3 % (ref 39.0–52.0)
Hemoglobin: 13.3 g/dL (ref 13.0–17.0)
Immature Granulocytes: 0 %
Lymphocytes Relative: 22 %
Lymphs Abs: 1 10*3/uL (ref 0.7–4.0)
MCH: 32.7 pg (ref 26.0–34.0)
MCHC: 33 g/dL (ref 30.0–36.0)
MCV: 99 fL (ref 80.0–100.0)
Monocytes Absolute: 0.5 10*3/uL (ref 0.1–1.0)
Monocytes Relative: 11 %
Neutro Abs: 3 10*3/uL (ref 1.7–7.7)
Neutrophils Relative %: 67 %
Platelet Count: 157 10*3/uL (ref 150–400)
RBC: 4.07 MIL/uL — ABNORMAL LOW (ref 4.22–5.81)
RDW: 13.6 % (ref 11.5–15.5)
WBC Count: 4.5 10*3/uL (ref 4.0–10.5)
nRBC: 0 % (ref 0.0–0.2)

## 2020-02-06 MED ORDER — DASATINIB 100 MG PO TABS: ORAL_TABLET | ORAL | 2 refills | Status: DC

## 2020-02-06 NOTE — Telephone Encounter (Signed)
Scheduled per los. Gave avs and calendar  

## 2020-02-11 ENCOUNTER — Encounter: Payer: Self-pay | Admitting: Hematology

## 2020-02-13 ENCOUNTER — Telehealth: Payer: Self-pay

## 2020-02-13 MED FILL — LISINOPRIL 5 MG TABLET: 5 | 90 days supply | Qty: 90 | Fill #0

## 2020-02-13 NOTE — Telephone Encounter (Signed)
Per Dr. Burr Medico I let [name] Aguilar know his CML is in remission.  He verbalized understanding.

## 2020-02-20 MED FILL — ELIQUIS 5 MG TABLET: 5 | 30 days supply | Qty: 60 | Fill #2

## 2020-03-05 ENCOUNTER — Other Ambulatory Visit: Payer: Self-pay | Admitting: Internal Medicine

## 2020-03-05 ENCOUNTER — Other Ambulatory Visit: Payer: Self-pay | Admitting: Hematology

## 2020-03-05 DIAGNOSIS — E785 Hyperlipidemia, unspecified: Secondary | ICD-10-CM

## 2020-03-10 ENCOUNTER — Other Ambulatory Visit: Payer: Self-pay | Admitting: Hematology

## 2020-03-10 DIAGNOSIS — E785 Hyperlipidemia, unspecified: Secondary | ICD-10-CM

## 2020-03-11 ENCOUNTER — Other Ambulatory Visit: Payer: Self-pay | Admitting: Internal Medicine

## 2020-03-11 DIAGNOSIS — E785 Hyperlipidemia, unspecified: Secondary | ICD-10-CM

## 2020-03-11 MED FILL — ATORVASTATIN CALCIUM 10 MG: 10 | 90 days supply | Qty: 45 | Fill #0

## 2020-03-11 NOTE — Progress Notes (Signed)
Phone: [phone]   Subjective:  Patient presents today for their Welcome to Medicare Exam    Preventive Screening-Counseling & Management  Vision screen:   Hearing Screening   125Hz 250Hz 500Hz 1000Hz 2000Hz 3000Hz 4000Hz 6000Hz 8000Hz  Right ear:           Left ear:             Visual Acuity Screening   Right eye Left eye Both eyes  Without correction:     With correction: 20/25 20/25 20/20    Advanced directives: Full code. Will give blue packet for advanced directives. Advised to consider hcpoa/living will  Modifiable Risk Factors/behavioral risk assessment/psychosocial risk assessment Regular exercise: limited recently - 2 days a week walking Diet: eats healthy foods but needs to work on portion size  Wt Readings from Last 3 Encounters:  03/19/20 291 lb (132 kg)  02/06/20 293 lb 8 oz (133.1 kg)  12/05/19 292 lb 6.4 oz (132.6 kg)  Smoking Status: Never Smoker Second Hand Smoking status: No smokers in home Alcohol intake: very rare alcohol- none in last year  Cardiac risk factors:  advanced age (older than 36 for men, 56 for women)  treated Hyperlipidemia  treated Hypertension  No diabetes. Prediabetes being monitored.  Lab Results  Component Value Date   HGBA1C 6.1 03/14/2019  Family History:  heart attack maternal grandmother and maternal uncle  Depression Screen/risk evaluation Risk factors: none.Marland Kitchen PHQ2 0  Depression screen [facility] 2/9 03/19/2020 03/14/2019 03/01/2018 09/19/2013  Decreased Interest 0 0 0 0  Down, Depressed, Hopeless 0 0 0 0  PHQ - 2 Score 0 0 0 0    Functional ability and level of safety Mobility assessment:  timed get up and go <12 seconds Activities of Daily Living- Independent in ADLs (toileting, bathing, dressing, transferring, eating) and in IADLs (shopping, housekeeping, managing own medications, and handling finances) Home Safety: Loose rugs (no), smoke detectors (up to date), small pets (yes but feels safe), grab bars (no but feels safe),  stairs (2 sets- no difficulty), life-alert system (would use cell phone) Hearing Difficulties: -patient declines Fall Risk: None  Fall Risk  09/19/2013  Falls in the past year? No  Opioid use history:  no long term opioids use Self assessment of health status: "good"  Required Immunizations needed today:   discussed shingrix and pneumovax and opted nto pneumovax- we suspect he will be off Sprycel this time next year and are holding off on Prevnar 13 for now.  Consider Shingrix next year Immunization History  Administered Date(s) Administered  . PFIZER SARS-COV-2 Vaccination 11/23/2019, 12/19/2019  . Td 02/16/2000, 10/03/2008  . Tdap 03/14/2019   Health Maintenance  Topic Date Due  . Pneumonia vaccines (1 of 2 - PCV13) Never done  . Flu Shot  09/19/2022*  . HIV Screening  11/08/2109*  .  Hepatitis C: One time screening is recommended by Center for Disease Control  (CDC) for  adults born from [birth year] through 1966.   11/15/2109*  . Colon Cancer Screening  12/23/2020  . Tetanus Vaccine  03/13/2029  . COVID-19 Vaccine  Completed  *Topic was postponed. The date shown is not the original due date.   Screening tests-  1. Colon cancer screening- adenomatous polyp in 2017x2-recall 2022 2. Lung Cancer screening- never smoker so doesn't qualify 3. Skin cancer screening- does not see dermatology. Does have rash on his chest he wants me to evaluate- Dr. Burr Medico said not typical of sprycel 4. Prostate cancer screening-  Low  risk prior psa trend- defer rectal unless trend concerning Lab Results  Component Value Date   PSA 0.26 03/14/2019   PSA 0.13 03/01/2018   PSA 0.21 12/01/2016   The following were reviewed and entered/updated in epic: Past Medical History:  Diagnosis Date  . Atrial fibrillation ([place])   . Diverticulosis   . Hx of adenomatous colonic polyps   . Hyperlipidemia   . Hypertension   . Vitreous anomalies 2014   Tack down of vitreous tear   Patient Active Problem List    Diagnosis Date Noted  . CML (chronic myelocytic leukemia) ([place]) 03/04/2018    Priority: High  . CKD (chronic kidney disease), stage III 03/14/2019    Priority: Medium  . Hyperglycemia 12/08/2016    Priority: Medium  . NICM (nonischemic cardiomyopathy) ([place]) 04/02/2015    Priority: Medium  . Atrial fibrillation ([place]) 09/11/2014    Priority: Medium  . Hyperlipidemia 06/23/2007    Priority: Medium  . Hx of adenomatous colonic polyps     Priority: Low  . Obesity (BMI 30-39.9) 09/19/2013    Priority: Low  . H/O calcium pyrophosphate deposition disease (CPPD) 10/16/2009    Priority: Low   Past Surgical History:  Procedure Laterality Date  . CARDIAC CATHETERIZATION N/A 04/30/2015   Procedure: Left Heart Cath and Coronary Angiography;  Surgeon: Jerline Pain, MD;  Location: [place] [facility];  Service: Cardiovascular;  Laterality: N/A;  . CARDIOVERSION N/A 11/20/2014   Procedure: CARDIOVERSION;  Surgeon: Pixie Casino, MD;  Location: [facility]COPY;  Service: Cardiovascular;  Laterality: N/A;  11:38 Etomidate given IV for unsuccessful synched cardioversion @ 150 joules from Afib, 11:39 repeated synched cardioversion @ 200 joules successfully from Afib to SR. 12 lead EKG ordered to confirm..  . COLONOSCOPY    . COLONOSCOPY N/A 12/24/2015   Procedure: COLONOSCOPY ( ANAPHALAXIS ALLERGY TO EGGS);  Surgeon: Gatha Mayer, MD;  [facility];  Service: Endoscopy;  Laterality: N/A;  anaphalaxis allergy to eggs  . EYE SURGERY  2014    Family History  Problem Relation Age of Onset  . Asthma Mother   . Alzheimer's disease Mother        diagnosed 55  . Brain cancer Father        not genetic  . Other Brother        gilbert disease  . Heart attack Maternal Grandmother   . Heart attack Maternal Uncle   . Colon cancer Neg Hx   . Esophageal cancer Neg Hx   . Pancreatic cancer Neg Hx   . Liver disease Neg Hx     Medications- reviewed and updated Current Outpatient Medications    Medication Sig Dispense Refill  . atorvastatin (LIPITOR) 10 MG tablet TAKE 1 TABLET BY MOUTH EVERY OTHER DAY. 45 tablet 3  . dasatinib (SPRYCEL) 100 MG tablet TAKE 1 TABLET (100 MG TOTAL) BY MOUTH DAILY. MAY TAKE WITH FOOD TO DECREASE STOMACH IRRITATION. 30 tablet 2  . ELIQUIS 5 MG TABS tablet TAKE 1 TABLET BY MOUTH 2 TIMES DAILY. 60 tablet 11  . fluticasone (FLONASE) 50 MCG/ACT nasal spray INSTILL 2 SPRAYS INTO BOTH NOSTRILS DAILY 16 g 6  . furosemide (LASIX) 80 MG tablet TAKE 1 TABLET BY MOUTH DAILY. 90 tablet 3  . lisinopril (ZESTRIL) 5 MG tablet TAKE 1 TABLET BY MOUTH DAILY. PLEASE KEEP UPCOMING APPT FOR FUTURE REFILLS. 90 tablet 11  . metoprolol (TOPROL-XL) 200 MG 24 hr tablet TAKE 1/2 TAB IN THE MORNING AND 1  TAB AT NIGHT. 135 tablet 3   No current facility-administered medications for this visit.    Allergies-reviewed and updated Allergies  Allergen Reactions  . Eggs Or Egg-Derived Products Anaphylaxis    REACTION: no vaccines    Social History   Socioeconomic History  . Marital status: Married    Spouse name: Not on file  . Number of children: Not on file  . Years of education: Not on file  . Highest education level: Not on file  Occupational History  . Not on file  Tobacco Use  . Smoking status: Never Smoker  . Smokeless tobacco: Never Used  Substance and Sexual Activity  . Alcohol use: Yes    Alcohol/week: 0.0 standard drinks    Comment: 1 drink once a month max  . Drug use: No  . Sexual activity: Yes  Other Topics Concern  . Not on file  Social History Narrative   Married 1977. 2 children (son in doctorate program at [employer] nearing end and daughter with [employer] in [place]). No grandkids   [employer] [place]   [employer] for [employer] school.       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 3 dogs, 1 cat, 1 snake.      Hobbies: fishing on reservoirs in [place], family time, nature, hiking   Social  Determinants of Health   Financial Resource Strain:   . Difficulty of Paying Living Expenses:   Food Insecurity:   . Worried About Charity fundraiser in the Last Year:   . Arboriculturist in the Last Year:   Transportation Needs:   . Film/video editor (Medical):   Marland Kitchen Lack of Transportation (Non-Medical):   Physical Activity:   . Days of Exercise per Week:   . Minutes of Exercise per Session:   Stress:   . Feeling of Stress :   Social Connections:   . Frequency of Communication with Friends and Family:   . Frequency of Social Gatherings with Friends and Family:   . Attends Religious Services:   . Active Member of Clubs or Organizations:   . Attends Archivist Meetings:   Marland Kitchen Marital Status:    Objective  Objective:  BP 100/64   Pulse 61   Temp 98.1 F (36.7 C)   Ht 6' 4" (1.93 m)   Wt 291 lb (132 kg)   SpO2 98%   BMI 35.42 kg/m  Gen: NAD, resting comfortably HEENT: Mask not removed due to covid 19. TM normal. Bridge of nose normal. Eyelids normal.  Neck: no thyromegaly or cervical lymphadenopathy  CV: RRR no murmurs rubs or gallops Lungs: CTAB no crackles, wheeze, rhonchi Abdomen: soft/nontender/nondistended/normal bowel sounds. No rebound or guarding.  Ext: no edema Skin: warm, dry Neuro: grossly normal, moves all extremities, PERRLA   Assessment and Plan:   Welcome to Medicare exam completed-  1. Educated, counseled and referred based on above elements 2. Educated, counseled and referred as appropriate for preventative needs 3. Discussed and documented a written plan for preventiative services and screenings with personalized health advice- After Visit Summary was given to patient which included this plan  4. EKG offered [id number]- patient declines- just had with cardiology  Status of chronic or acute concerns   #CML-continues close follow-up with Dr. Burr Medico since may 2019- remains on sprycel since June 2020. Plan is to come off if remains MMR for 2  years. Retreat if recurs  A/P: CML appears to be doing well-continue close follow-up with oncology  Rash on Chest S: pt c/o rash on chest since Oct that comes and goes. He denies pain and itching (other than mild) and has not put anything on it. Dr. Burr Medico per patient said not typical of sprycel. Only thing that seems to help is leaving it alone.   ROS-not ill appearing, no fever/chills. No new medications- started months after sprycel. is immunocompromised. No mucus membrane involvement.  A/P: Unclear etiology of rash-no obvious irritant in this area on left upper chest.  We are going to try triamcinolone to see if this helps.  If it does not improve within 2 weeks consider referral to dermatology  #hyperlipidemia S: Medication:Atorvastatin 10Mg  Lab Results  Component Value Date   CHOL 123 03/14/2019   HDL 39.60 03/14/2019   LDLCALC 60 03/14/2019   TRIG 115.0 03/14/2019   CHOLHDL 3 03/14/2019   A/P: Excellent control last check-update with labs today  # Atrial fibrillation/ nonischemic cardiomyopathy with EF 25-30% in 2016- most recent echo 02/2018 with normal EF- remains on beta blocker, lasix, ace-i S: Rate controlled with metoprolol 200 mg extended release Anticoagulated with Eliquis 5 mg Patient is  followed by cardiology: Dr. Lovena Le  A/P: Excellent control of both of these issues-continue current medication  # Hyperglycemia/insulin resistance/prediabetes-peak A1c 6.7 only x1 S:  Medication: none Exercise and diet-  Weight up a few lbs but plans to increase exercise and decrease portion size Lab Results  Component Value Date   HGBA1C 6.1 03/14/2019   HGBA1C 6.7 (H) 03/01/2018   A/P: Hopefully A1c remains well controlled-continue to work on diet and exercise-if he did have another number of 6.5 or above would have to diagnose diabetes  #hypertension S: medication: lisinopril 51m, Metoprolol 200Mg, Lasix 80Mg Home readings #s: does not check BP Readings from Last 3 Encounters:   03/19/20 100/64  02/06/20 118/63  12/05/19 100/70  A/P: Stable. Continue current medications. Asymptomatic even with BP this low- needs above meds for NICM though  #Chronic kidney disease stage III S: GFR is typically in the 50s range -Patient knows to avoid NSAIDs  A/P: hopefully stable- continue current meds   Recommended follow up: prefers 1 year awv and physical  Future Appointments  Date Time [place] 04/30/2020  8:00 AM MHayden Pedro MD [facility] None  05/07/2020  8:00 AM [facility] None  05/07/2020  8:40 AM [facility]ruitt Merle [facility]     Lab/Order associations:   ICD-10-CM   1. Preventative health care  Z00.00    Return precautions advised. SGarret Reddish MD

## 2020-03-11 NOTE — Patient Instructions (Addendum)
Health Maintenance Due  Topic Date Due  . PNA vac Low Risk Adult (1 of 2 - PCV13)-  will do today  Never done   Persistent atrial fibrillation [facility]): continue follow ups with cardiology and medications.   Cholesterol: continue the atorvastatin    Pre Diabetes: continue exercise and healthy eating. We will continue to monitor.  If you get an A1c of 6.5 or higher you will be considered diabetic.    Rash: we are sending in a cream for you to try. If  No improvement in 2 weeks let our office know. If you develop any fever, increase in symptoms let our office know. May consider a referral to dermatology if no improvement.   Kidney function: due to kidney function avoid NSAID.     Please stop by lab before you go If you have mychart- we will send your results within 3 business days of [place] receiving them.  If you do not have mychart- we will call you about results within 5 business days of [place] receiving them.    [name] , Thank you for taking time to come for your Medicare Wellness Visit. I appreciate your ongoing commitment to your health goals. Please review the following plan we discussed and let me know if I can assist you in the future.   These are the goals we discussed: Goals   Continue your exercise. Work on portion control. You will be due for colonoscopy next year. Continue with regular eye exams and dental visits.      This is a list of the screening recommended for you and due dates:  Health Maintenance  Topic Date Due  . Pneumonia vaccines (1 of 2 - PCV13) Never done  . Flu Shot  09/19/2022*  . HIV Screening  11/08/2109*  .  Hepatitis C: One time screening is recommended by Center for Disease Control  (CDC) for  adults born from [birth year] through 1966.   11/15/2109*  . Colon Cancer Screening  12/23/2020  . Tetanus Vaccine  03/13/2029  . COVID-19 Vaccine  Completed  *Topic was postponed. The date shown is not the original due date.

## 2020-03-19 ENCOUNTER — Other Ambulatory Visit: Payer: Self-pay

## 2020-03-19 ENCOUNTER — Other Ambulatory Visit: Payer: Self-pay | Admitting: Hematology

## 2020-03-19 ENCOUNTER — Ambulatory Visit (INDEPENDENT_AMBULATORY_CARE_PROVIDER_SITE_OTHER): Payer: PPO | Admitting: Family Medicine

## 2020-03-19 ENCOUNTER — Encounter: Payer: Self-pay | Admitting: Family Medicine

## 2020-03-19 VITALS — BP 100/64 | HR 61 | Temp 98.1°F | Ht 76.0 in | Wt 291.0 lb

## 2020-03-19 DIAGNOSIS — C9210 Chronic myeloid leukemia, BCR/ABL-positive, not having achieved remission: Secondary | ICD-10-CM

## 2020-03-19 DIAGNOSIS — Z125 Encounter for screening for malignant neoplasm of prostate: Secondary | ICD-10-CM

## 2020-03-19 DIAGNOSIS — I4819 Other persistent atrial fibrillation: Secondary | ICD-10-CM | POA: Diagnosis not present

## 2020-03-19 DIAGNOSIS — Z0000 Encounter for general adult medical examination without abnormal findings: Secondary | ICD-10-CM | POA: Diagnosis not present

## 2020-03-19 DIAGNOSIS — Z23 Encounter for immunization: Secondary | ICD-10-CM | POA: Diagnosis not present

## 2020-03-19 DIAGNOSIS — E785 Hyperlipidemia, unspecified: Secondary | ICD-10-CM

## 2020-03-19 DIAGNOSIS — R739 Hyperglycemia, unspecified: Secondary | ICD-10-CM

## 2020-03-19 LAB — CBC WITH DIFFERENTIAL/PLATELET
Basophils Absolute: 0 10*3/uL (ref 0.0–0.1)
Basophils Relative: 0.4 % (ref 0.0–3.0)
Eosinophils Absolute: 0.1 10*3/uL (ref 0.0–0.7)
Eosinophils Relative: 2.1 % (ref 0.0–5.0)
HCT: 40.5 % (ref 39.0–52.0)
Hemoglobin: 14 g/dL (ref 13.0–17.0)
Lymphocytes Relative: 20.5 % (ref 12.0–46.0)
Lymphs Abs: 1.2 10*3/uL (ref 0.7–4.0)
MCHC: 34.4 g/dL (ref 30.0–36.0)
MCV: 96.5 fl (ref 78.0–100.0)
Monocytes Absolute: 0.6 10*3/uL (ref 0.1–1.0)
Monocytes Relative: 10.7 % (ref 3.0–12.0)
Neutro Abs: 3.8 10*3/uL (ref 1.4–7.7)
Neutrophils Relative %: 66.3 % (ref 43.0–77.0)
Platelets: 173 10*3/uL (ref 150.0–400.0)
RBC: 4.2 Mil/uL — ABNORMAL LOW (ref 4.22–5.81)
RDW: 13.8 % (ref 11.5–15.5)
WBC: 5.7 10*3/uL (ref 4.0–10.5)

## 2020-03-19 LAB — COMPREHENSIVE METABOLIC PANEL
ALT: 19 U/L (ref 0–53)
AST: 20 U/L (ref 0–37)
Albumin: 4.4 g/dL (ref 3.5–5.2)
Alkaline Phosphatase: 46 U/L (ref 39–117)
BUN: 24 mg/dL — ABNORMAL HIGH (ref 6–23)
CO2: 30 mEq/L (ref 19–32)
Calcium: 9.3 mg/dL (ref 8.4–10.5)
Chloride: 103 mEq/L (ref 96–112)
Creatinine, Ser: 1.21 mg/dL (ref 0.40–1.50)
GFR: 60.11 mL/min (ref >60.00)
Glucose, Bld: 97 mg/dL (ref 70–99)
Potassium: 4.9 mEq/L (ref 3.5–5.1)
Sodium: 138 mEq/L (ref 135–145)
Total Bilirubin: 0.9 mg/dL (ref 0.2–1.2)
Total Protein: 6.5 g/dL (ref 6.0–8.3)

## 2020-03-19 LAB — LIPID PANEL
Cholesterol: 164 mg/dL (ref 0–200)
HDL: 44.5 mg/dL (ref >39.00)
LDL Cholesterol: 89 mg/dL (ref 0–99)
NonHDL: 119.01
Total CHOL/HDL Ratio: 4
Triglycerides: 152 mg/dL — ABNORMAL HIGH (ref 0.0–149.0)
VLDL: 30.4 mg/dL (ref 0.0–40.0)

## 2020-03-19 LAB — PSA: PSA: 0.12 ng/mL (ref 0.10–4.00)

## 2020-03-19 LAB — HEMOGLOBIN A1C: Hgb A1c MFr Bld: 6.1 % (ref 4.6–6.5)

## 2020-03-19 MED ORDER — TRIAMCINOLONE ACETONIDE 0.1 % EX CREA
TOPICAL_CREAM | CUTANEOUS | 1 refills | Status: AC
Start: 2020-03-19 — End: ?

## 2020-03-19 MED FILL — ELIQUIS 5 MG TABLET: 5 | 30 days supply | Qty: 60 | Fill #3

## 2020-03-19 MED FILL — TRIAMCINOLONE ACETONIDE 0.1: 0.1 | 10 days supply | Qty: 45 | Fill #0

## 2020-03-19 NOTE — Progress Notes (Signed)
Phone: [phone]   Subjective:  Patient presents today for their annual physical. Chief complaint-noted.   See problem oriented charting- ROS- full  review of systems was completed and negative  except for: knee pain, nocturia x1, edema at end of day, loose stools on sprycel  The following were reviewed and entered/updated in epic: Past Medical History:  Diagnosis Date   Atrial fibrillation ([place])    Diverticulosis    Hx of adenomatous colonic polyps    Hyperlipidemia    Hypertension    Vitreous anomalies 2014   Tack down of vitreous tear   Patient Active Problem List   Diagnosis Date Noted   CML (chronic myelocytic leukemia) ([place]) 03/04/2018    Priority: High   CKD (chronic kidney disease), stage III 03/14/2019    Priority: Medium   Hyperglycemia 12/08/2016    Priority: Medium   NICM (nonischemic cardiomyopathy) ([place]) 04/02/2015    Priority: Medium   Atrial fibrillation ([place]) 09/11/2014    Priority: Medium   Hyperlipidemia 06/23/2007    Priority: Medium   Hx of adenomatous colonic polyps     Priority: Low   Obesity (BMI 30-39.9) 09/19/2013    Priority: Low   H/O calcium pyrophosphate deposition disease (CPPD) 10/16/2009    Priority: Low   Past Surgical History:  Procedure Laterality Date   CARDIAC CATHETERIZATION N/A 04/30/2015   Procedure: Left Heart Cath and Coronary Angiography;  Surgeon: Jerline Pain, MD;  Location: [facility];  Service: Cardiovascular;  Laterality: N/A;   CARDIOVERSION N/A 11/20/2014   Procedure: CARDIOVERSION;  Surgeon: Pixie Casino, MD;  Location: [facility]OSCOPY;  Service: Cardiovascular;  Laterality: N/A;  11:38 Etomidate given IV for unsuccessful synched cardioversion @ 150 joules from Afib, 11:39 repeated synched cardioversion @ 200 joules successfully from Afib to SR. 12 lead EKG ordered to confirm..   COLONOSCOPY     COLONOSCOPY N/A 12/24/2015   Procedure: COLONOSCOPY ( ANAPHALAXIS ALLERGY TO EGGS);  Surgeon:  Gatha Mayer, MD;  Location: [facility];  Service: Endoscopy;  Laterality: N/A;  anaphalaxis allergy to eggs   EYE SURGERY  2014    Family History  Problem Relation Age of Onset   Asthma Mother    Alzheimer's disease Mother        diagnosed 13   Brain cancer Father        not genetic   Other Brother        gilbert disease   Heart attack Maternal Grandmother    Heart attack Maternal Uncle    Colon cancer Neg Hx    Esophageal cancer Neg Hx    Pancreatic cancer Neg Hx    Liver disease Neg Hx     Medications- reviewed and updated Current Outpatient Medications  Medication Sig Dispense Refill   atorvastatin (LIPITOR) 10 MG tablet TAKE 1 TABLET BY MOUTH EVERY OTHER DAY. 45 tablet 3   dasatinib (SPRYCEL) 100 MG tablet TAKE 1 TABLET (100 MG TOTAL) BY MOUTH DAILY. MAY TAKE WITH FOOD TO DECREASE STOMACH IRRITATION. 30 tablet 2   ELIQUIS 5 MG TABS tablet TAKE 1 TABLET BY MOUTH 2 TIMES DAILY. 60 tablet 11   fluticasone (FLONASE) 50 MCG/ACT nasal spray INSTILL 2 SPRAYS INTO BOTH NOSTRILS DAILY 16 g 6   furosemide (LASIX) 80 MG tablet TAKE 1 TABLET BY MOUTH DAILY. 90 tablet 3   lisinopril (ZESTRIL) 5 MG tablet TAKE 1 TABLET BY MOUTH DAILY. PLEASE KEEP UPCOMING APPT FOR FUTURE REFILLS. 90 tablet 11   metoprolol (  TOPROL-XL) 200 MG 24 hr tablet TAKE 1/2 TAB IN THE MORNING AND 1 TAB AT NIGHT. 135 tablet 3   triamcinolone cream (KENALOG) 0.1 % Apply twice a day. For  7-10 days maximum 45 g 1   No current facility-administered medications for this visit.    Allergies-reviewed and updated Allergies  Allergen Reactions   Eggs Or Egg-Derived Products Anaphylaxis    REACTION: no vaccines    Social History   Social History Narrative   Married 1977. 2 children (son in doctorate program at [employer] [employer] nearing end and daughter with [employer] in [place]). No grandkids   [employer] [place]   [employer] for [employer].       Veterinarian in town. [facility]  (been there for 5 years, [place] 27 years before that). 3 dogs, 1 cat, 1 snake.      Hobbies: fishing on reservoirs in [place], family time, nature, hiking   Objective  Objective:  BP 100/64    Pulse 61    Temp 98.1 F (36.7 C)    Ht 6\' 4"  (1.93 m)    Wt 291 lb (132 kg)    SpO2 98%    BMI 35.42 kg/m  Gen: NAD, resting comfortably HEENT: Mask not removed due to covid 19. TM normal. Bridge of nose normal. Eyelids normal.  Neck: no thyromegaly or cervical lymphadenopathy  CV: RRR no murmurs rubs or gallops Lungs: CTAB no crackles, wheeze, rhonchi Abdomen: soft/nontender/nondistended/normal bowel sounds. No rebound or guarding.  Ext: no edema Skin: warm, dry Neuro: grossly normal, moves all extremities, PERRLA    Assessment and Plan  64 y.o. male presenting for annual physical.  Health Maintenance counseling: 1. Anticipatory guidance: Patient counseled regarding regular dental exams -q6 months, eye exams - at least yearly,  avoiding smoking and second hand smoke , limiting alcohol to 2 beverages per day - no alcohol last year.   2. Risk factor reduction:  Advised patient of need for regular exercise and diet rich and fruits and vegetables to reduce risk of heart attack and stroke. Exercise- 2 days a week- encouraged increase. Diet-reasonably healthy but can work on portion size.  Technically morbidly obese with BMI over 35 with hypertension and hyperlipidemia Wt Readings from Last 3 Encounters:  03/19/20 291 lb (132 kg)  02/06/20 293 lb 8 oz (133.1 kg)  12/05/19 292 lb 6.4 oz (132.6 kg)   3. Immunizations/screenings/ancillary studies-pneumovax 23 today. shingrix next year. Hold off on prevnar unless on sprycel long term Immunization History  Administered Date(s) Administered   PFIZER SARS-COV-2 Vaccination 11/23/2019, 12/19/2019   Td 02/16/2000, 10/03/2008   Tdap 03/14/2019  4. Prostate cancer screening- low risk prior psa trend, update with labts today  Lab Results  Component Value Date    PSA 0.26 03/14/2019   PSA 0.13 03/01/2018   PSA 0.21 12/01/2016   5. Colon cancer screening - due 2022, adenoma x2 in 2017 6. Skin cancer screening- advised regular sunscreen use. Denies worrisome, changing, or new skin lesions- rash on chest treating triamcinolone 7. Never smoker 8. STD screening - monogomous  Status of chronic or acute concerns  See welcome to medicare form  Recommended follow up: 1 year physical and awv Future Appointments  Date Time Provider [place]  04/30/2020  8:00 AM Hayden Pedro, MD [facility] None  05/07/2020  8:00 AM [facility] 6 [facility] None  05/07/2020  8:40 AM Truitt Merle, MD [facility] None  03/25/2021  9:20 AM Yong Channel Brayton Mars, MD  [facility]   Lab/Order associations: fasting   ICD-10-CM   1. Preventative health care  Z00.00   2. Persistent atrial fibrillation (HCC)  I48.19   3. Hyperlipidemia, unspecified hyperlipidemia type  E78.5 CBC with Differential/Platelet    Comprehensive metabolic panel    Lipid panel  4. Hyperglycemia  R73.9 Hemoglobin A1c  5. Screening for prostate cancer  Z12.5 PSA  6. Need for pneumococcal vaccination  Z23 Pneumococcal polysaccharide vaccine 23-valent greater than or equal to 2yo subcutaneous/IM  7. Morbid obesity ([place])  E66.01     Return precautions advised.  Garret Reddish, MD

## 2020-03-21 ENCOUNTER — Telehealth: Payer: Self-pay

## 2020-03-21 NOTE — Telephone Encounter (Signed)
Patient returning call to JoEllen in regards to results. Would like a call back to discuss. CB#: [phone]

## 2020-03-31 LAB — BCR/ABL

## 2020-04-02 DIAGNOSIS — H43811 Vitreous degeneration, right eye: Secondary | ICD-10-CM | POA: Diagnosis not present

## 2020-04-02 DIAGNOSIS — H2513 Age-related nuclear cataract, bilateral: Secondary | ICD-10-CM | POA: Diagnosis not present

## 2020-04-02 DIAGNOSIS — H25013 Cortical age-related cataract, bilateral: Secondary | ICD-10-CM | POA: Diagnosis not present

## 2020-04-02 DIAGNOSIS — H40023 Open angle with borderline findings, high risk, bilateral: Secondary | ICD-10-CM | POA: Diagnosis not present

## 2020-04-02 LAB — HM DIABETES EYE EXAM

## 2020-04-02 MED FILL — METOPROLOL SUCCINATE ER 200: 200 | 90 days supply | Qty: 135 | Fill #1

## 2020-04-16 MED FILL — FLUTICASONE PROP 50 MCG SPR: 50 | 30 days supply | Qty: 16 | Fill #3

## 2020-04-16 MED FILL — ELIQUIS 5 MG TABLET: 5 | 30 days supply | Qty: 60 | Fill #4

## 2020-04-21 MED FILL — SPRYCEL 100 MG TABLET: 100 | 30 days supply | Qty: 30 | Fill #1

## 2020-04-30 ENCOUNTER — Encounter (INDEPENDENT_AMBULATORY_CARE_PROVIDER_SITE_OTHER): Payer: 59 | Admitting: Ophthalmology

## 2020-04-30 ENCOUNTER — Other Ambulatory Visit: Payer: Self-pay

## 2020-04-30 DIAGNOSIS — D3132 Benign neoplasm of left choroid: Secondary | ICD-10-CM | POA: Diagnosis not present

## 2020-04-30 DIAGNOSIS — H353132 Nonexudative age-related macular degeneration, bilateral, intermediate dry stage: Secondary | ICD-10-CM | POA: Diagnosis not present

## 2020-04-30 DIAGNOSIS — H33301 Unspecified retinal break, right eye: Secondary | ICD-10-CM | POA: Diagnosis not present

## 2020-04-30 DIAGNOSIS — H43813 Vitreous degeneration, bilateral: Secondary | ICD-10-CM

## 2020-04-30 DIAGNOSIS — I10 Essential (primary) hypertension: Secondary | ICD-10-CM | POA: Diagnosis not present

## 2020-04-30 DIAGNOSIS — H35033 Hypertensive retinopathy, bilateral: Secondary | ICD-10-CM | POA: Diagnosis not present

## 2020-05-02 NOTE — Progress Notes (Signed)
[facility]   [phone] [phone]   Clinic Follow up Note   Patient Care Team: Marin Olp, MD as PCP - General (Family Medicine) Evans Lance, MD as Consulting Physician (Cardiology) Hayden Pedro, MD as Consulting Physician (Ophthalmology) Hortencia Pilar, MD (Optometry)  Date of Service:  05/07/2020  CHIEF COMPLAINT: F/u for CML, Chronic phase  RESPONSE EVALUATION:  Complete hematological response: <1 month MMR:yes, 2 months after starting Sprycel  Bcr/abl IS 03/10/18: 41.28% 06/07/18: 16.96% 09/06/18: 3.78% 12/06/18: 1.7953%  03/07/19:1.27% 05/09/2019: 0.0999% 08/08/19: 0.0287% 11/07/19: MMR, not detectable  02/06/20: 0.0149%  CURRENT THERAPY: StartedGleevec 422m daily on 03/15/18, switched to Sprycel on 03/29/2019  INTERVAL HISTORY:  [place] here for a follow up of CML. He presents to the clinic alone. He notes he is doing well. He is tolerating Sprycel well with occasional diarrhea. I reviewed his medication list with him. He uses Lasix every morning. His BP is normal. He note she is $750 out of pocket per month for treatment. He is currently on Medicare. He was previously getting treatment covered but not since being on Medicare.     REVIEW OF SYSTEMS:   Constitutional: Denies fevers, chills or abnormal weight loss Eyes: Denies blurriness of vision Ears, nose, mouth, throat, and face: Denies mucositis or sore throat Respiratory: Denies cough, dyspnea or wheezes Cardiovascular: Denies palpitation, chest discomfort or lower extremity swelling Gastrointestinal:  Denies nausea, heartburn or change in bowel habits Skin: Denies abnormal skin rashes Lymphatics: Denies new lymphadenopathy or easy bruising Neurological:Denies numbness, tingling or new weaknesses Behavioral/Psych: Mood is stable, no new changes  All other systems were reviewed with the patient and are negative.  MEDICAL HISTORY:    Past Medical History:  Diagnosis Date  . Atrial fibrillation ([place]   . Diverticulosis   . Hx of adenomatous colonic polyps   . Hyperlipidemia   . Hypertension   . Vitreous anomalies 2014   Tack down of vitreous tear    SURGICAL HISTORY: Past Surgical History:  Procedure Laterality Date  . CARDIAC CATHETERIZATION N/A 04/30/2015   Procedure: Left Heart Cath and Coronary Angiography;  Surgeon: MJerline Pain MD;  [facility];  Service: Cardiovascular;  Laterality: N/A;  . CARDIOVERSION N/A 11/20/2014   Procedure: CARDIOVERSION;  Surgeon: KPixie Casino MD;  [facility];  Service: Cardiovascular;  Laterality: N/A;  11:38 Etomidate given IV for unsuccessful synched cardioversion @ 150 joules from Afib, 11:39 repeated synched cardioversion @ 200 joules successfully from Afib to SR. 12 lead EKG ordered to confirm..  . COLONOSCOPY    . COLONOSCOPY N/A 12/24/2015   Procedure: COLONOSCOPY ( ANAPHALAXIS ALLERGY TO EGGS);  Surgeon: CGatha Mayer MD;  [facility];  Service: Endoscopy;  Laterality: N/A;  anaphalaxis allergy to eggs  . EYE SURGERY  2014    I have reviewed the social history and family history with the patient and they are unchanged from previous note.  ALLERGIES:  is allergic to eggs or egg-derived products.  MEDICATIONS:  Current Outpatient Medications  Medication Sig Dispense Refill  . atorvastatin (LIPITOR) 10 MG tablet TAKE 1 TABLET BY MOUTH EVERY OTHER DAY. 45 tablet 3  . dasatinib (SPRYCEL) 100 MG tablet TAKE 1 TABLET (100 MG TOTAL) BY MOUTH DAILY. MAY TAKE WITH FOOD TO DECREASE STOMACH IRRITATION. 30 tablet 2  . ELIQUIS 5 MG TABS tablet TAKE 1 TABLET BY MOUTH 2 TIMES DAILY. 60 tablet 11  . fluticasone (  FLONASE) 50 MCG/ACT nasal spray INSTILL 2 SPRAYS INTO BOTH NOSTRILS DAILY 16 g 6  . furosemide (LASIX) 80 MG tablet TAKE 1 TABLET BY MOUTH DAILY. 90 tablet 3  . lisinopril (ZESTRIL) 5 MG tablet TAKE 1 TABLET BY MOUTH DAILY. PLEASE  KEEP UPCOMING APPT FOR FUTURE REFILLS. 90 tablet 11  . metoprolol (TOPROL-XL) 200 MG 24 hr tablet TAKE 1/2 TAB IN THE MORNING AND 1 TAB AT NIGHT. 135 tablet 3  . triamcinolone cream (KENALOG) 0.1 % Apply twice a day. For  7-10 days maximum 45 g 1   No current facility-administered medications for this visit.    PHYSICAL EXAMINATION: ECOG PERFORMANCE STATUS: 0 - Asymptomatic  Vitals:   05/07/20 0813  BP: 124/61  Pulse: 86  Resp: (!) 24  Temp: 98.1 F (36.7 C)  SpO2: 97%   Filed Weights   05/07/20 0813  Weight: 293 lb 12.8 oz (133.3 kg)    GENERAL:alert, no distress and comfortable SKIN: skin color, texture, turgor are normal, no rashes or significant lesions EYES: normal, Conjunctiva are pink and non-injected, sclera clear  NECK: supple, thyroid normal size, non-tender, without nodularity LYMPH:  no palpable lymphadenopathy in the cervical, axillary  LUNGS: clear to auscultation and percussion with normal breathing effort HEART: regular rate & rhythm and no murmurs (+) Pitting lower extremity edema ABDOMEN:abdomen soft, non-tender and normal bowel sounds Musculoskeletal:no cyanosis of digits and no clubbing  NEURO: alert & oriented x 3 with fluent speech, no focal motor/sensory deficits  LABORATORY DATA:  I have reviewed the data as listed CBC Latest Ref Rng & Units 05/07/2020 03/19/2020 02/06/2020  WBC 4.0 - 10.5 K/uL 5.0 5.7 4.5  Hemoglobin 13.0 - 17.0 g/dL 13.5 14.0 13.3  Hematocrit 39 - 52 % 40.6 40.5 40.3  Platelets 150 - 400 K/uL 177 173.0 157     CMP Latest Ref Rng & Units 05/07/2020 03/19/2020 02/06/2020  Glucose 70 - 99 mg/dL 98 97 99  BUN 8 - 23 mg/dL 23 24(H) 27(H)  Creatinine 0.61 - 1.24 mg/dL 1.40(H) 1.21 1.38(H)  Sodium 135 - 145 mmol/L 140 138 141  Potassium 3.5 - 5.1 mmol/L 4.6 4.9 5.1  Chloride 98 - 111 mmol/L 103 103 106  CO2 22 - 32 mmol/L '29 30 29  ' Calcium 8.9 - 10.3 mg/dL 9.8 9.3 9.5  Total Protein 6.5 - 8.1 g/dL 7.2 6.5 6.9  Total Bilirubin 0.3 -  1.2 mg/dL 0.8 0.9 0.8  Alkaline Phos 38 - 126 U/L 59 46 57  AST 15 - 41 U/L '21 20 23  ' ALT 0 - 44 U/L '21 19 23      ' RADIOGRAPHIC STUDIES: I have personally reviewed the radiological images as listed and agreed with the findings in the report. No results found.   ASSESSMENT & PLAN:  Mahmood Case Cass is a 64 y.o. male with    1. Chronic Myelogenous Leukocytosis, Chronic phase, in MMR -He was diagnosed in May 2019.  -Hewasstarted [place] at the end of May 2019, tolerated very well. -He hadachieved complete hematological response within the first month, but did not havecomplete molecular remissionin 1 year, mutation test was negative, so he was switched to Sprycel in June 2020. He has had rapid response and achieved MMRin July 2020. -He is clinically doing well and stable. He continues to tolerate Sprycel well with occasional diarrhea. Physical exam unremarkable. Labs reviewed, CBC and CMP WNL Cr 1.40. bcr/abl still pending. He continues to be in MMR.  -Continue Sprycel. I discussed if  he is able to remain inMMRfor 2 years we can stop Sprycel and watch.We discussed the data on that.If recurs we can retreat. He understands.  -F/u in 4 months    2. Atrial Fibrillation and ischemic congestive heart failure with EF 25-30% 02/2015 -On treatment with Eliquis, Lipitor, Lasix, lisinopril, metoprolol and Verapamil.  -Followed by Cardiologist Dr. Janit Pagan and PCP -LastECHO from 03/14/18 shows his EF has improved to 55-60% -Doing well medically, asymptomaticwith recent dry cough at night.  3. LE edema -Secondary to CHF and leukocytosis -Much improved withLasix 44m dailyand CML treatment, continue -He has trace leg edema    PLAN: -Lab reviewed, will continue Sprycel, he is doing well  -Lab and follow-up in 4 months -[place] message with today's BCR/ABL result when It returns     No problem-specific Assessment & Plan notes found for this encounter.   No  orders of the defined types were placed in this encounter.  All questions were answered. The patient knows to call the clinic with any problems, questions or concerns. No barriers to learning was detected.      YTruitt Merle MD 05/07/2020   I, AJoslyn Devon am acting as scribe for YTruitt Merle MD.   I have reviewed the above documentation for accuracy and completeness, and I agree with the above.

## 2020-05-07 ENCOUNTER — Inpatient Hospital Stay: Payer: PPO | Attending: Hematology | Admitting: Hematology

## 2020-05-07 ENCOUNTER — Other Ambulatory Visit: Payer: Self-pay

## 2020-05-07 ENCOUNTER — Encounter: Payer: Self-pay | Admitting: Hematology

## 2020-05-07 ENCOUNTER — Inpatient Hospital Stay: Payer: PPO

## 2020-05-07 ENCOUNTER — Telehealth: Payer: Self-pay | Admitting: Hematology

## 2020-05-07 VITALS — BP 124/61 | HR 86 | Temp 98.1°F | Resp 24 | Wt 293.8 lb

## 2020-05-07 DIAGNOSIS — I110 Hypertensive heart disease with heart failure: Secondary | ICD-10-CM | POA: Insufficient documentation

## 2020-05-07 DIAGNOSIS — I4891 Unspecified atrial fibrillation: Secondary | ICD-10-CM | POA: Insufficient documentation

## 2020-05-07 DIAGNOSIS — E785 Hyperlipidemia, unspecified: Secondary | ICD-10-CM | POA: Diagnosis not present

## 2020-05-07 DIAGNOSIS — Z79899 Other long term (current) drug therapy: Secondary | ICD-10-CM | POA: Insufficient documentation

## 2020-05-07 DIAGNOSIS — C9210 Chronic myeloid leukemia, BCR/ABL-positive, not having achieved remission: Secondary | ICD-10-CM

## 2020-05-07 DIAGNOSIS — Z7901 Long term (current) use of anticoagulants: Secondary | ICD-10-CM | POA: Diagnosis not present

## 2020-05-07 DIAGNOSIS — I509 Heart failure, unspecified: Secondary | ICD-10-CM | POA: Diagnosis not present

## 2020-05-07 DIAGNOSIS — Z86010 Personal history of colonic polyps: Secondary | ICD-10-CM | POA: Diagnosis not present

## 2020-05-07 DIAGNOSIS — R197 Diarrhea, unspecified: Secondary | ICD-10-CM | POA: Diagnosis not present

## 2020-05-07 DIAGNOSIS — R609 Edema, unspecified: Secondary | ICD-10-CM | POA: Insufficient documentation

## 2020-05-07 LAB — CBC WITH DIFFERENTIAL (CANCER CENTER ONLY)
Abs Immature Granulocytes: 0.01 10*3/uL (ref 0.00–0.07)
Basophils Absolute: 0 10*3/uL (ref 0.0–0.1)
Basophils Relative: 1 %
Eosinophils Absolute: 0 10*3/uL (ref 0.0–0.5)
Eosinophils Relative: 0 %
HCT: 40.6 % (ref 39.0–52.0)
Hemoglobin: 13.5 g/dL (ref 13.0–17.0)
Immature Granulocytes: 0 %
Lymphocytes Relative: 20 %
Lymphs Abs: 1 10*3/uL (ref 0.7–4.0)
MCH: 32.7 pg (ref 26.0–34.0)
MCHC: 33.3 g/dL (ref 30.0–36.0)
MCV: 98.3 fL (ref 80.0–100.0)
Monocytes Absolute: 0.7 10*3/uL (ref 0.1–1.0)
Monocytes Relative: 13 %
Neutro Abs: 3.3 10*3/uL (ref 1.7–7.7)
Neutrophils Relative %: 66 %
Platelet Count: 177 10*3/uL (ref 150–400)
RBC: 4.13 MIL/uL — ABNORMAL LOW (ref 4.22–5.81)
RDW: 13.8 % (ref 11.5–15.5)
WBC Count: 5 10*3/uL (ref 4.0–10.5)
nRBC: 0 % (ref 0.0–0.2)

## 2020-05-07 LAB — CMP (CANCER CENTER ONLY)
ALT: 21 U/L (ref 0–44)
AST: 21 U/L (ref 15–41)
Albumin: 4.1 g/dL (ref 3.5–5.0)
Alkaline Phosphatase: 59 U/L (ref 38–126)
Anion gap: 8 (ref 5–15)
BUN: 23 mg/dL (ref 8–23)
CO2: 29 mmol/L (ref 22–32)
Calcium: 9.8 mg/dL (ref 8.9–10.3)
Chloride: 103 mmol/L (ref 98–111)
Creatinine: 1.4 mg/dL — ABNORMAL HIGH (ref 0.61–1.24)
GFR, Est AFR Am: >60 mL/min (ref >60)
GFR, Estimated: 52 mL/min — ABNORMAL LOW (ref >60)
Glucose, Bld: 98 mg/dL (ref 70–99)
Potassium: 4.6 mmol/L (ref 3.5–5.1)
Sodium: 140 mmol/L (ref 135–145)
Total Bilirubin: 0.8 mg/dL (ref 0.3–1.2)
Total Protein: 7.2 g/dL (ref 6.5–8.1)

## 2020-05-07 NOTE — Telephone Encounter (Signed)
Scheduled per los. Gave avs and calendar  

## 2020-05-07 NOTE — Progress Notes (Signed)
Met with patient in lobby area whom was referred by physician. Introduced myself as Arboriculturist and to offer available resources.  Discussed one-time $1000 Radio broadcast assistant to assist with personal household expenses. Based on verbal guidelines, patient is over the income and does not qualify.  Advised once I review his treatment plan, if there is anything available he may apply for drugs.diagnosis, I will reach out to him to advise. He verbalized understanding.  Gave him my card for any other financial questions or concerns.

## 2020-05-14 MED FILL — LISINOPRIL 5 MG TABLET: 5 | 90 days supply | Qty: 90 | Fill #1

## 2020-05-15 LAB — BCR/ABL

## 2020-05-20 MED FILL — FLUTICASONE PROP 50 MCG SPR: 50 | 30 days supply | Qty: 16 | Fill #4

## 2020-05-20 MED FILL — ELIQUIS 5 MG TABLET: 5 | 30 days supply | Qty: 60 | Fill #5

## 2020-05-20 MED FILL — SPRYCEL 100 MG TABLET: 100 | 30 days supply | Qty: 30 | Fill #2

## 2020-06-11 ENCOUNTER — Other Ambulatory Visit: Payer: Self-pay | Admitting: Hematology

## 2020-06-11 DIAGNOSIS — C9210 Chronic myeloid leukemia, BCR/ABL-positive, not having achieved remission: Secondary | ICD-10-CM

## 2020-06-11 MED FILL — ATORVASTATIN CALCIUM 10 MG: 10 | 90 days supply | Qty: 45 | Fill #1

## 2020-06-13 ENCOUNTER — Other Ambulatory Visit: Payer: Self-pay | Admitting: Hematology

## 2020-06-13 NOTE — Progress Notes (Signed)
Phone [phone] In person visit   Subjective:   Debbie Case Sanguinetti is a 64 y.o. year old very pleasant male patient who presents for/with See problem oriented charting Chief Complaint  Patient presents with  . Edema   This visit occurred during the SARS-CoV-2 public health emergency.  Safety protocols were in place, including screening questions prior to the visit, additional usage of staff PPE, and extensive cleaning of exam room while observing appropriate contact time as indicated for disinfecting solutions.   Past Medical History-  Patient Active Problem List   Diagnosis Date Noted  . CML (chronic myelocytic leukemia) ([place]) 03/04/2018    Priority: High  . CKD (chronic kidney disease), stage III 03/14/2019    Priority: Medium  . Hyperglycemia 12/08/2016    Priority: Medium  . NICM (nonischemic cardiomyopathy) ([place]) 04/02/2015    Priority: Medium  . Atrial fibrillation ([place]) 09/11/2014    Priority: Medium  . Hyperlipidemia 06/23/2007    Priority: Medium  . Hx of adenomatous colonic polyps     Priority: Low  . Obesity (BMI 30-39.9) 09/19/2013    Priority: Low  . H/O calcium pyrophosphate deposition disease (CPPD) 10/16/2009    Priority: Low    Medications- reviewed and updated Current Outpatient Medications  Medication Sig Dispense Refill  . atorvastatin (LIPITOR) 10 MG tablet TAKE 1 TABLET BY MOUTH EVERY OTHER DAY. 45 tablet 3  . dasatinib (SPRYCEL) 100 MG tablet TAKE 1 TABLET (100 MG TOTAL) BY MOUTH DAILY. MAY TAKE WITH FOOD TO DECREASE STOMACH IRRITATION. 30 tablet 2  . ELIQUIS 5 MG TABS tablet TAKE 1 TABLET BY MOUTH 2 TIMES DAILY. 60 tablet 11  . fluticasone (FLONASE) 50 MCG/ACT nasal spray INSTILL 2 SPRAYS INTO BOTH NOSTRILS DAILY 16 g 6  . furosemide (LASIX) 80 MG tablet TAKE 1 TABLET BY MOUTH DAILY. 90 tablet 3  . lisinopril (ZESTRIL) 5 MG tablet TAKE 1 TABLET BY MOUTH DAILY. PLEASE KEEP UPCOMING APPT FOR FUTURE REFILLS. 90 tablet 11  . metoprolol (TOPROL-XL)  200 MG 24 hr tablet TAKE 1/2 TAB IN THE MORNING AND 1 TAB AT NIGHT. 135 tablet 3  . triamcinolone cream (KENALOG) 0.1 % Apply twice a day. For  7-10 days maximum 45 g 1   No current facility-administered medications for this visit.     Objective:  BP 126/62   Pulse (!) 112   Temp 97.6 F (36.4 C) (Temporal)   Ht 6\' 4"  (1.93 m)   Wt (!) 303 lb (137.4 kg)   SpO2 97%   BMI 36.88 kg/m  Gen: NAD, resting comfortably CV: Tachycardic and irregularly irregular  lungs: Slightly decreased breath sounds at bilateral bases Ext: 2+ edema Skin: warm, dry     Assessment and Plan  Lower Extremity Edema in a patient with nonischemic cardiomyopathy S: medication: lisinopril 5mg , Metoprolol 200Mg -150 mg total per day, Lasix 80Mg   Has had increased symptoms that started about a month ago. After having some nasal congestion that made it hard from him to lay in bed- started sleeping in an arm chair (concerning for orthopnea). In the last two weeks he has had some splitting of the skin on back of right calf- sores that busted open. Does not see as much improvement with lasix as in the past with the edema- he is using lasix 80mg  daily .   Weight up 10 lbs in the last month.  Wt Readings from Last 3 Encounters:  06/16/20 (!) 303 lb (137.4 kg)  05/07/20 293 lb 12.8 oz (  133.3 kg)  03/19/20 291 lb (132 kg)  A/P: Patient is fluid overloaded with edema, orthopnea, shortness of breath at top of stairs which is new for him.  We will increase diuresis with Lasix 80 mg twice daily until her follow-up next Wednesday.  Update CBC, CMP, TSH with labs-likely update at least a BMP next week if labs reasonably stable today  # Atrial fibrillation S: Rate controlled with  metoprolol 200 mg extended release- full at night, half in AM.  Anticoagulated with Eliquis 5 mg twice daily Patient is  followed by cardiology:  Dr. Lovena Le  A/P: Patient appropriately anticoagulated.  Appears to be in atrial fibrillation today-not  rate controlled.  I am hesitant to increase beta-blocker with a mild fluid overload he has unless cardiology recommends-I did message Dr. Lovena Le to keep him in the loop and see if he had any additional instructions  #hypertension S:  BP Readings from Last 3 Encounters:  06/16/20 126/62  05/07/20 124/61  03/19/20 100/64  A/P: Blood pressure well controlled-continue current medication  Recommended follow up: Follow-up next Wednesday morning Future Appointments  Date Time Provider [place]  06/25/2020 11:40 AM Marin Olp, MD [facility]  09/03/2020  8:00 AM [facility] None  09/03/2020  8:40 AM Truitt Merle, MD [facility] None  09/17/2020  8:00 AM Evans Lance, MD [facility]  02/04/2021  8:00 AM Hayden Pedro, MD [facility] None  03/25/2021  9:20 AM Marin Olp, MD [facility]    Lab/Order associations:   ICD-10-CM   1. NICM (nonischemic cardiomyopathy) (HCC)  I42.8 CBC With Differential/Platelet    COMPLETE METABOLIC PANEL WITH GFR    TSH    COMPLETE METABOLIC PANEL WITH GFR    CBC With Differential/Platelet    TSH  2. Persistent atrial fibrillation (HCC)  I48.19   3. Edema, unspecified type  R60.9 CBC With Differential/Platelet    COMPLETE METABOLIC PANEL WITH GFR    TSH    COMPLETE METABOLIC PANEL WITH GFR    CBC With Differential/Platelet    TSH    Meds ordered this encounter  Medications  . furosemide (LASIX) 80 MG tablet    Sig: Take 1 tablet (80 mg total) by mouth daily. If increase in weight or edema can use twice daily as instructed by physician    Dispense:  180 tablet    Refill:  3   Return precautions advised.  Garret Reddish, MD

## 2020-06-13 NOTE — Patient Instructions (Addendum)
Weight is up, swelling up, shortness of breath increased- I think you have too much fluid on board- lets try lasix twice a day (separated by at least 6 hours) and follow up next week sometime (for front desk ok to use same day slot)  Team put him in for next wednesday at 11 40 (can unblock this slot)   Please stop by lab before you go If you have mychart- we will send your results within 3 business days of [place] receiving them.  If you do not have mychart- we will call you about results within 5 business days of [place] receiving them.  *please note we are currently using [facility] which has a longer processing time than [facility] typically so labs may not come back as quickly as in the past *please also note that you will see labs on mychart as soon as they post. I will later go in and write notes on them- will say "notes from Dr. Yong Channel"  Also will likely check labs next week to make sure I didn't throw your potassium off too much with higher lasix dose.   Recommended follow up: No follow-ups on file.

## 2020-06-16 ENCOUNTER — Ambulatory Visit (INDEPENDENT_AMBULATORY_CARE_PROVIDER_SITE_OTHER): Payer: PPO | Admitting: Family Medicine

## 2020-06-16 ENCOUNTER — Encounter: Payer: Self-pay | Admitting: Family Medicine

## 2020-06-16 ENCOUNTER — Other Ambulatory Visit: Payer: Self-pay

## 2020-06-16 VITALS — BP 126/62 | HR 112 | Temp 97.6°F | Ht 76.0 in | Wt 303.0 lb

## 2020-06-16 DIAGNOSIS — I428 Other cardiomyopathies: Secondary | ICD-10-CM | POA: Diagnosis not present

## 2020-06-16 DIAGNOSIS — R609 Edema, unspecified: Secondary | ICD-10-CM

## 2020-06-16 DIAGNOSIS — I4819 Other persistent atrial fibrillation: Secondary | ICD-10-CM | POA: Diagnosis not present

## 2020-06-16 LAB — TSH: TSH: 3.5 mIU/L (ref 0.40–4.50)

## 2020-06-16 MED ORDER — FUROSEMIDE 80 MG PO TABS
80.0000 mg | ORAL_TABLET | Freq: Every day | ORAL | 3 refills | Status: DC
Start: 2020-06-16 — End: 2020-07-09

## 2020-06-16 MED FILL — FUROSEMIDE 80 MG TAB: 80 | 90 days supply | Qty: 180 | Fill #0

## 2020-06-17 LAB — CBC WITH DIFFERENTIAL/PLATELET
Absolute Monocytes: 550 cells/uL (ref 200–950)
Basophils Absolute: 19 cells/uL (ref 0–200)
Basophils Relative: 0.4 %
Eosinophils Absolute: 0 cells/uL — ABNORMAL LOW (ref 15–500)
Eosinophils Relative: 0 %
HCT: 38.2 % — ABNORMAL LOW (ref 38.5–50.0)
Hemoglobin: 12.7 g/dL — ABNORMAL LOW (ref 13.2–17.1)
Lymphs Abs: 700 cells/uL — ABNORMAL LOW (ref 850–3900)
MCH: 33 pg (ref 27.0–33.0)
MCHC: 33.2 g/dL (ref 32.0–36.0)
MCV: 99.2 fL (ref 80.0–100.0)
MPV: 9.1 fL (ref 7.5–12.5)
Monocytes Relative: 11.7 %
Neutro Abs: 3431 cells/uL (ref 1500–7800)
Neutrophils Relative %: 73 %
Platelets: 206 10*3/uL (ref 140–400)
RBC: 3.85 10*6/uL — ABNORMAL LOW (ref 4.20–5.80)
RDW: 12.9 % (ref 11.0–15.0)
Total Lymphocyte: 14.9 %
WBC: 4.7 10*3/uL (ref 3.8–10.8)

## 2020-06-17 LAB — COMPLETE METABOLIC PANEL WITH GFR
AG Ratio: 1.8 (calc) (ref 1.0–2.5)
ALT: 15 U/L (ref 9–46)
AST: 17 U/L (ref 10–35)
Albumin: 4.2 g/dL (ref 3.6–5.1)
Alkaline phosphatase (APISO): 52 U/L (ref 35–144)
BUN/Creatinine Ratio: 19 (calc) (ref 6–22)
BUN: 25 mg/dL (ref 7–25)
CO2: 32 mmol/L (ref 20–32)
Calcium: 9.5 mg/dL (ref 8.6–10.3)
Chloride: 104 mmol/L (ref 98–110)
Creat: 1.32 mg/dL — ABNORMAL HIGH (ref 0.70–1.25)
GFR, Est African American: 65 mL/min/{1.73_m2} (ref >=60)
GFR, Est Non African American: 56 mL/min/{1.73_m2} — ABNORMAL LOW (ref >=60)
Globulin: 2.3 g/dL (calc) (ref 1.9–3.7)
Glucose, Bld: 104 mg/dL — ABNORMAL HIGH (ref 65–99)
Potassium: 5 mmol/L (ref 3.5–5.3)
Sodium: 141 mmol/L (ref 135–146)
Total Bilirubin: 0.7 mg/dL (ref 0.2–1.2)
Total Protein: 6.5 g/dL (ref 6.1–8.1)

## 2020-06-18 ENCOUNTER — Telehealth: Payer: Self-pay | Admitting: Family Medicine

## 2020-06-18 NOTE — Telephone Encounter (Signed)
Patient calling back about lab result would like called back on his work phone [phone].

## 2020-06-19 MED FILL — ELIQUIS 5 MG TABLET: 5 | 30 days supply | Qty: 60 | Fill #6

## 2020-06-19 NOTE — Progress Notes (Signed)
Phone [phone] In person visit   Subjective:   [name] Aguilar is a 64 y.o. year old very pleasant male patient who presents for/with See problem oriented charting Chief Complaint  Patient presents with  . Edema    b/l lower legs     This visit occurred during the SARS-CoV-2 public health emergency.  Safety protocols were in place, including screening questions prior to the visit, additional usage of staff PPE, and extensive cleaning of exam room while observing appropriate contact time as indicated for disinfecting solutions.   Past Medical History-  Patient Active Problem List   Diagnosis Date Noted  . CML (chronic myelocytic leukemia) ([place]) 03/04/2018    Priority: High  . CKD (chronic kidney disease), stage III 03/14/2019    Priority: Medium  . Hyperglycemia 12/08/2016    Priority: Medium  . NICM (nonischemic cardiomyopathy) ([place]) 04/02/2015    Priority: Medium  . Atrial fibrillation ([place]) 09/11/2014    Priority: Medium  . Hyperlipidemia 06/23/2007    Priority: Medium  . Hx of adenomatous colonic polyps     Priority: Low  . Obesity (BMI 30-39.9) 09/19/2013    Priority: Low  . H/O calcium pyrophosphate deposition disease (CPPD) 10/16/2009    Priority: Low    Medications- reviewed and updated Current Outpatient Medications  Medication Sig Dispense Refill  . atorvastatin (LIPITOR) 10 MG tablet TAKE 1 TABLET BY MOUTH EVERY OTHER DAY. 45 tablet 3  . dasatinib (SPRYCEL) 100 MG tablet TAKE 1 TABLET (100 MG TOTAL) BY MOUTH DAILY. MAY TAKE WITH FOOD TO DECREASE STOMACH IRRITATION. 30 tablet 2  . ELIQUIS 5 MG TABS tablet TAKE 1 TABLET BY MOUTH 2 TIMES DAILY. 60 tablet 11  . fluticasone (FLONASE) 50 MCG/ACT nasal spray INSTILL 2 SPRAYS INTO BOTH NOSTRILS DAILY 16 g 6  . furosemide (LASIX) 80 MG tablet Take 1 tablet (80 mg total) by mouth daily. If increase in weight or edema can use twice daily as instructed by physician 180 tablet 3  . lisinopril (ZESTRIL) 5 MG tablet  TAKE 1 TABLET BY MOUTH DAILY. PLEASE KEEP UPCOMING APPT FOR FUTURE REFILLS. 90 tablet 11  . metoprolol (TOPROL-XL) 200 MG 24 hr tablet TAKE 1/2 TAB IN THE MORNING AND 1 TAB AT NIGHT. 135 tablet 3  . triamcinolone cream (KENALOG) 0.1 % Apply twice a day. For  7-10 days maximum 45 g 1   No current facility-administered medications for this visit.     Objective:  BP 102/68   Pulse 96   Temp 97.6 F (36.4 C) (Temporal)   Ht 6\' 4"  (1.93 m)   Wt 299 lb (135.6 kg)   SpO2 96%   BMI 36.40 kg/m  Gen: NAD, resting comfortably CV: Irregularly irregular Lungs: CTAB no crackles, wheeze, rhonchi. Improved airflow at base of lungs Ext: 1-2+ edema Skin: warm, dry     Assessment and Plan    #NICM #CKD III S: Medication:lisinopril 5 mg, metoprolol 150mg  per day and lasix 80mg  twice daily.    Edema: improved Weight gain:down 4 lbs Shortness of breath: with stairs- slight improvement Orthopnea/PND: still sleeping in arm chair  A/P: NICM- significant improvement- I think we should continue this dose lasix 80mg  BID. Check bmp and cbc today (may have had some dilutional anemia last visit).    BP low normal and asked him to let me know if has any lightheadedness with ongoing use of lasix 80mg  BID  Hoping CKD III stable- update bmp as above to assure stability- may need  some potassium supplementation as well  #A. fib-remains on Eliquis for anticoagulation.  Patient high normal but is rate controlled today-continue current dose of metoprolol-last week with high heart rate had considered higher dose but will hold off-also known to lower blood pressure further  Recommended follow up: 2 week follow up planned Future Appointments  Date Time Provider [place]  07/09/2020 11:20 AM Marin Olp, MD [facility]  09/03/2020  8:00 AM [facility] None  09/03/2020  8:40 AM Truitt Merle, MD [facility] None  09/17/2020  8:00 AM Evans Lance, MD [facility]   02/04/2021  8:00 AM Hayden Pedro, MD [facility] None  03/25/2021  9:20 AM Marin Olp, MD [facility]    Lab/Order associations:   ICD-10-CM   1. NICM (nonischemic cardiomyopathy) (HCC)  I42.8 CBC With Differential/Platelet    Basic metabolic panel    Basic metabolic panel    CBC With Differential/Platelet  2. Stage 3 chronic kidney disease, unspecified whether stage 3a or 3b CKD  N18.30   3. Persistent atrial fibrillation (HCC)  I48.19    Return precautions advised.  Garret Reddish, MD

## 2020-06-19 NOTE — Telephone Encounter (Signed)
Returned pt call and reviewed results.

## 2020-06-19 NOTE — Patient Instructions (Addendum)
Please stop by lab before you go If you have mychart- we will send your results within 3 business days of [place] receiving them.  If you do not have mychart- we will call you about results within 5 business days of [place] receiving them.  *please note we are currently using [facility] which has a longer processing time than [place] typically so labs may not come back as quickly as in the past *please also note that you will see labs on mychart as soon as they post. I will later go in and write notes on them- will say "notes from Dr. Yong Channel"   Team please schedule 07/09/20 at 11 20 AM same day slot.   Continue lasix 80mg  twice a day. Blood pressure is much lower so please let me know if there is any lightheadedness with this.

## 2020-06-24 MED FILL — SPRYCEL 100 MG TABLET: 100 | 30 days supply | Qty: 30 | Fill #0

## 2020-06-25 ENCOUNTER — Other Ambulatory Visit: Payer: Self-pay

## 2020-06-25 ENCOUNTER — Ambulatory Visit (INDEPENDENT_AMBULATORY_CARE_PROVIDER_SITE_OTHER): Payer: PPO | Admitting: Family Medicine

## 2020-06-25 ENCOUNTER — Encounter: Payer: Self-pay | Admitting: Family Medicine

## 2020-06-25 VITALS — BP 102/68 | HR 96 | Temp 97.6°F | Ht 76.0 in | Wt 299.0 lb

## 2020-06-25 DIAGNOSIS — I428 Other cardiomyopathies: Secondary | ICD-10-CM | POA: Diagnosis not present

## 2020-06-25 DIAGNOSIS — N183 Chronic kidney disease, stage 3 unspecified: Secondary | ICD-10-CM

## 2020-06-25 DIAGNOSIS — I4819 Other persistent atrial fibrillation: Secondary | ICD-10-CM

## 2020-06-26 LAB — BASIC METABOLIC PANEL
BUN/Creatinine Ratio: 20 (calc) (ref 6–22)
BUN: 26 mg/dL — ABNORMAL HIGH (ref 7–25)
CO2: 34 mmol/L — ABNORMAL HIGH (ref 20–32)
Calcium: 9.2 mg/dL (ref 8.6–10.3)
Chloride: 100 mmol/L (ref 98–110)
Creat: 1.32 mg/dL — ABNORMAL HIGH (ref 0.70–1.25)
Glucose, Bld: 96 mg/dL (ref 65–99)
Potassium: 4.9 mmol/L (ref 3.5–5.3)
Sodium: 140 mmol/L (ref 135–146)

## 2020-06-26 LAB — CBC WITH DIFFERENTIAL/PLATELET
Absolute Monocytes: 610 cells/uL (ref 200–950)
Basophils Absolute: 22 cells/uL (ref 0–200)
Basophils Relative: 0.4 %
Eosinophils Absolute: 0 cells/uL — ABNORMAL LOW (ref 15–500)
Eosinophils Relative: 0 %
HCT: 41 % (ref 38.5–50.0)
Hemoglobin: 13.5 g/dL (ref 13.2–17.1)
Lymphs Abs: 772 cells/uL — ABNORMAL LOW (ref 850–3900)
MCH: 32.4 pg (ref 27.0–33.0)
MCHC: 32.9 g/dL (ref 32.0–36.0)
MCV: 98.3 fL (ref 80.0–100.0)
MPV: 9.1 fL (ref 7.5–12.5)
Monocytes Relative: 11.3 %
Neutro Abs: 3996 cells/uL (ref 1500–7800)
Neutrophils Relative %: 74 %
Platelets: 202 10*3/uL (ref 140–400)
RBC: 4.17 10*6/uL — ABNORMAL LOW (ref 4.20–5.80)
RDW: 13.1 % (ref 11.0–15.0)
Total Lymphocyte: 14.3 %
WBC: 5.4 10*3/uL (ref 3.8–10.8)

## 2020-07-02 MED FILL — FLUTICASONE PROP 50 MCG SPR: 50 | 30 days supply | Qty: 16 | Fill #5

## 2020-07-02 MED FILL — METOPROLOL SUCCINATE ER 200: 200 | 90 days supply | Qty: 135 | Fill #2

## 2020-07-09 ENCOUNTER — Ambulatory Visit (HOSPITAL_COMMUNITY)
Admission: RE | Admit: 2020-07-09 | Discharge: 2020-07-09 | Disposition: A | Discharge status: Home / Self Care | Payer: PPO | Source: Ambulatory Visit | Attending: Family Medicine | Admitting: Family Medicine

## 2020-07-09 ENCOUNTER — Encounter: Payer: Self-pay | Admitting: Family Medicine

## 2020-07-09 ENCOUNTER — Other Ambulatory Visit: Payer: Self-pay | Admitting: Family Medicine

## 2020-07-09 ENCOUNTER — Other Ambulatory Visit: Payer: Self-pay

## 2020-07-09 ENCOUNTER — Ambulatory Visit (INDEPENDENT_AMBULATORY_CARE_PROVIDER_SITE_OTHER): Payer: PPO | Admitting: Family Medicine

## 2020-07-09 VITALS — BP 110/80 | HR 75 | Temp 98.0°F | Ht 76.0 in | Wt 299.4 lb

## 2020-07-09 DIAGNOSIS — N183 Chronic kidney disease, stage 3 unspecified: Secondary | ICD-10-CM | POA: Diagnosis not present

## 2020-07-09 DIAGNOSIS — R0602 Shortness of breath: Secondary | ICD-10-CM | POA: Diagnosis not present

## 2020-07-09 DIAGNOSIS — R609 Edema, unspecified: Secondary | ICD-10-CM | POA: Diagnosis not present

## 2020-07-09 DIAGNOSIS — J90 Pleural effusion, not elsewhere classified: Secondary | ICD-10-CM | POA: Diagnosis not present

## 2020-07-09 DIAGNOSIS — J9811 Atelectasis: Secondary | ICD-10-CM | POA: Diagnosis not present

## 2020-07-09 DIAGNOSIS — I428 Other cardiomyopathies: Secondary | ICD-10-CM | POA: Diagnosis not present

## 2020-07-09 DIAGNOSIS — I4819 Other persistent atrial fibrillation: Secondary | ICD-10-CM

## 2020-07-09 LAB — POC URINALSYSI DIPSTICK (AUTOMATED)
Bilirubin, UA: NEGATIVE
Blood, UA: NEGATIVE
Glucose, UA: NEGATIVE
Ketones, UA: NEGATIVE
Leukocytes, UA: NEGATIVE
Nitrite, UA: NEGATIVE
Protein, UA: NEGATIVE
Spec Grav, UA: 1.015 (ref 1.010–1.025)
Urobilinogen, UA: 0.2 E.U./dL
pH, UA: 7 (ref 5.0–8.0)

## 2020-07-09 MED ORDER — TORSEMIDE 20 MG PO TABS: 40.0000 mg | ORAL_TABLET | Freq: Two times a day (BID) | ORAL | 2 refills | Status: DC

## 2020-07-09 MED FILL — TORSEMIDE 20 MG TABLET: 20 | 30 days supply | Qty: 120 | Fill #0

## 2020-07-09 NOTE — Addendum Note (Signed)
Addended by: Thomes Cake on: 07/09/2020 01:55 PM   Modules accepted: Orders

## 2020-07-09 NOTE — Patient Instructions (Addendum)
Please stop by lab before you go If you have mychart- we will send your results within 3 business days of [place] receiving them.  If you do not have mychart- we will call you about results within 5 business days of [place] receiving them.  *please note we are currently using [facility] which has a longer processing time than [place] typically so labs may not come back as quickly as in the past *please also note that you will see labs on mychart as soon as they post. I will later go in and write notes on them- will say "notes from Dr. Yong Channel"  EKG today  Stop lasix 80 mg twice daily. Try torsemide 40mg  twice daily  Go over to [facility] in Main Entrance to get x-ray. Doors close at 5 for walk in patients.   Call cardiology for follow up .

## 2020-07-09 NOTE — Progress Notes (Signed)
Phone [phone] In person visit   Subjective:   [name] Aguilar is a 64 y.o. year old very pleasant male patient who presents for/with See problem oriented charting Chief Complaint  Patient presents with  . NICM   This visit occurred during the SARS-CoV-2 public health emergency.  Safety protocols were in place, including screening questions prior to the visit, additional usage of staff PPE, and extensive cleaning of exam room while observing appropriate contact time as indicated for disinfecting solutions.   Past Medical History-  Patient Active Problem List   Diagnosis Date Noted  . CML (chronic myelocytic leukemia) ([place]) 03/04/2018    Priority: High  . CKD (chronic kidney disease), stage III 03/14/2019    Priority: Medium  . Hyperglycemia 12/08/2016    Priority: Medium  . NICM (nonischemic cardiomyopathy) ([place]) 04/02/2015    Priority: Medium  . Atrial fibrillation ([place]) 09/11/2014    Priority: Medium  . Hyperlipidemia 06/23/2007    Priority: Medium  . Hx of adenomatous colonic polyps     Priority: Low  . Obesity (BMI 30-39.9) 09/19/2013    Priority: Low  . H/O calcium pyrophosphate deposition disease (CPPD) 10/16/2009    Priority: Low    Medications- reviewed and updated Current Outpatient Medications  Medication Sig Dispense Refill  . atorvastatin (LIPITOR) 10 MG tablet TAKE 1 TABLET BY MOUTH EVERY OTHER DAY. 45 tablet 3  . dasatinib (SPRYCEL) 100 MG tablet TAKE 1 TABLET (100 MG TOTAL) BY MOUTH DAILY. MAY TAKE WITH FOOD TO DECREASE STOMACH IRRITATION. 30 tablet 2  . ELIQUIS 5 MG TABS tablet TAKE 1 TABLET BY MOUTH 2 TIMES DAILY. 60 tablet 11  . fluticasone (FLONASE) 50 MCG/ACT nasal spray INSTILL 2 SPRAYS INTO BOTH NOSTRILS DAILY 16 g 6  . lisinopril (ZESTRIL) 5 MG tablet TAKE 1 TABLET BY MOUTH DAILY. PLEASE KEEP UPCOMING APPT FOR FUTURE REFILLS. 90 tablet 11  . metoprolol (TOPROL-XL) 200 MG 24 hr tablet TAKE 1/2 TAB IN THE MORNING AND 1 TAB AT NIGHT. 135 tablet 3   . triamcinolone cream (KENALOG) 0.1 % Apply twice a day. For  7-10 days maximum 45 g 1  . torsemide (DEMADEX) 20 MG tablet Take 2 tablets (40 mg total) by mouth 2 (two) times daily. 120 tablet 2   No current facility-administered medications for this visit.     Objective:  BP 110/80   Pulse 75   Temp 98 F (36.7 C) (Temporal)   Ht 6\' 4"  (1.93 m)   Wt 299 lb 6.4 oz (135.8 kg)   SpO2 92%   BMI 36.44 kg/m  Gen: NAD, resting comfortably CV: Irregularly irregular Lungs: CTAB other than very slight decrease in airflow in bilateral lung bases.  No rales or rhonchi Ext: Stable 1-2+ edema Skin: warm, dry  EKG: Atrial fibrillation with rate 93, normal axis, normal intervals, no hypertrophy, no st or t wave changes  Pending urinalysis  No results found for this or any previous visit (from the past 24 hour(s)).     Assessment and Plan    #Nonischemic cardiomyopathy/CKD stage III/shortness of breath S: Medication:Lisinopril 5 mg, metoprolol 150 mg/day, Lasix 80 mg twice daily. Urinates frequently after morning dose but not PM dose  Edema: similar from last visit Weight gain:weight stabilized from last visit Wt Readings from Last 3 Encounters:  07/09/20 299 lb 6.4 oz (135.8 kg)  06/25/20 299 lb (135.6 kg)  06/16/20 (!) 303 lb (137.4 kg)   Shortness of breath: ongoing issue and slightly worse from  last visit- such as from car to office and may have to pause with this.  Orthopnea/PND: sitting in arm chair to sleep  A/P: Patient's diuresis appears to have stalled despite ongoing Lasix 80 mg twice daily weight is stable and shortness of breath is actually slightly worsened.  EKG appears largely stable with atrial fibrillation.  We will get a chest x-ray due to shortness of breath.  Update blood work as well.  With ongoing edema check a urine to evaluate for any protein in the urine.  Checked TSH in this timeframe.  I would like for him to see cardiology again -We will try torsemide 40 mg  twice daily instead of Lasix 80 mg twice daily to see if this makes a difference -With ongoing diuresis hopefully CKD stage III stable-update CMP with labs today-stable at last visit -Atrial fibrillation is rate controlled and I do not think that is the cause of his symptoms.  In addition considered PE/DVT but with chronic Eliquis 5 mg I think probability of this occurring is very low.  Low pulse ox today could be related to atrial fibrillation -Given chronicity of symptoms do not think COVID-19 testing is indicated at this time  # GERD S: slight catch in back of throat. Has not tried tums or anything. Usually occurs during the day. Not worse with certain foods. Never had reflux before. Very mild  Medication: None A/P: Mild symptoms-Pepcid, omeprazole, Nexium, Tums all interact with the Sprycel and make it less effective so we opted out of medicine since symptoms are mild.  No reported exertional symptoms and regardless have him following up with cardiology soon  Recommended follow up: Has been following up every 2 weeks but we are going to have him see cardiology and determine next steps from there Future Appointments  Date Time Provider [place]  09/03/2020  8:00 AM [facility] None  09/03/2020  8:40 AM Truitt Merle, MD [facility] None  09/17/2020  8:00 AM Evans Lance, MD [facility]  01/28/2021  8:00 AM Hayden Pedro, MD [facility] None  03/25/2021  9:20 AM Marin Olp, MD [facility]    Lab/Order associations:   ICD-10-CM   1. Shortness of breath  R06.02 EKG 12-Lead    DG Chest 2 View    COMPLETE METABOLIC PANEL WITH GFR    B Nat Peptide    POCT Urinalysis Dipstick (Automated)    B Nat Peptide    COMPLETE METABOLIC PANEL WITH GFR  2. Edema, unspecified type  R60.9 POCT Urinalysis Dipstick (Automated)    Meds ordered this encounter  Medications  . torsemide (DEMADEX) 20 MG tablet    Sig: Take 2 tablets (40 mg total) by mouth 2 (two)  times daily.    Dispense:  120 tablet    Refill:  2     Return precautions advised.  Garret Reddish, MD

## 2020-07-10 LAB — COMPLETE METABOLIC PANEL WITH GFR
AG Ratio: 1.7 (calc) (ref 1.0–2.5)
ALT: 20 U/L (ref 9–46)
AST: 23 U/L (ref 10–35)
Albumin: 4.4 g/dL (ref 3.6–5.1)
Alkaline phosphatase (APISO): 57 U/L (ref 35–144)
BUN/Creatinine Ratio: 21 (calc) (ref 6–22)
BUN: 28 mg/dL — ABNORMAL HIGH (ref 7–25)
CO2: 33 mmol/L — ABNORMAL HIGH (ref 20–32)
Calcium: 9.4 mg/dL (ref 8.6–10.3)
Chloride: 100 mmol/L (ref 98–110)
Creat: 1.32 mg/dL — ABNORMAL HIGH (ref 0.70–1.25)
GFR, Est African American: 65 mL/min/{1.73_m2} (ref >=60)
GFR, Est Non African American: 56 mL/min/{1.73_m2} — ABNORMAL LOW (ref >=60)
Globulin: 2.6 g/dL (calc) (ref 1.9–3.7)
Glucose, Bld: 105 mg/dL — ABNORMAL HIGH (ref 65–99)
Potassium: 5.1 mmol/L (ref 3.5–5.3)
Sodium: 140 mmol/L (ref 135–146)
Total Bilirubin: 0.7 mg/dL (ref 0.2–1.2)
Total Protein: 7 g/dL (ref 6.1–8.1)

## 2020-07-10 LAB — BRAIN NATRIURETIC PEPTIDE: Brain Natriuretic Peptide: 196 pg/mL — ABNORMAL HIGH (ref <100)

## 2020-07-12 ENCOUNTER — Encounter: Payer: Self-pay | Admitting: Family Medicine

## 2020-07-16 MED FILL — ELIQUIS 5 MG TABLET: 5 | 30 days supply | Qty: 60 | Fill #7

## 2020-07-23 MED FILL — SPRYCEL 100 MG TABLET: 100 | 30 days supply | Qty: 30 | Fill #1

## 2020-08-05 NOTE — Patient Instructions (Addendum)
Continue torsemide. No changes today  We will call you within a week about your referral to cardiology and pulmonology. If you do not hear within a week, give [place] a call.   If worsening symptoms please let us know  Team- if we have egg free flu shot please provide this today

## 2020-08-05 NOTE — Progress Notes (Signed)
Phone [phone] In person visit   Subjective:   [name] Aguilar is a 64 y.o. year old very pleasant male patient who presents for/with See problem oriented charting Chief Complaint  Patient presents with  . Medication Follow Up    This visit occurred during the SARS-CoV-2 public health emergency.  Safety protocols were in place, including screening questions prior to the visit, additional usage of staff PPE, and extensive cleaning of exam room while observing appropriate contact time as indicated for disinfecting solutions.   Past Medical History-  Patient Active Problem List   Diagnosis Date Noted  . CML (chronic myelocytic leukemia) ([place]) 03/04/2018    Priority: High  . CKD (chronic kidney disease), stage III ([place]) 03/14/2019    Priority: Medium  . Hyperglycemia 12/08/2016    Priority: Medium  . NICM (nonischemic cardiomyopathy) ([place]) 04/02/2015    Priority: Medium  . Atrial fibrillation ([place]) 09/11/2014    Priority: Medium  . Hyperlipidemia 06/23/2007    Priority: Medium  . Hx of adenomatous colonic polyps     Priority: Low  . Obesity (BMI 30-39.9) 09/19/2013    Priority: Low  . H/O calcium pyrophosphate deposition disease (CPPD) 10/16/2009    Priority: Low    Medications- reviewed and updated Current Outpatient Medications  Medication Sig Dispense Refill  . atorvastatin (LIPITOR) 10 MG tablet TAKE 1 TABLET BY MOUTH EVERY OTHER DAY. 45 tablet 3  . dasatinib (SPRYCEL) 100 MG tablet TAKE 1 TABLET (100 MG TOTAL) BY MOUTH DAILY. MAY TAKE WITH FOOD TO DECREASE STOMACH IRRITATION. 30 tablet 2  . ELIQUIS 5 MG TABS tablet TAKE 1 TABLET BY MOUTH 2 TIMES DAILY. 60 tablet 11  . fluticasone (FLONASE) 50 MCG/ACT nasal spray INSTILL 2 SPRAYS INTO BOTH NOSTRILS DAILY 16 g 6  . lisinopril (ZESTRIL) 5 MG tablet TAKE 1 TABLET BY MOUTH DAILY. PLEASE KEEP UPCOMING APPT FOR FUTURE REFILLS. 90 tablet 11  . metoprolol (TOPROL-XL) 200 MG 24 hr tablet TAKE 1/2 TAB IN THE MORNING AND 1  TAB AT NIGHT. 135 tablet 3  . torsemide (DEMADEX) 20 MG tablet Take 2 tablets (40 mg total) by mouth 2 (two) times daily. 120 tablet 2  . triamcinolone cream (KENALOG) 0.1 % Apply twice a day. For  7-10 days maximum (Patient taking differently: Apply topically as needed. Apply twice a day. For  7-10 days maximum) 45 g 1   No current facility-administered medications for this visit.     Objective:  BP 128/76   Pulse 96   Temp 98.1 F (36.7 C) (Temporal)   Ht 6\' 4"  (1.93 m)   Wt (!) 300 lb 6.4 oz (136.3 kg)   SpO2 90%   BMI 36.57 kg/m  Gen: NAD, resting comfortably CV: RRR no murmurs rubs or gallops Lungs: CTAB no crackles, wheeze, rhonchi Abdomen: abdomen seems more swollen.  Ext: 1-2+ edema perhaps slightly better.  Skin: warm, dry     Assessment and Plan    #shortness of breath/NICM S: Medication: Lasix 80 mg twice daily last visit changed to torsemide 40 mg twice daily  Diuresis once again appears to have stalled. Has not seen cardiology yet.    Edema: perhaps slightly better Weight gain:Weight up 1 pound from last visit Shortness of breath: perhaps slightly better but still getting winded for example walking into the building Orthopnea/PND: still in arm chair  A/P: 64 year old male with NICM and a fib history with ongoing shortness of breath despite diuresis (has appeared fluid overloaded by  weight and edema and orthopnea but CXR and BNP not overly impressive on last check). He is compliant with torsemide 40mg  BID with perhaps mild improvement over lasix 80mg  BID. We opted to do urgent referral to cardiology and pulmonology for their insight. Strongly considered CT chest (thankfully on eliquis so doubt PE) but he is claustrophobic so we opted to start with referrals first for their opinion.  - continue current meds for now - of note CKD III has appeared stable- I dont think this is primary reason for overload. Continue to avoid nsaids - in a fib but I do not think this is  causing his symptoms -UA without leakage of protein -doubt covid testing would be positive this far into duration of symptoms- but he had a possible exposure at work (has minimal contact with her)- last possible contact on 15th  # Team- if we have egg free flu shot please provide this today    Recommended follow up: follow up for this issue depends on cardiology and pulmonology recs Future Appointments  Date Time Provider [place]  09/03/2020  8:00 AM [facility] None  09/03/2020  8:40 AM Truitt Merle, MD [facility] None  09/10/2020  9:00 AM [facility]  09/17/2020  8:00 AM Evans Lance, MD [facility]  01/28/2021  8:00 AM Hayden Pedro, MD [facility] None  03/25/2021  9:20 AM Marin Olp, MD [facility]    Lab/Order associations:   ICD-10-CM   1. Shortness of breath  R06.02 Ambulatory referral to Cardiology    Ambulatory referral to Pulmonology    CANCELED: Ambulatory referral to Cardiology    CANCELED: Ambulatory referral to Pulmonology  2. NICM (nonischemic cardiomyopathy) ([place])  I42.8 Ambulatory referral to Cardiology    CANCELED: Ambulatory referral to Cardiology  3. Encounter for screening for COVID-19  Z11.52 Novel Coronavirus, NAA ([facility])    Novel Coronavirus, NAA ([facility])  4. Persistent atrial fibrillation (HCC)  I48.19   5. Stage 3 chronic kidney disease, unspecified whether stage 3a or 3b CKD ([place])  N18.30    Return precautions advised.  Garret Reddish, MD

## 2020-08-06 ENCOUNTER — Encounter: Payer: Self-pay | Admitting: Family Medicine

## 2020-08-06 ENCOUNTER — Other Ambulatory Visit: Payer: Self-pay

## 2020-08-06 ENCOUNTER — Ambulatory Visit (INDEPENDENT_AMBULATORY_CARE_PROVIDER_SITE_OTHER): Payer: PPO | Admitting: Family Medicine

## 2020-08-06 VITALS — BP 128/76 | HR 96 | Temp 98.1°F | Ht 76.0 in | Wt 300.4 lb

## 2020-08-06 DIAGNOSIS — I428 Other cardiomyopathies: Secondary | ICD-10-CM

## 2020-08-06 DIAGNOSIS — R0602 Shortness of breath: Secondary | ICD-10-CM | POA: Diagnosis not present

## 2020-08-06 DIAGNOSIS — Z23 Encounter for immunization: Secondary | ICD-10-CM

## 2020-08-06 DIAGNOSIS — N183 Chronic kidney disease, stage 3 unspecified: Secondary | ICD-10-CM | POA: Diagnosis not present

## 2020-08-06 DIAGNOSIS — Z1152 Encounter for screening for COVID-19: Secondary | ICD-10-CM | POA: Diagnosis not present

## 2020-08-06 DIAGNOSIS — I4819 Other persistent atrial fibrillation: Secondary | ICD-10-CM | POA: Diagnosis not present

## 2020-08-06 MED FILL — LISINOPRIL 5 MG TABLET: 5 | 90 days supply | Qty: 90 | Fill #2

## 2020-08-06 MED FILL — FLUTICASONE PROP 50 MCG SPR: 50 | 30 days supply | Qty: 16 | Fill #6

## 2020-08-06 MED FILL — TORSEMIDE 20 MG TABLET: 20 | 30 days supply | Qty: 120 | Fill #1

## 2020-08-06 NOTE — Addendum Note (Signed)
Addended by: Thomes Cake on: 08/06/2020 09:47 AM   Modules accepted: Orders

## 2020-08-12 ENCOUNTER — Ambulatory Visit (INDEPENDENT_AMBULATORY_CARE_PROVIDER_SITE_OTHER): Payer: PPO | Admitting: Family Medicine

## 2020-08-12 ENCOUNTER — Other Ambulatory Visit: Payer: Self-pay

## 2020-08-12 ENCOUNTER — Encounter: Payer: Self-pay | Admitting: Family Medicine

## 2020-08-12 ENCOUNTER — Other Ambulatory Visit: Payer: Self-pay | Admitting: Family Medicine

## 2020-08-12 VITALS — BP 124/70 | HR 63 | Temp 98.1°F | Ht 76.0 in | Wt 299.2 lb

## 2020-08-12 DIAGNOSIS — I4819 Other persistent atrial fibrillation: Secondary | ICD-10-CM

## 2020-08-12 DIAGNOSIS — Z1152 Encounter for screening for COVID-19: Secondary | ICD-10-CM | POA: Diagnosis not present

## 2020-08-12 DIAGNOSIS — I428 Other cardiomyopathies: Secondary | ICD-10-CM

## 2020-08-12 DIAGNOSIS — R0609 Other forms of dyspnea: Secondary | ICD-10-CM

## 2020-08-12 DIAGNOSIS — R0600 Dyspnea, unspecified: Secondary | ICD-10-CM

## 2020-08-12 MED ORDER — ALBUTEROL SULFATE HFA 108 (90 BASE) MCG/ACT IN AERS: 2.0000 | INHALATION_SPRAY | Freq: Four times a day (QID) | RESPIRATORY_TRACT | 2 refills | Status: DC | PRN

## 2020-08-12 MED FILL — ALBUTEROL SULFATE HFA 108 (: 108 (90 BAS | 25 days supply | Qty: 9 | Fill #0

## 2020-08-12 NOTE — Progress Notes (Addendum)
Phone [phone] In person visit   Subjective:   [name] Aguilar is a 64 y.o. year old very pleasant male patient who presents for/with See problem oriented charting Chief Complaint  Patient presents with  . Passing out   This visit occurred during the SARS-CoV-2 public health emergency.  Safety protocols were in place, including screening questions prior to the visit, additional usage of staff PPE, and extensive cleaning of exam room while observing appropriate contact time as indicated for disinfecting solutions.   Past Medical History-  Patient Active Problem List   Diagnosis Date Noted  . CML (chronic myelocytic leukemia) ([place]) 03/04/2018    Priority: High  . CKD (chronic kidney disease), stage III ([place]) 03/14/2019    Priority: Medium  . Hyperglycemia 12/08/2016    Priority: Medium  . NICM (nonischemic cardiomyopathy) ([place]) 04/02/2015    Priority: Medium  . Atrial fibrillation ([place]) 09/11/2014    Priority: Medium  . Hyperlipidemia 06/23/2007    Priority: Medium  . Hx of adenomatous colonic polyps     Priority: Low  . Obesity (BMI 30-39.9) 09/19/2013    Priority: Low  . H/O calcium pyrophosphate deposition disease (CPPD) 10/16/2009    Priority: Low    Medications- reviewed and updated Current Outpatient Medications  Medication Sig Dispense Refill  . atorvastatin (LIPITOR) 10 MG tablet TAKE 1 TABLET BY MOUTH EVERY OTHER DAY. 45 tablet 3  . dasatinib (SPRYCEL) 100 MG tablet TAKE 1 TABLET (100 MG TOTAL) BY MOUTH DAILY. MAY TAKE WITH FOOD TO DECREASE STOMACH IRRITATION. 30 tablet 2  . ELIQUIS 5 MG TABS tablet TAKE 1 TABLET BY MOUTH 2 TIMES DAILY. 60 tablet 11  . fluticasone (FLONASE) 50 MCG/ACT nasal spray INSTILL 2 SPRAYS INTO BOTH NOSTRILS DAILY 16 g 6  . lisinopril (ZESTRIL) 5 MG tablet TAKE 1 TABLET BY MOUTH DAILY. PLEASE KEEP UPCOMING APPT FOR FUTURE REFILLS. 90 tablet 11  . metoprolol (TOPROL-XL) 200 MG 24 hr tablet TAKE 1/2 TAB IN THE MORNING AND 1 TAB AT NIGHT.  135 tablet 3  . torsemide (DEMADEX) 20 MG tablet Take 2 tablets (40 mg total) by mouth 2 (two) times daily. 120 tablet 2  . triamcinolone cream (KENALOG) 0.1 % Apply twice a day. For  7-10 days maximum (Patient taking differently: Apply topically as needed. Apply twice a day. For  7-10 days maximum) 45 g 1  . albuterol (VENTOLIN HFA) 108 (90 Base) MCG/ACT inhaler Inhale 2 puffs into the lungs every 6 (six) hours as needed for wheezing or shortness of breath. 1 each 2   No current facility-administered medications for this visit.     Objective:  BP 124/70   Pulse 63   Temp 98.1 F (36.7 C) (Temporal)   Ht 6\' 4"  (1.93 m)   Wt 299 lb 3.2 oz (135.7 kg)   SpO2 93%   BMI 36.42 kg/m  Gen: NAD, resting comfortably CV: Irregularly irregular with no obvious murmurs Lungs: CTAB no crackles, wheeze, rhonchi-perhaps slightly decreased breath sounds at right lung base Abdomen: Obese-patient reports increased edema in abdomen Ext: 1+ to 2+ edema stable from last visit Skin: warm, dry     Assessment and Plan   #Syncope #Dyspnea on exertion #Nonischemic cardiomyopathy S:Since he has switched torsemide feels like mucus has been thicker.   On Sunday, patient stated that he caught some mucus to the back of his throat and wasn't able to clear his throat.  He noted difficulty breathing once this plug was then placed.  The  next thing he knew he had a witnessed by wife and son syncopal episode by family and fell out of his chair onto his left shoulder.  Family was banging on his back.  Thankfully he did not hit his head.  Shoulder was sore for approximately a day but has improved. Happened quickly and was not able to motion to family that he was having trouble breathing.   This is in context of ongoing issues with shortness of breath/dyspnea on exertion despite diuresis with torsemide 40 mg twice daily.  Patient reports-There are moments where he feels asthmatic- has not had that since athletics in high  school.  I was not previously aware of asthma-like symptoms in high school. A/P: Syncope-Strongly suspect mucinous plug in airway that may have been dislodged when he landed on the ground. Faintly suspicious of pulmonary hypertension making this more likely as well.   In regards to dyspnea on exertion and patient with nonischemic cardiomyopathy-reviewed last echocardiogram and patient was noted to have moderate pulmonary hypertension.  With lack of improvement with diuresis-I would like to get an updated echocardiogram-I wonder if pulmonary hypertension could be contributing to his symptoms (wonder if patient will need right and left heart cath honestly vs other potential ischemic evaluation).  Thankfully he has cardiology follow-up in 8 days as well as pulmonary follow-up.  We are going to try albuterol given history of asthma-like symptoms in high school.  Also trial Mucinex to loosen sputum -Once again considered CT angiogram of the chest but with claustrophobia patient would like to postpone until pulmonary and cardiology evaluation.  I think the probability of chronic pulmonary embolism is low considering chronic Eliquis and excellent compliance thankfully. -Continue torsemide 40 mg twice daily -We did a test for Covid last week due to possible work exposure but unfortunately this was left in the refrigerator-we apologize to patient and retested today-very strongly doubt COVID-19 as cause of ongoing symptoms -Patient with atrial fibrillation-this appears stable/persistent and I do not suspect this is causing patient's symptoms.  Appropriately anticoagulated with Eliquis twice daily 5 mg.  Also rate controlled with metoprolol extended release 100 mg in the morning and 200 mg in the evening  Recommended follow up: Next steps plan to cardiology and pulmonology follow-up Future Appointments  Date Time Provider [place]  08/20/2020  8:15 AM Baldwin [place], PA-C [facility]   08/20/2020  4:00 PM Icard, Leory Plowman L, DO [facility] None  09/03/2020  8:00 AM [facility] None  09/03/2020  8:40 AM Truitt Merle, MD [facility] None  09/10/2020  9:00 AM [facility]  09/17/2020  8:00 AM Evans Lance, MD [facility]  01/28/2021  8:00 AM Hayden Pedro, MD [facility] None  03/25/2021  9:20 AM Marin Olp, MD [facility]    Lab/Order associations:   ICD-10-CM   1. Encounter for screening for COVID-19  Z11.52 Novel Coronavirus, NAA ([facility])    Novel Coronavirus, NAA ([facility])  2. NICM (nonischemic cardiomyopathy) ([place])  I42.8 ECHOCARDIOGRAM COMPLETE  3. DOE (dyspnea on exertion)  R06.00 ECHOCARDIOGRAM COMPLETE  4. Persistent atrial fibrillation ([facility])  I48.19     Meds ordered this encounter  Medications  . albuterol (VENTOLIN HFA) 108 (90 Base) MCG/ACT inhaler    Sig: Inhale 2 puffs into the lungs every 6 (six) hours as needed for wheezing or shortness of breath.    Dispense:  1 each    Refill:  2    Return precautions advised.  Garret Reddish,  MD

## 2020-08-12 NOTE — Patient Instructions (Addendum)
Trial albuterol if episode of tightness/wheezing  For loosening secretions lets try mucinex over the counter  If new or worsening symptoms please seek care immediately/call 911 particularly if recurrent syncope episode not related to a clear a trigger  Try to get echocardiogram done prior to cardiology visit if possible- if not  Then lets get their opinion next week

## 2020-08-13 ENCOUNTER — Other Ambulatory Visit: Payer: Self-pay

## 2020-08-13 ENCOUNTER — Ambulatory Visit (HOSPITAL_COMMUNITY): Payer: PPO | Attending: Cardiovascular Disease

## 2020-08-13 DIAGNOSIS — R0609 Other forms of dyspnea: Secondary | ICD-10-CM

## 2020-08-13 DIAGNOSIS — R0600 Dyspnea, unspecified: Secondary | ICD-10-CM | POA: Insufficient documentation

## 2020-08-13 DIAGNOSIS — I428 Other cardiomyopathies: Secondary | ICD-10-CM | POA: Diagnosis not present

## 2020-08-13 LAB — ECHOCARDIOGRAM COMPLETE: S' Lateral: 4 cm

## 2020-08-13 LAB — SARS-COV-2, NAA 2 DAY TAT

## 2020-08-13 LAB — NOVEL CORONAVIRUS, NAA: SARS-CoV-2, NAA: NOT DETECTED

## 2020-08-13 MED ORDER — PERFLUTREN LIPID MICROSPHERE
1.0000 mL | INTRAVENOUS | Status: AC | PRN
  Administered 2020-08-13: 2 mL via INTRAVENOUS

## 2020-08-14 MED FILL — ELIQUIS 5 MG TABLET: 5 | 30 days supply | Qty: 60 | Fill #8

## 2020-08-15 ENCOUNTER — Encounter (HOSPITAL_COMMUNITY): Payer: Self-pay

## 2020-08-15 ENCOUNTER — Ambulatory Visit: Payer: PPO | Admitting: Family Medicine

## 2020-08-15 ENCOUNTER — Emergency Department (HOSPITAL_COMMUNITY)
Admission: EM | Admit: 2020-08-15 | Discharge: 2020-08-18 | Disposition: E | Payer: PPO | Attending: Emergency Medicine | Admitting: Emergency Medicine

## 2020-08-15 DIAGNOSIS — R0902 Hypoxemia: Secondary | ICD-10-CM | POA: Diagnosis not present

## 2020-08-15 DIAGNOSIS — Z7901 Long term (current) use of anticoagulants: Secondary | ICD-10-CM | POA: Diagnosis not present

## 2020-08-15 DIAGNOSIS — E1165 Type 2 diabetes mellitus with hyperglycemia: Secondary | ICD-10-CM | POA: Diagnosis not present

## 2020-08-15 DIAGNOSIS — I469 Cardiac arrest, cause unspecified: Secondary | ICD-10-CM | POA: Diagnosis not present

## 2020-08-15 DIAGNOSIS — Z79899 Other long term (current) drug therapy: Secondary | ICD-10-CM | POA: Diagnosis not present

## 2020-08-15 DIAGNOSIS — I129 Hypertensive chronic kidney disease with stage 1 through stage 4 chronic kidney disease, or unspecified chronic kidney disease: Secondary | ICD-10-CM | POA: Insufficient documentation

## 2020-08-15 DIAGNOSIS — N183 Chronic kidney disease, stage 3 unspecified: Secondary | ICD-10-CM | POA: Insufficient documentation

## 2020-08-15 DIAGNOSIS — R404 Transient alteration of awareness: Secondary | ICD-10-CM | POA: Diagnosis not present

## 2020-08-15 DIAGNOSIS — J45909 Unspecified asthma, uncomplicated: Secondary | ICD-10-CM | POA: Diagnosis not present

## 2020-08-15 DIAGNOSIS — I499 Cardiac arrhythmia, unspecified: Secondary | ICD-10-CM | POA: Diagnosis not present

## 2020-08-15 DIAGNOSIS — R0602 Shortness of breath: Secondary | ICD-10-CM | POA: Diagnosis not present

## 2020-08-15 HISTORY — DX: Unspecified asthma, uncomplicated: J45.909

## 2020-08-15 MED ORDER — EPINEPHRINE 1 MG/10ML IJ SOSY
PREFILLED_SYRINGE | INTRAMUSCULAR | Status: AC | PRN
  Administered 2020-08-15: 1 mg via INTRAVENOUS

## 2020-08-18 NOTE — Code Documentation (Signed)
Patient time of death occurred at 38

## 2020-08-18 NOTE — Code Documentation (Signed)
Pulse check, PEA, time of death at 26

## 2020-08-18 NOTE — Code Documentation (Signed)
Pulse check, PEA, compressions continued

## 2020-08-18 NOTE — ED Triage Notes (Signed)
Pt comes via [facility], called out for resp distress, has had increased SOB for the past few weeks, hx of asthma, upon EMS arrival oxygen 50% on RA, pt has respiratory arrest with EMS, went into PEA, total of 4 epi given, downtime at 1904.

## 2020-08-18 NOTE — ED Provider Notes (Signed)
[facility] EMERGENCY DEPARTMENT Provider Note   CSN: [id number] Arrival date & time: 2020/09/05  1934     History Chief Complaint  Patient presents with  . Cardiac Arrest    [name] Aguilar is a 64 y.o. male. Level 5 caveat due to cardiac arrest. HPI Patient brought in in cardiac arrest.  Reportedly called out for shortness of breath the house.  Found to have oxygen saturations of 50%.  Had respiratory event cardiac arrest for EMS.  Lost vitals at 53.  Recently being treated for his shortness of breath.  Reportedly would not tolerate CT scan due to claustrophobia.  Does have history of nonischemic cardiomyopathy and atrial fibrillation.  Is on anticoagulation.    Past Medical History:  Diagnosis Date  . Asthma   . Atrial fibrillation ([place])   . Diverticulosis   . Hx of adenomatous colonic polyps   . Hyperlipidemia   . Hypertension   . Vitreous anomalies 2014   Tack down of vitreous tear    Patient Active Problem List   Diagnosis Date Noted  . CKD (chronic kidney disease), stage III ([place]) 03/14/2019  . CML (chronic myelocytic leukemia) ([place]) 03/04/2018  . Hyperglycemia 12/08/2016  . Hx of adenomatous colonic polyps   . NICM (nonischemic cardiomyopathy) ([place]) 04/02/2015  . Atrial fibrillation ([place]) 09/11/2014  . Obesity (BMI 30-39.9) 09/19/2013  . H/O calcium pyrophosphate deposition disease (CPPD) 10/16/2009  . Hyperlipidemia 06/23/2007    Past Surgical History:  Procedure Laterality Date  . CARDIAC CATHETERIZATION N/A 04/30/2015   Procedure: Left Heart Cath and Coronary Angiography;  Surgeon: Jerline Pain, MD;  Location: [facility];  Service: Cardiovascular;  Laterality: N/A;  . CARDIOVERSION N/A 11/20/2014   Procedure: CARDIOVERSION;  Surgeon: Pixie Casino, MD;  Location: [facility] ENDOSCOPY;  Service: Cardiovascular;  Laterality: N/A;  11:38 Etomidate given IV for unsuccessful synched cardioversion @ 150 joules from Afib, 11:39 repeated  synched cardioversion @ 200 joules successfully from Afib to SR. 12 lead EKG ordered to confirm..  . COLONOSCOPY    . COLONOSCOPY N/A 12/24/2015   Procedure: COLONOSCOPY ( ANAPHALAXIS ALLERGY TO EGGS);  Surgeon: Gatha Mayer, MD;  Location: [facility] END[facility];  Service: Endoscopy;  Laterality: N/A;  anaphalaxis allergy to eggs  . EYE SURGERY  2014       Family History  Problem Relation Age of Onset  . Asthma Mother   . Alzheimer's disease Mother        diagnosed 28  . Brain cancer Father        not genetic  . Other Brother        gilbert disease  . Heart attack Maternal Grandmother   . Heart attack Maternal Uncle   . Colon cancer Neg Hx   . Esophageal cancer Neg Hx   . Pancreatic cancer Neg Hx   . Liver disease Neg Hx     Social History   Tobacco Use  . Smoking status: Never Smoker  . Smokeless tobacco: Never Used  Vaping Use  . Vaping Use: Never used  Substance Use Topics  . Alcohol use: Yes    Alcohol/week: 0.0 standard drinks    Comment: 1 drink once a month max  . Drug use: No    Home Medications Prior to Admission medications   Medication Sig Start Date End Date Taking? Authorizing Provider  albuterol (VENTOLIN HFA) 108 (90 Base) MCG/ACT inhaler Inhale 2 puffs into the lungs every 6 (six) hours as needed for  wheezing or shortness of breath. 08/12/20   Marin Olp, MD  atorvastatin (LIPITOR) 10 MG tablet TAKE 1 TABLET BY MOUTH EVERY OTHER DAY. 03/11/20   Evans Lance, MD  dasatinib (SPRYCEL) 100 MG tablet TAKE 1 TABLET (100 MG TOTAL) BY MOUTH DAILY. MAY TAKE WITH FOOD TO DECREASE STOMACH IRRITATION. 06/13/20   Truitt Merle, MD  ELIQUIS 5 MG TABS tablet TAKE 1 TABLET BY MOUTH 2 TIMES DAILY. 12/19/19   Marin Olp, MD  fluticasone Asencion Islam) 50 MCG/ACT nasal spray INSTILL 2 SPRAYS INTO BOTH NOSTRILS DAILY 12/12/19   Marin Olp, MD  lisinopril (ZESTRIL) 5 MG tablet TAKE 1 TABLET BY MOUTH DAILY. PLEASE KEEP UPCOMING APPT FOR FUTURE REFILLS. 12/19/19   Marin Olp, MD  metoprolol (TOPROL-XL) 200 MG 24 hr tablet TAKE 1/2 TAB IN THE MORNING AND 1 TAB AT NIGHT. 12/20/19   Evans Lance, MD  torsemide (DEMADEX) 20 MG tablet Take 2 tablets (40 mg total) by mouth 2 (two) times daily. 07/09/20   Marin Olp, MD  triamcinolone cream (KENALOG) 0.1 % Apply twice a day. For  7-10 days maximum Patient taking differently: Apply topically as needed. Apply twice a day. For  7-10 days maximum 03/19/20   Marin Olp, MD    Allergies    Eggs or egg-derived products  Review of Systems   Review of Systems  Unable to perform ROS: Patient unresponsive    Physical Exam Updated Vital Signs BP (!) 0/0   Pulse (!) 21   Temp (!) 95 F (35 C)   Resp (!) 0   Ht 6\' 4"  (1.93 m)   Wt 108.9 kg   SpO2 (!) 62%   BMI 29.21 kg/m   Physical Exam Vitals and nursing note reviewed.  Constitutional:      Comments: Unresponsive.  HENT:     Head: Atraumatic.  Eyes:     Comments: Pupils unresponsive.  Neck:     Comments: King airway in place. Cardiovascular:     Comments: No pulse. Pulmonary:     Comments:   Equal breath sounds bilaterally with bagging. Abdominal:     Tenderness: There is no abdominal tenderness.  Musculoskeletal:        General: No tenderness.  Skin:    General: Skin is warm.  Neurological:     Comments: Unresponsive.     ED Results / Procedures / Treatments   Labs (all labs ordered are listed, but only abnormal results are displayed) Labs Reviewed - No data to display  EKG None  Radiology No results found.  Procedures Procedures (including critical care time)  Medications Ordered in ED Medications  EPINEPHrine (ADRENALIN) 1 MG/10ML injection (1 mg Intravenous Given 09/09/2020 1933)    ED Course  I have reviewed the triage vital signs and the nursing notes.  Pertinent labs & imaging results that were available during my care of the patient were reviewed by me and considered in my medical decision making (see  chart for details).    MDM Rules/Calculators/A&P                          Patient presents in cardiac arrest.  Had around 30 minutes of CPR/asystole.  Upon arrival to the ER.  Equal breath sounds equal so pneumothorax felt less likely.  Sugar okay for EMS.  No breath and no corneal reflex.  Time of death 45.  Discussed with medical examiner and states  not medical examiner case.  Patient's PCP hopefully can sign the death circuit.  Discussed the death of the patient's wife who came into see the patient. Final Clinical Impression(s) / ED Diagnoses Final diagnoses:  Cardiac arrest [facility])    Rx / [place] Orders ED Discharge Orders    None       Davonna Belling, MD 08-23-20 [id number]

## 2020-08-18 NOTE — Code Documentation (Signed)
Pulse check, asystole, compressions continued

## 2020-08-18 NOTE — Consult Note (Signed)
Responded to call, pt had just passed, sat w/ wife bedside, providing spiritual/emotional/grief support/presence,hugs, and prayer. Family is [place]. Left and retrieved prayer shawl for pregnant daughter, whom wife thought should receive it, as she had rec'd one when her mother died. Daughter and her husband arrived from [place]. Prayed prayer shawl prayers with them, which they appreciated. Gave wife pt placement card. Wife had retrieved her husband's wedding ring and watch. Told nurse re: last 2 items, and that family was preparing to leave.   Rev. Eloise Levels Chaplain

## 2020-08-18 DEATH — deceased

## 2020-08-20 ENCOUNTER — Ambulatory Visit: Payer: PPO | Admitting: Physician Assistant

## 2020-08-20 ENCOUNTER — Institutional Professional Consult (permissible substitution): Payer: PPO | Admitting: Pulmonary Disease

## 2020-09-03 ENCOUNTER — Other Ambulatory Visit: Payer: PPO

## 2020-09-03 ENCOUNTER — Ambulatory Visit: Payer: PPO | Admitting: Hematology

## 2020-09-10 ENCOUNTER — Ambulatory Visit: Payer: PPO

## 2020-09-17 ENCOUNTER — Ambulatory Visit: Payer: PPO | Admitting: Internal Medicine

## 2021-01-09 ENCOUNTER — Other Ambulatory Visit (HOSPITAL_COMMUNITY): Payer: Self-pay

## 2021-01-28 ENCOUNTER — Encounter (INDEPENDENT_AMBULATORY_CARE_PROVIDER_SITE_OTHER): Payer: PPO | Admitting: Ophthalmology

## 2021-02-04 ENCOUNTER — Encounter (INDEPENDENT_AMBULATORY_CARE_PROVIDER_SITE_OTHER): Payer: PPO | Admitting: Ophthalmology

## 2021-03-25 ENCOUNTER — Encounter: Payer: PPO | Admitting: Family Medicine

## 2021-04-21 IMAGING — DX DG CHEST 2V
2 series · 2 of 2 positions shown · non-contrast
Comparison: None.

CLINICAL DATA: Shortness of breath.

EXAM:
CHEST - 2 VIEW

[chest pa]
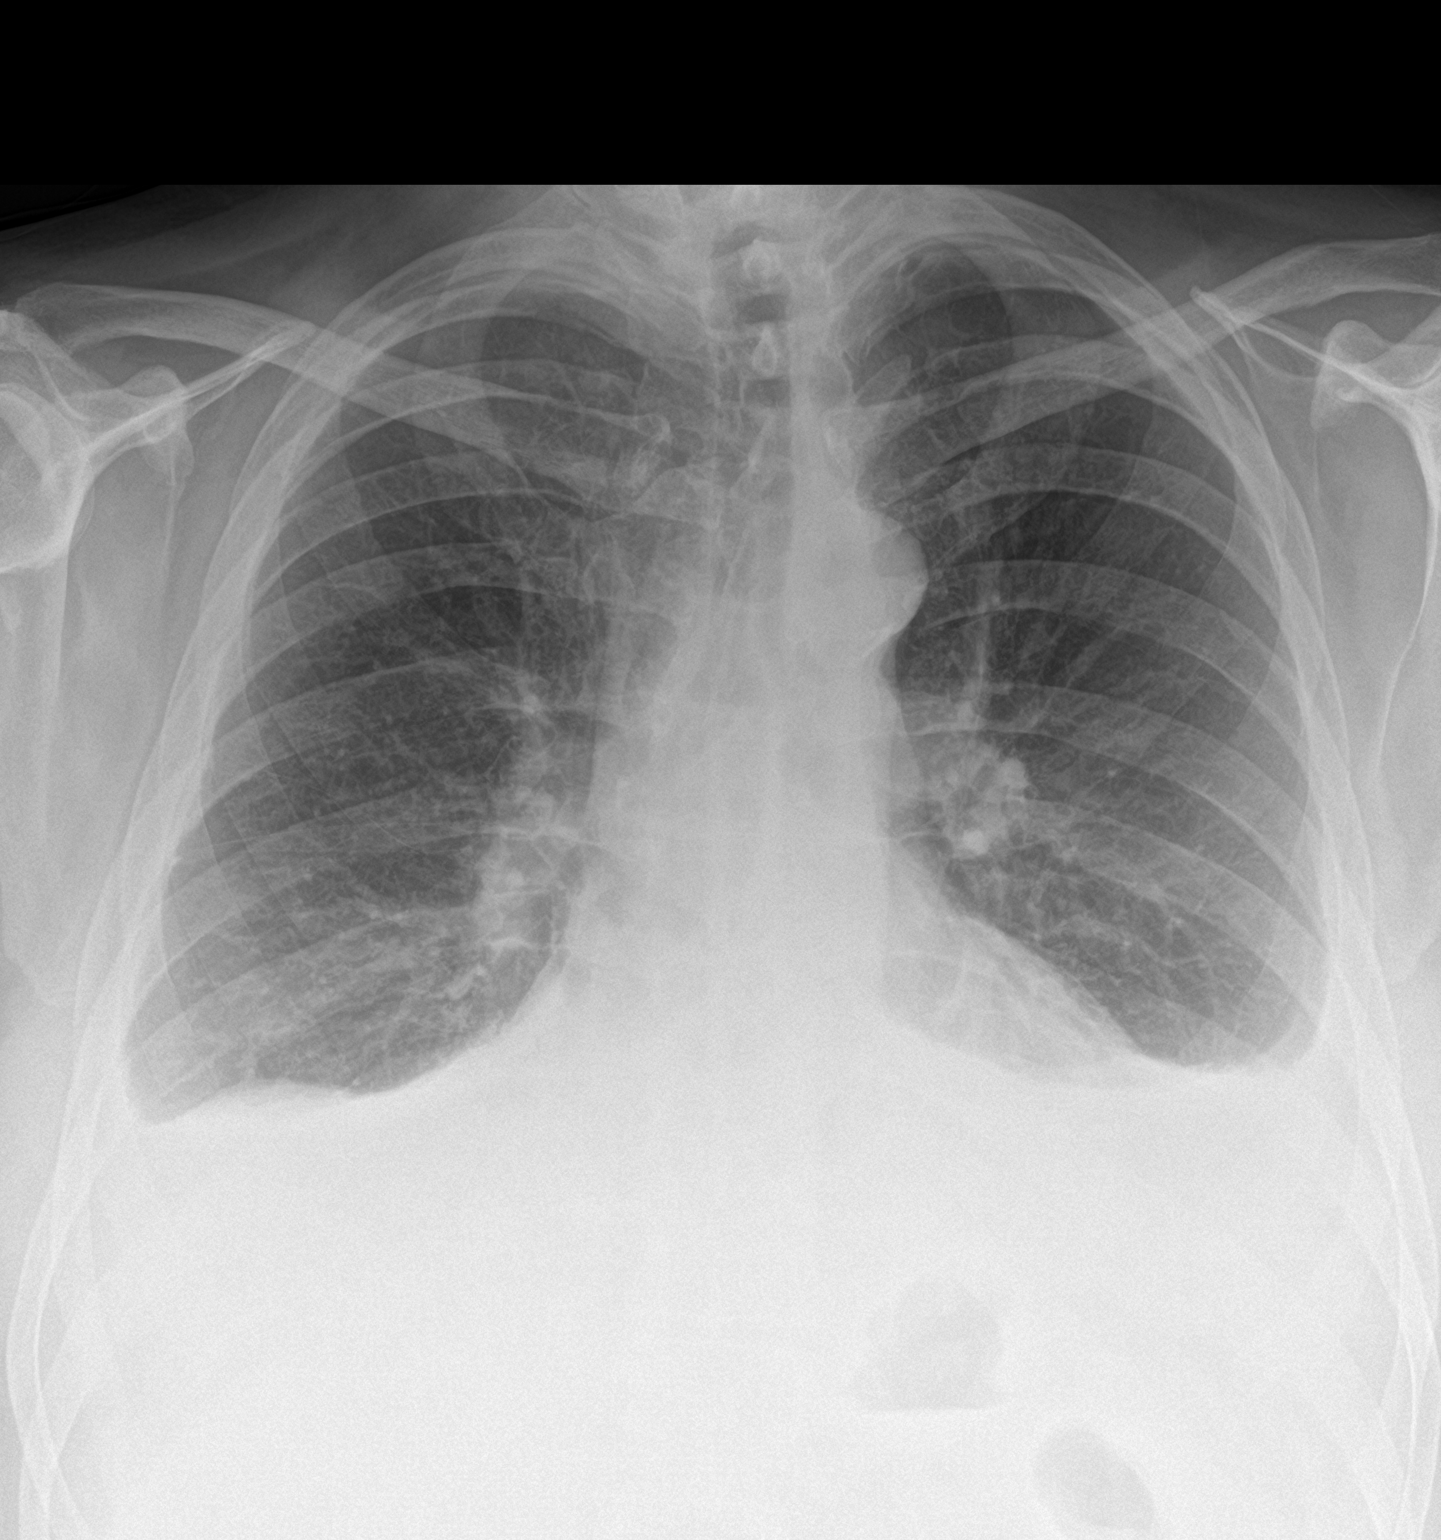

[chest lat]
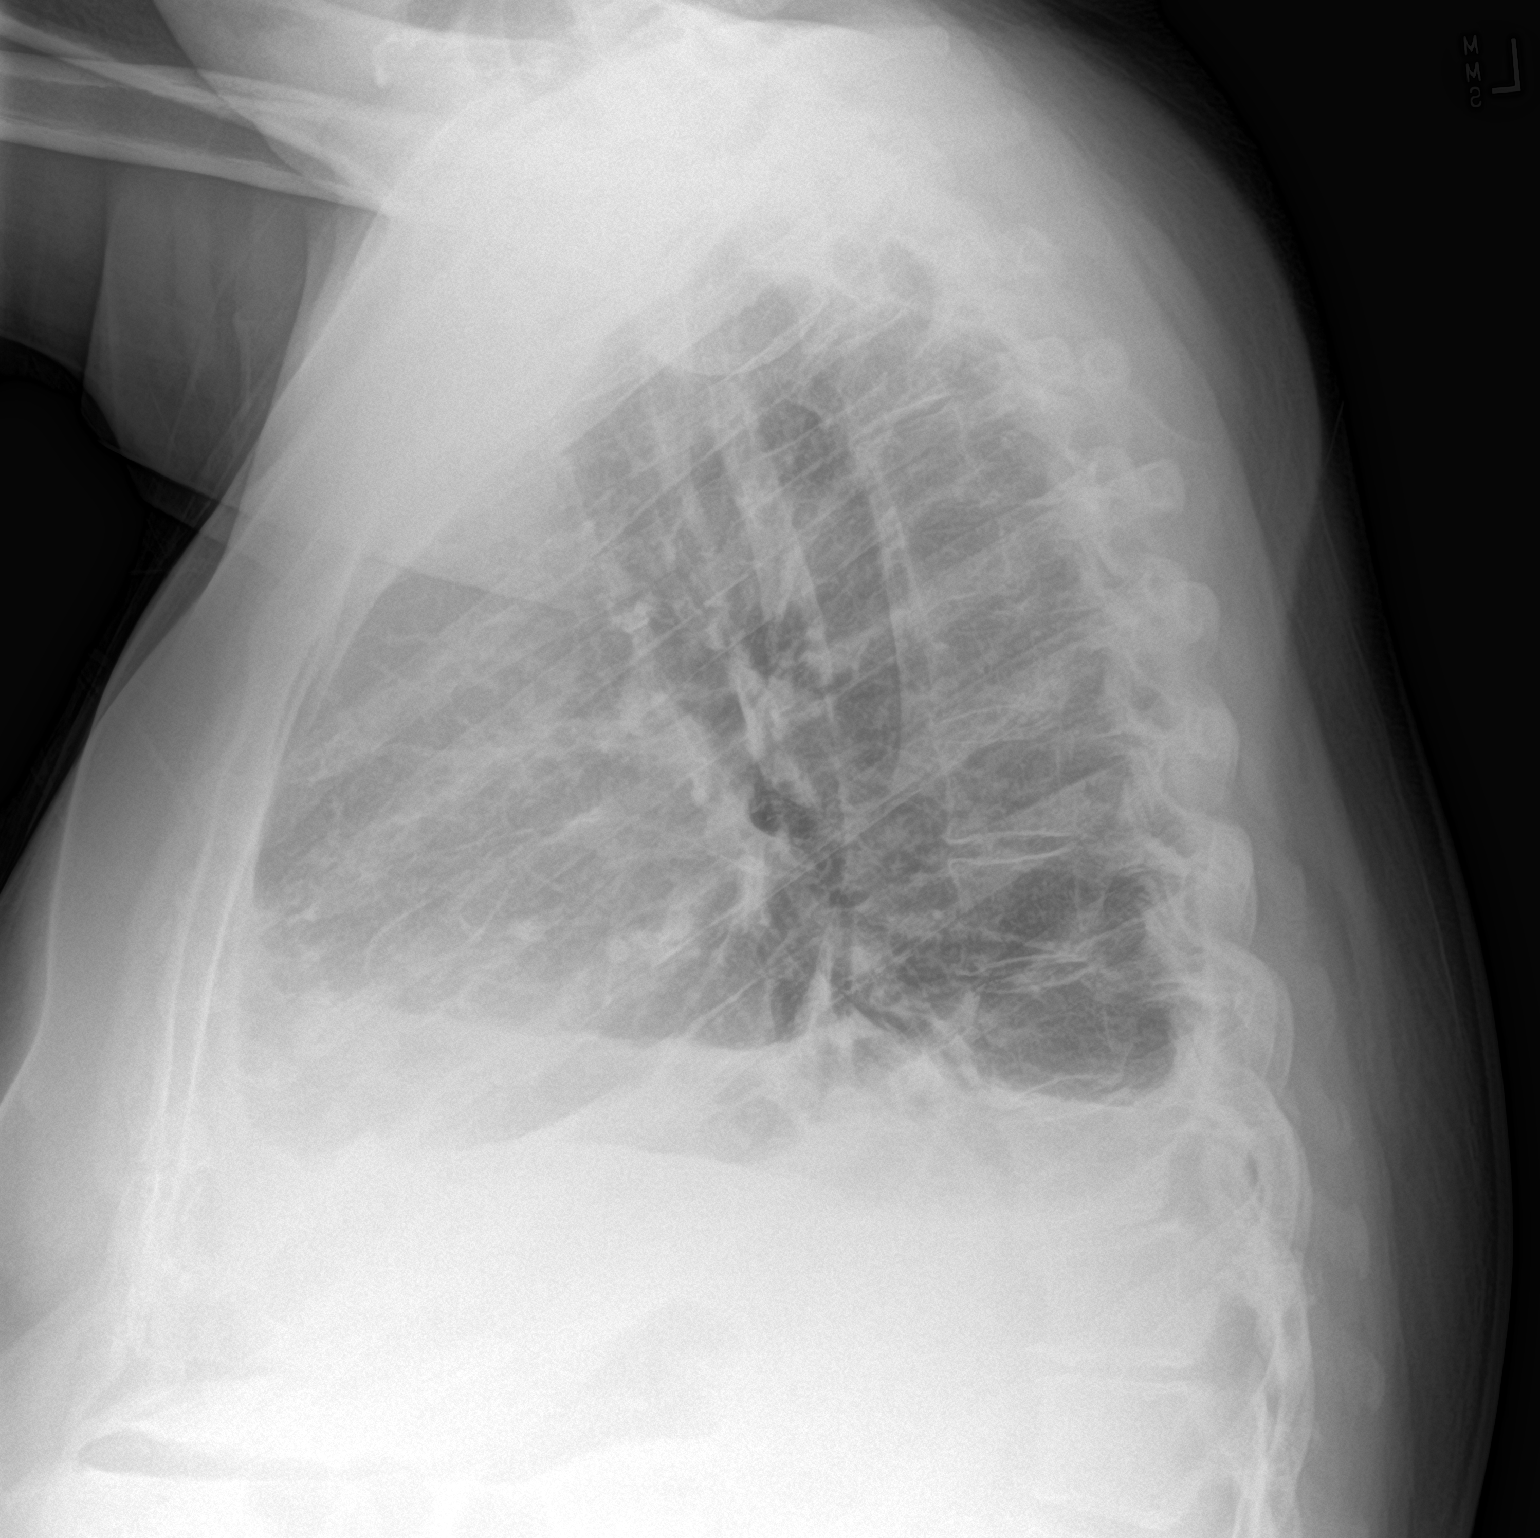

[2 of 2 positions shown; findings below may reference images not displayed]

FINDINGS: Lungs are adequately inflated demonstrate small bilateral pleural
effusions likely with associated bibasilar atelectasis.
Cardiomediastinal silhouette is unremarkable. Mild degenerate change
of the spine.
IMPRESSION: Small bilateral pleural effusions likely with associated bibasilar
atelectasis.
